# Patient Record
Sex: Male | Born: 1975
Health system: Southern US, Community
[De-identification: ages and names within clinical notes are randomized; demographics above are authoritative.]

## PROBLEM LIST (undated history)

## (undated) ENCOUNTER — Emergency Department (HOSPITAL_COMMUNITY): Disposition: A | Discharge status: Home / Self Care | Payer: Self-pay

## (undated) DIAGNOSIS — J40 Bronchitis, not specified as acute or chronic: Secondary | ICD-10-CM

## (undated) DIAGNOSIS — I10 Essential (primary) hypertension: Secondary | ICD-10-CM

## (undated) DIAGNOSIS — I214 Non-ST elevation (NSTEMI) myocardial infarction: Secondary | ICD-10-CM

## (undated) HISTORY — PX: DENTAL SURGERY: SHX609

---

## 2009-08-31 ENCOUNTER — Emergency Department (HOSPITAL_COMMUNITY): Admission: EM | Admit: 2009-08-31 | Discharge: 2009-08-31 | Payer: Self-pay | Admitting: Family Medicine

## 2010-04-17 ENCOUNTER — Emergency Department (HOSPITAL_COMMUNITY): Admission: EM | Admit: 2010-04-17 | Discharge: 2010-04-17 | Payer: Self-pay | Admitting: Family Medicine

## 2012-09-30 ENCOUNTER — Encounter (HOSPITAL_COMMUNITY): Payer: Self-pay | Admitting: Emergency Medicine

## 2012-09-30 ENCOUNTER — Emergency Department (HOSPITAL_COMMUNITY)
Admission: EM | Admit: 2012-09-30 | Discharge: 2012-10-01 | Disposition: A | Discharge status: Home / Self Care | Payer: Self-pay | Attending: Emergency Medicine | Admitting: Emergency Medicine

## 2012-09-30 DIAGNOSIS — Z79899 Other long term (current) drug therapy: Secondary | ICD-10-CM | POA: Insufficient documentation

## 2012-09-30 DIAGNOSIS — F17200 Nicotine dependence, unspecified, uncomplicated: Secondary | ICD-10-CM | POA: Insufficient documentation

## 2012-09-30 DIAGNOSIS — R0789 Other chest pain: Secondary | ICD-10-CM | POA: Insufficient documentation

## 2012-09-30 DIAGNOSIS — I10 Essential (primary) hypertension: Secondary | ICD-10-CM | POA: Insufficient documentation

## 2012-09-30 DIAGNOSIS — R0781 Pleurodynia: Secondary | ICD-10-CM

## 2012-09-30 HISTORY — DX: Essential (primary) hypertension: I10

## 2012-09-30 NOTE — ED Notes (Signed)
Pt c/o pain to L ribs x 1 month, intermittent, worse with deep breath, movement. Worse with palpation. Pt states he was able to palpate a "knot" this evening in rib area. Pt denies injury.

## 2012-10-01 ENCOUNTER — Emergency Department (HOSPITAL_COMMUNITY): Payer: Self-pay

## 2012-10-01 MED ORDER — NAPROXEN 500 MG PO TABS
500.0000 mg | ORAL_TABLET | Freq: Once | ORAL | Status: AC
  Administered 2012-10-01: 500 mg via ORAL
  Filled 2012-10-01: qty 1

## 2012-10-01 MED ORDER — NAPROXEN 500 MG PO TABS: 500.0000 mg | ORAL_TABLET | Freq: Two times a day (BID) | ORAL | Status: DC

## 2012-10-01 NOTE — ED Provider Notes (Signed)
History     CSN: [id number]  Arrival date & time 09/30/12  2331   First MD Initiated Contact with Patient 09/30/12 2357      Chief Complaint  Patient presents with  . Rib pain     (Consider location/radiation/quality/duration/timing/severity/associated sxs/prior treatment) HPI Comments: 36 year old male with history of hypertension presents with a complaint of sharp left rib pain. Onset was one month ago, has been persistent throughout the month though it is intermittent throughout the day. It is worse with deep breathing, worse with rotation at the hips and flexion at the hips, worse with palpation of the chest wall but not associated with coughing, trauma, fever, swelling of the legs or any other complaints. He denies a history of diabetes, cancer, DVT, HIV, steroid use, trauma.  The history is provided by the patient and a relative.    Past Medical History  Diagnosis Date  . Hypertension     History reviewed. No pertinent past surgical history.  No family history on file.  History  Substance Use Topics  . Smoking status: Current Every Day Smoker  . Smokeless tobacco: Not on file  . Alcohol Use: No      Review of Systems  All other systems reviewed and are negative.    Allergies  Review of patient's allergies indicates no known allergies.  Home Medications   Current Outpatient Rx  Name  Route  Sig  Dispense  Refill  . CLONIDINE HCL 0.2 MG PO TABS   Oral   Take 0.2 mg by mouth 2 (two) times daily.         Marland Kitchen DILTIAZEM HCL ER 240 MG PO CP24   Oral   Take 240 mg by mouth daily.         Marland Kitchen HYDROCHLOROTHIAZIDE 25 MG PO TABS   Oral   Take 25 mg by mouth daily.         Marland Kitchen LISINOPRIL 20 MG PO TABS   Oral   Take 20 mg by mouth daily.         Marland Kitchen NAPROXEN 500 MG PO TABS   Oral   Take 1 tablet (500 mg total) by mouth 2 (two) times daily with a meal.   30 tablet   0     BP 161/104  Pulse 91  Temp 98.2 F (36.8 C) (Oral)  Resp 18  SpO2  99%  Physical Exam  Nursing note and vitals reviewed. Constitutional: He appears well-developed and well-nourished. No distress.  HENT:  Head: Normocephalic and atraumatic.  Mouth/Throat: Oropharynx is clear and moist. No oropharyngeal exudate.  Eyes: Conjunctivae normal and EOM are normal. Pupils are equal, round, and reactive to light. Right eye exhibits no discharge. Left eye exhibits no discharge. No scleral icterus.  Neck: Normal range of motion. Neck supple. No JVD present. No thyromegaly present.  Cardiovascular: Normal rate, regular rhythm, normal heart sounds and intact distal pulses.  Exam reveals no gallop and no friction rub.   No murmur heard. Pulmonary/Chest: Effort normal and breath sounds normal. No respiratory distress. He has no wheezes. He has no rales. He exhibits tenderness ( Reproducible chest wall tenderness over the anterior axillary line around the fifth rib, this extends anteriorly, no crepitance, no bruising, no rashes).  Abdominal: Soft. Bowel sounds are normal. He exhibits no distension and no mass. There is no tenderness.  Musculoskeletal: Normal range of motion. He exhibits no edema and no tenderness.  Lymphadenopathy:    He has no cervical adenopathy.  Neurological: He is alert. Coordination normal.  Skin: Skin is warm and dry. No rash noted. No erythema.  Psychiatric: He has a normal mood and affect. His behavior is normal.    ED Course  Procedures (including critical care time)  Labs Reviewed - No data to display Dg Ribs Unilateral W/chest Left  10/01/2012  *RADIOLOGY REPORT*  Clinical Data: Left rib pain.  LEFT RIBS AND CHEST - 3+ VIEW  Comparison: None.  Findings:  Cardiopericardial silhouette within normal limits. Mediastinal contours normal. Trachea midline.  No airspace disease or effusion.  No displaced rib fracture.  Cutaneous marker placed over the left lateral tenth rib.  No abnormality is present in this region.  IMPRESSION: Negative.   Original  Report Authenticated By: Andreas Newport, M.D.      1. Rib pain on left side       MDM  Overall the patient is well-appearing, he has hypertension but no signs of hypoxia or tachycardia. We'll admit the ribs to rule out fracture or cancerous lesions. Naprosyn given, patient declines parenteral medications.  The patient has had negative x-rays, no signs of fracture, no pneumonia, patient appears stable for discharge. He has medications for his hypertension including diltiazem, hydrochlorothiazide, lisinopril and clonidine.  Will add Naprosyn for musculoskeletal pain.   Vida Roller, MD 10/01/12 [phone]

## 2012-10-01 NOTE — ED Notes (Signed)
Patient transported to X-ray 

## 2013-06-23 ENCOUNTER — Encounter (HOSPITAL_COMMUNITY): Payer: Self-pay | Admitting: Emergency Medicine

## 2013-06-23 ENCOUNTER — Emergency Department (INDEPENDENT_AMBULATORY_CARE_PROVIDER_SITE_OTHER): Payer: Self-pay

## 2013-06-23 ENCOUNTER — Emergency Department (INDEPENDENT_AMBULATORY_CARE_PROVIDER_SITE_OTHER)
Admission: EM | Admit: 2013-06-23 | Discharge: 2013-06-23 | Disposition: A | Discharge status: Home / Self Care | Payer: Self-pay | Source: Home / Self Care | Attending: Family Medicine | Admitting: Family Medicine

## 2013-06-23 DIAGNOSIS — S8010XA Contusion of unspecified lower leg, initial encounter: Secondary | ICD-10-CM

## 2013-06-23 DIAGNOSIS — S8011XA Contusion of right lower leg, initial encounter: Secondary | ICD-10-CM

## 2013-06-23 MED ORDER — TRAMADOL HCL 50 MG PO TABS: 50.0000 mg | ORAL_TABLET | Freq: Four times a day (QID) | ORAL | Status: DC | PRN

## 2013-06-23 MED ORDER — CEPHALEXIN 500 MG PO CAPS: 500.0000 mg | ORAL_CAPSULE | Freq: Two times a day (BID) | ORAL | Status: DC

## 2013-06-23 MED ORDER — TRIAMCINOLONE ACETONIDE 0.5 % EX OINT: TOPICAL_OINTMENT | Freq: Two times a day (BID) | CUTANEOUS | Status: DC

## 2013-06-23 NOTE — ED Provider Notes (Signed)
CSN: [id number]     Arrival date & time 06/23/13  0902 History   First MD Initiated Contact with Patient 06/23/13 [phone]     Chief Complaint  Patient presents with  . Leg Swelling   (Consider location/radiation/quality/duration/timing/severity/associated sxs/prior Treatment) HPI Comments: 37 year old male smoker. Here complaining of  in the area of swelling, itchiness and pain in his anterior lower right leg. States symptoms have been present for 2 weeks. Has used hydrocortisone for itchiness. Denies known trauma. No drainage.    Past Medical History  Diagnosis Date  . Hypertension    History reviewed. No pertinent past surgical history. History reviewed. No pertinent family history. History  Substance Use Topics  . Smoking status: Current Every Day Smoker  . Smokeless tobacco: Not on file  . Alcohol Use: No    Review of Systems  Constitutional: Negative for fever, chills and fatigue.  Skin:       As per HPI    Allergies  Review of patient's allergies indicates no known allergies.  Home Medications   Current Outpatient Rx  Name  Route  Sig  Dispense  Refill  . hydrochlorothiazide (HYDRODIURIL) 25 MG tablet   Oral   Take 25 mg by mouth daily.         Marland Kitchen lisinopril (PRINIVIL,ZESTRIL) 20 MG tablet   Oral   Take 20 mg by mouth daily.         . cephALEXin (KEFLEX) 500 MG capsule   Oral   Take 1 capsule (500 mg total) by mouth 2 (two) times daily.   14 capsule   0   . cloNIDine (CATAPRES) 0.2 MG tablet   Oral   Take 0.2 mg by mouth 2 (two) times daily.         Marland Kitchen diltiazem (DILACOR XR) 240 MG 24 hr capsule   Oral   Take 240 mg by mouth daily.         . naproxen (NAPROSYN) 500 MG tablet   Oral   Take 1 tablet (500 mg total) by mouth 2 (two) times daily with a meal.   30 tablet   0   . traMADol (ULTRAM) 50 MG tablet   Oral   Take 1 tablet (50 mg total) by mouth every 6 (six) hours as needed for pain.   15 tablet   0   . triamcinolone ointment  (KENALOG) 0.5 %   Topical   Apply topically 2 (two) times daily.   30 g   0    BP 154/104  Pulse 87  Temp(Src) 98.2 F (36.8 C) (Oral)  Resp 18  SpO2 96% Physical Exam  Nursing note and vitals reviewed. Constitutional: He is oriented to person, place, and time. He appears well-developed and well-nourished. No distress.  HENT:  Head: Normocephalic and atraumatic.  Cardiovascular: Normal heart sounds.   Pulmonary/Chest: Breath sounds normal.  Musculoskeletal: Normal range of motion.  Neurological: He is alert and oriented to person, place, and time.  Skin: He is not diaphoretic.  Right lower leg: there is an area in anterior mid tibia od about 1.5 cm with fluctuation and tenderness no changes in skin color.     ED Course  INCISION AND DRAINAGE Performed by: Sharin Grave Authorized by: Sharin Grave Consent: Verbal consent obtained. Risks and benefits: risks, benefits and alternatives were discussed Consent given by: patient Patient understanding: patient states understanding of the procedure being performed Patient consent: the patient's understanding of the procedure matches consent given Type: hematoma Body  area: lower extremity Location details: right leg Anesthesia: local infiltration Local anesthetic: lidocaine 1% with epinephrine Anesthetic total: 2 ml Scalpel size: 11 Incision type: single straight Complexity: simple Drainage: serosanguinous (dark clotted blood ) Drainage amount: scant Wound treatment: 1 loose 3.0 prolene simple suture place to aproximate borders. Patient tolerance: Patient tolerated the procedure well with no immediate complications. Comments: Antibiotic ointment and dry dressing applied on top.   (including critical care time) Labs Review Labs Reviewed - No data to display Imaging Review Dg Tibia/fibula Right  06/23/2013   *RADIOLOGY REPORT*  Clinical Data: Swelling mid to lower right leg.  Pain only if pressure applied.  No  known injury.  RIGHT TIBIA AND FIBULA - 2 VIEW  Comparison: None.  Findings: No plain film evidence of bony abnormality involving the right tibia or fibula.  Bony overgrowth talar neck/head.  IMPRESSION: No plain film evidence of bony abnormality involving the right tibia or fibula.  Bony overgrowth talar neck/head.   Original Report Authenticated By: Lacy Duverney, M.D.    MDM   1. Hematoma of leg, right, initial encounter    Prescribed tramadol, triamcinolone ointment and cephalexin. Supportive care and red flags that should prompt return to medical attention discussed with patient and provided in writing. Asked to return in 8-10 days for suture removal or earlier if redness swelling ir drainage.   Sharin Grave, MD 06/25/13 [phone]

## 2013-06-23 NOTE — ED Notes (Signed)
C/o right shin swelling for two weeks.   Hydrocortisone cream used for itching.  Denies injury to knee.   Patient states this is the first time this has happened.

## 2013-06-27 ENCOUNTER — Emergency Department (HOSPITAL_COMMUNITY): Payer: Self-pay

## 2013-06-27 ENCOUNTER — Encounter (HOSPITAL_COMMUNITY): Payer: Self-pay

## 2013-06-27 ENCOUNTER — Emergency Department (HOSPITAL_COMMUNITY)
Admission: EM | Admit: 2013-06-27 | Discharge: 2013-06-27 | Disposition: A | Discharge status: Home / Self Care | Payer: Self-pay | Attending: Emergency Medicine | Admitting: Emergency Medicine

## 2013-06-27 DIAGNOSIS — M545 Low back pain, unspecified: Secondary | ICD-10-CM | POA: Insufficient documentation

## 2013-06-27 DIAGNOSIS — F17200 Nicotine dependence, unspecified, uncomplicated: Secondary | ICD-10-CM | POA: Insufficient documentation

## 2013-06-27 DIAGNOSIS — Z79899 Other long term (current) drug therapy: Secondary | ICD-10-CM | POA: Insufficient documentation

## 2013-06-27 DIAGNOSIS — R110 Nausea: Secondary | ICD-10-CM | POA: Insufficient documentation

## 2013-06-27 DIAGNOSIS — I10 Essential (primary) hypertension: Secondary | ICD-10-CM | POA: Insufficient documentation

## 2013-06-27 DIAGNOSIS — R197 Diarrhea, unspecified: Secondary | ICD-10-CM | POA: Insufficient documentation

## 2013-06-27 DIAGNOSIS — N5089 Other specified disorders of the male genital organs: Secondary | ICD-10-CM | POA: Insufficient documentation

## 2013-06-27 DIAGNOSIS — R319 Hematuria, unspecified: Secondary | ICD-10-CM | POA: Insufficient documentation

## 2013-06-27 DIAGNOSIS — N509 Disorder of male genital organs, unspecified: Secondary | ICD-10-CM | POA: Insufficient documentation

## 2013-06-27 DIAGNOSIS — N419 Inflammatory disease of prostate, unspecified: Secondary | ICD-10-CM | POA: Insufficient documentation

## 2013-06-27 DIAGNOSIS — N453 Epididymo-orchitis: Secondary | ICD-10-CM | POA: Insufficient documentation

## 2013-06-27 LAB — CBC
HCT: 51 % (ref 39.0–52.0)
MCV: 88.5 fL (ref 78.0–100.0)
Platelets: 213 10*3/uL (ref 150–400)
RBC: 5.76 MIL/uL (ref 4.22–5.81)
WBC: 8.4 10*3/uL (ref 4.0–10.5)

## 2013-06-27 LAB — BASIC METABOLIC PANEL
CO2: 23 mEq/L (ref 19–32)
Chloride: 102 mEq/L (ref 96–112)
Creatinine, Ser: 1.01 mg/dL (ref 0.50–1.35)

## 2013-06-27 LAB — URINALYSIS, ROUTINE W REFLEX MICROSCOPIC
Glucose, UA: NEGATIVE mg/dL
Hgb urine dipstick: NEGATIVE
Specific Gravity, Urine: 1.028 (ref 1.005–1.030)
Urobilinogen, UA: 1 mg/dL (ref 0.0–1.0)
pH: 5.5 (ref 5.0–8.0)

## 2013-06-27 LAB — OCCULT BLOOD, POC DEVICE: Fecal Occult Bld: POSITIVE — AB

## 2013-06-27 MED ORDER — SODIUM CHLORIDE 0.9 % IV BOLUS (SEPSIS)
1000.0000 mL | Freq: Once | INTRAVENOUS | Status: AC
  Administered 2013-06-27: 1000 mL via INTRAVENOUS

## 2013-06-27 MED ORDER — SULFAMETHOXAZOLE-TRIMETHOPRIM 800-160 MG PO TABS: 1.0000 | ORAL_TABLET | Freq: Two times a day (BID) | ORAL | Status: DC

## 2013-06-27 MED ORDER — OXYCODONE-ACETAMINOPHEN 5-325 MG PO TABS
2.0000 | ORAL_TABLET | Freq: Once | ORAL | Status: AC
  Administered 2013-06-27: 2 via ORAL
  Filled 2013-06-27: qty 2

## 2013-06-27 MED ORDER — DIPHENHYDRAMINE HCL 50 MG/ML IJ SOLN
25.0000 mg | Freq: Once | INTRAMUSCULAR | Status: AC
  Administered 2013-06-27: 25 mg via INTRAVENOUS
  Filled 2013-06-27: qty 1

## 2013-06-27 MED ORDER — DOXYCYCLINE HYCLATE 100 MG IV SOLR
100.0000 mg | Freq: Once | INTRAVENOUS | Status: AC
  Administered 2013-06-27: 100 mg via INTRAVENOUS
  Filled 2013-06-27: qty 100

## 2013-06-27 MED ORDER — LEVOFLOXACIN 750 MG PO TABS: 750.0000 mg | ORAL_TABLET | Freq: Every day | ORAL | Status: DC

## 2013-06-27 MED ORDER — OXYCODONE-ACETAMINOPHEN 5-325 MG PO TABS: 1.0000 | ORAL_TABLET | Freq: Four times a day (QID) | ORAL | Status: DC | PRN

## 2013-06-27 MED ORDER — DEXTROSE 5 % IV SOLN
1.0000 g | Freq: Once | INTRAVENOUS | Status: AC
  Administered 2013-06-27: 1 g via INTRAVENOUS
  Filled 2013-06-27: qty 10

## 2013-06-27 NOTE — ED Notes (Signed)
Pt presents with lower back pain, abdominal pain, and groin pain. Denies injury to back. Denies nausea, vomiting. Pt says he has had diarrhea for 2-3 days. Denies urinary burning, frequency.

## 2013-06-27 NOTE — ED Provider Notes (Signed)
CSN: [id number]     Arrival date & time 06/27/13  1633 History   First MD Initiated Contact with Patient 06/27/13 1637     Chief Complaint  Patient presents with  . Abdominal Pain  . Back Pain  . Groin Pain   (Consider location/radiation/quality/duration/timing/severity/associated sxs/prior Treatment) The history is provided by the patient and medical records. No language interpreter was used.    Stephen West is a 37 y.o. male  with a hx of HTN presents to the Emergency Department complaining of gradual, persistent, progressively worsening left testicle pain beginning 4 days ago with associated swelling in the testicle.  Pt also with urinary hesitancy, constipation.  Pt reports the pain is excruciating and feels like pressure and radiates into the lower abd and low back.  He also endorses change in urine color and foul odor.  Pt is sexually active with the same male partner for the last 4 years.  Nothing makes the pain better and palpation of the testicle or bearing down for a BM makes the pain worse. Pt reports that his BM on Friday was very painful and after that he has has several loose stools per day without frank blood. Pt denies fever, chills, headache, neck pain, chest pain, SOB, vomiting, weakness, dizziness, syncope, dysuria, penile discharge.  Pt reports Hx of STD many years ago for which he was treated, but does not know which one.      Past Medical History  Diagnosis Date  . Hypertension    History reviewed. No pertinent past surgical history. No family history on file. History  Substance Use Topics  . Smoking status: Current Every Day Smoker  . Smokeless tobacco: Not on file  . Alcohol Use: No    Review of Systems  Constitutional: Negative for fever, diaphoresis, appetite change, fatigue and unexpected weight change.  HENT: Negative for mouth sores and neck stiffness.   Eyes: Negative for visual disturbance.  Respiratory: Negative for cough, chest tightness,  shortness of breath and wheezing.   Cardiovascular: Negative for chest pain.  Gastrointestinal: Positive for nausea, abdominal pain and diarrhea. Negative for vomiting and constipation.  Endocrine: Negative for polydipsia, polyphagia and polyuria.  Genitourinary: Positive for hematuria, scrotal swelling, difficulty urinating and testicular pain. Negative for dysuria, urgency and frequency.  Musculoskeletal: Positive for back pain (low back pain).  Skin: Negative for rash.  Allergic/Immunologic: Negative for immunocompromised state.  Neurological: Negative for syncope, light-headedness and headaches.  Hematological: Does not bruise/bleed easily.  Psychiatric/Behavioral: Negative for sleep disturbance. The patient is not nervous/anxious.     Allergies  Review of patient's allergies indicates no known allergies.  Home Medications   Current Outpatient Rx  Name  Route  Sig  Dispense  Refill  . cloNIDine (CATAPRES) 0.2 MG tablet   Oral   Take 0.2 mg by mouth 2 (two) times daily.         Marland Kitchen diltiazem (DILACOR XR) 240 MG 24 hr capsule   Oral   Take 240 mg by mouth daily.         . hydrochlorothiazide (HYDRODIURIL) 25 MG tablet   Oral   Take 25 mg by mouth daily.         Marland Kitchen ibuprofen (ADVIL,MOTRIN) 200 MG tablet   Oral   Take 200 mg by mouth every 6 (six) hours as needed for pain.         Marland Kitchen lisinopril (PRINIVIL,ZESTRIL) 20 MG tablet   Oral   Take 20 mg by mouth daily.         Marland Kitchen  naproxen (NAPROSYN) 500 MG tablet   Oral   Take 1 tablet (500 mg total) by mouth 2 (two) times daily with a meal.   30 tablet   0   . cephALEXin (KEFLEX) 500 MG capsule   Oral   Take 1 capsule (500 mg total) by mouth 2 (two) times daily.   14 capsule   0   . levofloxacin (LEVAQUIN) 750 MG tablet   Oral   Take 1 tablet (750 mg total) by mouth daily. X 10 days   10 tablet   0   . oxyCODONE-acetaminophen (PERCOCET/ROXICET) 5-325 MG per tablet   Oral   Take 1-2 tablets by mouth every 6  (six) hours as needed for pain.   21 tablet   0   . sulfamethoxazole-trimethoprim (SEPTRA DS) 800-160 MG per tablet   Oral   Take 1 tablet by mouth 2 (two) times daily.   28 tablet   0   . traMADol (ULTRAM) 50 MG tablet   Oral   Take 1 tablet (50 mg total) by mouth every 6 (six) hours as needed for pain.   15 tablet   0   . triamcinolone ointment (KENALOG) 0.5 %   Topical   Apply topically 2 (two) times daily.   30 g   0    BP 146/101  Pulse 76  Temp(Src) 98.2 F (36.8 C) (Oral)  Resp 18  Ht 6' (1.829 m)  Wt 220 lb (99.791 kg)  BMI 29.83 kg/m2  SpO2 98% Physical Exam  Nursing note and vitals reviewed. Constitutional: He appears well-developed and well-nourished. No distress.  Awake, alert, nontoxic appearance  HENT:  Head: Normocephalic and atraumatic.  Mouth/Throat: Oropharynx is clear and moist. No oropharyngeal exudate.  Eyes: Conjunctivae are normal. No scleral icterus.  Neck: Normal range of motion. Neck supple.  Cardiovascular: Normal rate, regular rhythm and intact distal pulses.   Pulmonary/Chest: Effort normal and breath sounds normal. No respiratory distress. He has no wheezes.  Abdominal: Soft. Bowel sounds are normal. He exhibits no mass. There is no tenderness. There is no rebound and no guarding. Hernia confirmed negative in the right inguinal area and confirmed negative in the left inguinal area.  Genitourinary: Penis normal. Rectal exam shows tenderness. Rectal exam shows no external hemorrhoid, no internal hemorrhoid, no fissure, no mass and anal tone normal. Prostate is enlarged (mildly) and tender. Right testis shows no mass, no swelling and no tenderness. Right testis is descended. Left testis shows swelling (mild) and tenderness (severe). Left testis shows no mass. Left testis is descended. No phimosis, paraphimosis, hypospadias, penile erythema or penile tenderness. No discharge found.  Mildly enlarged, very tender, boggy prostate.   Left testicle  with exquisite tenderness to palpation limiting exam.    Musculoskeletal: Normal range of motion. He exhibits no edema.  Lymphadenopathy:       Right: No inguinal adenopathy present.  Neurological: He is alert.  Speech is clear and goal oriented Moves extremities without ataxia  Skin: Skin is warm and dry. He is not diaphoretic.  Psychiatric: He has a normal mood and affect.    ED Course  Procedures (including critical care time) Labs Review Labs Reviewed  URINALYSIS, ROUTINE W REFLEX MICROSCOPIC - Abnormal; Notable for the following:    Color, Urine AMBER (*)    Bilirubin Urine SMALL (*)    All other components within normal limits  CBC - Abnormal; Notable for the following:    Hemoglobin 18.4 (*)    MCHC  36.1 (*)    All other components within normal limits  BASIC METABOLIC PANEL - Abnormal; Notable for the following:    Glucose, Bld 100 (*)    All other components within normal limits  OCCULT BLOOD, POC DEVICE - Abnormal; Notable for the following:    Fecal Occult Bld POSITIVE (*)    All other components within normal limits  GC/CHLAMYDIA PROBE AMP  RPR  HIV ANTIBODY (ROUTINE TESTING)   Imaging Review US Scrotum  06/27/2013   *RADIOLOGY REPORT*  Clinical Data:  Groin pain, abdominal pain  SCROTAL ULTRASOUND DOPPLER ULTRASOUND OF THE TESTICLES  Technique: Complete ultrasound examination of the testicles, epididymis, and other scrotal structures was performed.  Color and spectral Doppler ultrasound were also utilized to evaluate blood flow to the testicles.  Comparison:  None  Findings:  Right testis:  Normal in size and echogenicity measuring 4.7 x 2.8 x 3.2 cm.  Normal color Doppler flow and spectral wave forms.  Left testis:  Normal in size at 4.5 x 3.2 x 2.8 cm.  Left testicle is slightly striated compared to the right.  Left testicle also demonstrates increased arterial velocities compared to the right. There are normal spectral arterial and venous wave forms although again  increased compared to the right.  Right epididymis:  Normal in size and appearance.  Left epididymis:  Mildly enlarged and hyperemic.  Hydrocele:  Absent  Varicocele:  Absent  IMPRESSION:  1. Slightly hyperemic and heterogeneous left testicle suggest orchitis. 2. Mildly hyperemic left epididymis suggests left  epididymitis. 3. No evidence of torsion of the left or right testicle.   Original Report Authenticated By: Genevive Bi, M.D.   [place] Art/ven Flow Abd Pelv Doppler  06/27/2013   *RADIOLOGY REPORT*  Clinical Data:  Groin pain, abdominal pain  SCROTAL ULTRASOUND DOPPLER ULTRASOUND OF THE TESTICLES  Technique: Complete ultrasound examination of the testicles, epididymis, and other scrotal structures was performed.  Color and spectral Doppler ultrasound were also utilized to evaluate blood flow to the testicles.  Comparison:  None  Findings:  Right testis:  Normal in size and echogenicity measuring 4.7 x 2.8 x 3.2 cm.  Normal color Doppler flow and spectral wave forms.  Left testis:  Normal in size at 4.5 x 3.2 x 2.8 cm.  Left testicle is slightly striated compared to the right.  Left testicle also demonstrates increased arterial velocities compared to the right. There are normal spectral arterial and venous wave forms although again increased compared to the right.  Right epididymis:  Normal in size and appearance.  Left epididymis:  Mildly enlarged and hyperemic.  Hydrocele:  Absent  Varicocele:  Absent  IMPRESSION:  1. Slightly hyperemic and heterogeneous left testicle suggest orchitis. 2. Mildly hyperemic left epididymis suggests left  epididymitis. 3. No evidence of torsion of the left or right testicle.   Original Report Authenticated By: Genevive Bi, M.D.    MDM   1. Prostatitis   2. Orchitis and epididymitis   3. HTN (hypertension)    TAIM WURM presents with testicular pain and tender, boggy prostate on DRE.  Significant concern for prostatitis/orchitis/epididimytis vs less likely  torsion.  Will obtain labs, UA and [place] of scrotum.  CBC, BMP unremarkable. Urinalysis with small amount of very minimal to no hemoglobin leukocytes or nitrites.  Patient age 19 and with the same partner for 4 years with a history of STD with this partner. Less likely caused by gonorrhea or chlamydia however, cultures were obtained along with RPR  and HIV.  Ultrasound of scrotum without evidence of torsion but suggestion of left orchitis and epididymitis.  Labs and imaging reviewed. Patient will be treated with levofloxacin 500 mg by mouth daily for 10 days. Pt denies a hx of renal issues.  Will give vicodin for pain. Patient has been advised to rest, ice and scrotal support to help he is pain. Recommended purchasing jockstrap.  Pt presentation also concerning for acute, bacterial prostatitis with low back pain, urinary hesitancy and myalgias.  Pt is not febrile here and does not endorse fever at home.  DRE with tender boggy prostate.  Pt will be d/c home with Bactrim for 14 days.  Pt given rocephin and doxycycline IV here in the department along with fluid bolus.    Patient is hemodynamically stable and in no acute distress prior to discharge. Pt noted to have HTN here in the department and the same has been noted in the past.  Recommended f/u with PCP for this.  Pt is without CP, SOB or evidence of EOD.   Patient is agreeable to plan and will followup with urology for re-evaluation this week.  I have also discussed reasons to return immediately to the ER.  Patient expresses understanding and agrees with plan.  BP 146/101  Pulse 76  Temp(Src) 98.2 F (36.8 C) (Oral)  Resp 18  Ht 6' (1.829 m)  Wt 220 lb (99.791 kg)  BMI 29.83 kg/m2  SpO2 98%        Dierdre Forth, PA-C 06/28/13 2130

## 2013-06-28 LAB — HIV ANTIBODY (ROUTINE TESTING W REFLEX): HIV: NONREACTIVE

## 2013-06-28 LAB — GC/CHLAMYDIA PROBE AMP
CT Probe RNA: NEGATIVE
GC Probe RNA: NEGATIVE

## 2013-06-28 NOTE — ED Provider Notes (Signed)
Medical screening examination/treatment/procedure(s) were performed by non-physician practitioner and as supervising physician I was immediately available for consultation/collaboration.    Sharde Gover R Rayshard Schirtzinger, MD 06/28/13 0027 

## 2013-12-06 ENCOUNTER — Emergency Department (HOSPITAL_COMMUNITY): Payer: BC Managed Care – PPO

## 2013-12-06 ENCOUNTER — Emergency Department (HOSPITAL_COMMUNITY)
Admission: EM | Admit: 2013-12-06 | Discharge: 2013-12-06 | Disposition: A | Discharge status: Home / Self Care | Payer: BC Managed Care – PPO | Attending: Emergency Medicine | Admitting: Emergency Medicine

## 2013-12-06 ENCOUNTER — Encounter (HOSPITAL_COMMUNITY): Payer: Self-pay | Admitting: Emergency Medicine

## 2013-12-06 DIAGNOSIS — F17200 Nicotine dependence, unspecified, uncomplicated: Secondary | ICD-10-CM | POA: Insufficient documentation

## 2013-12-06 DIAGNOSIS — R109 Unspecified abdominal pain: Secondary | ICD-10-CM

## 2013-12-06 DIAGNOSIS — R1032 Left lower quadrant pain: Secondary | ICD-10-CM | POA: Insufficient documentation

## 2013-12-06 DIAGNOSIS — Z79899 Other long term (current) drug therapy: Secondary | ICD-10-CM | POA: Insufficient documentation

## 2013-12-06 DIAGNOSIS — I10 Essential (primary) hypertension: Secondary | ICD-10-CM | POA: Insufficient documentation

## 2013-12-06 LAB — CBC WITH DIFFERENTIAL/PLATELET
Basophils Absolute: 0 10*3/uL (ref 0.0–0.1)
Basophils Relative: 0 % (ref 0–1)
Eosinophils Absolute: 0.2 10*3/uL (ref 0.0–0.7)
Eosinophils Relative: 3 % (ref 0–5)
HCT: 53.1 % — ABNORMAL HIGH (ref 39.0–52.0)
Hemoglobin: 18.8 g/dL — ABNORMAL HIGH (ref 13.0–17.0)
Lymphocytes Relative: 33 % (ref 12–46)
Lymphs Abs: 2.1 10*3/uL (ref 0.7–4.0)
MCH: 31.9 pg (ref 26.0–34.0)
MCHC: 35.4 g/dL (ref 30.0–36.0)
MCV: 90.2 fL (ref 78.0–100.0)
Monocytes Absolute: 0.6 10*3/uL (ref 0.1–1.0)
Monocytes Relative: 9 % (ref 3–12)
Neutro Abs: 3.5 10*3/uL (ref 1.7–7.7)
Neutrophils Relative %: 55 % (ref 43–77)
Platelets: 209 10*3/uL (ref 150–400)
RBC: 5.89 MIL/uL — ABNORMAL HIGH (ref 4.22–5.81)
RDW: 13.7 % (ref 11.5–15.5)
WBC: 6.4 10*3/uL (ref 4.0–10.5)

## 2013-12-06 LAB — URINALYSIS, ROUTINE W REFLEX MICROSCOPIC
Bilirubin Urine: NEGATIVE
Glucose, UA: NEGATIVE mg/dL
Hgb urine dipstick: NEGATIVE
Ketones, ur: NEGATIVE mg/dL
Leukocytes, UA: NEGATIVE
Nitrite: NEGATIVE
Protein, ur: NEGATIVE mg/dL
Specific Gravity, Urine: 1.029 (ref 1.005–1.030)
Urobilinogen, UA: 1 mg/dL (ref 0.0–1.0)
pH: 6 (ref 5.0–8.0)

## 2013-12-06 LAB — COMPREHENSIVE METABOLIC PANEL
ALT: 27 U/L (ref 0–53)
AST: 28 U/L (ref 0–37)
Albumin: 4.4 g/dL (ref 3.5–5.2)
Alkaline Phosphatase: 78 U/L (ref 39–117)
BUN: 9 mg/dL (ref 6–23)
CO2: 29 mEq/L (ref 19–32)
Calcium: 9.7 mg/dL (ref 8.4–10.5)
Chloride: 100 mEq/L (ref 96–112)
Creatinine, Ser: 0.89 mg/dL (ref 0.50–1.35)
GFR calc Af Amer: >90 mL/min (ref >90)
GFR calc non Af Amer: >90 mL/min (ref >90)
Glucose, Bld: 107 mg/dL — ABNORMAL HIGH (ref 70–99)
Potassium: 4.1 mEq/L (ref 3.7–5.3)
Sodium: 141 mEq/L (ref 137–147)
Total Bilirubin: 0.4 mg/dL (ref 0.3–1.2)
Total Protein: 7.6 g/dL (ref 6.0–8.3)

## 2013-12-06 MED ORDER — HYDROMORPHONE HCL PF 1 MG/ML IJ SOLN
1.0000 mg | Freq: Once | INTRAMUSCULAR | Status: AC
  Administered 2013-12-06: 1 mg via INTRAVENOUS
  Filled 2013-12-06: qty 1

## 2013-12-06 MED ORDER — LISINOPRIL 20 MG PO TABS: 20.0000 mg | ORAL_TABLET | Freq: Every day | ORAL | Status: DC

## 2013-12-06 MED ORDER — SODIUM CHLORIDE 0.9 % IV BOLUS (SEPSIS)
1000.0000 mL | Freq: Once | INTRAVENOUS | Status: AC
  Administered 2013-12-06: 1000 mL via INTRAVENOUS

## 2013-12-06 MED ORDER — ONDANSETRON HCL 4 MG/2ML IJ SOLN
4.0000 mg | Freq: Once | INTRAMUSCULAR | Status: AC
  Administered 2013-12-06: 4 mg via INTRAVENOUS
  Filled 2013-12-06: qty 2

## 2013-12-06 MED ORDER — IOHEXOL 300 MG/ML  SOLN
50.0000 mL | Freq: Once | INTRAMUSCULAR | Status: AC | PRN
  Administered 2013-12-06: 50 mL via ORAL

## 2013-12-06 MED ORDER — CLONIDINE HCL 0.2 MG PO TABS: 0.2000 mg | ORAL_TABLET | Freq: Two times a day (BID) | ORAL | Status: DC

## 2013-12-06 MED ORDER — DILTIAZEM HCL ER 240 MG PO CP24: 240.0000 mg | ORAL_CAPSULE | Freq: Every day | ORAL | Status: DC

## 2013-12-06 MED ORDER — IOHEXOL 300 MG/ML  SOLN
100.0000 mL | Freq: Once | INTRAMUSCULAR | Status: AC | PRN
  Administered 2013-12-06: 100 mL via INTRAVENOUS

## 2013-12-06 MED ORDER — TRAMADOL HCL 50 MG PO TABS: 50.0000 mg | ORAL_TABLET | Freq: Four times a day (QID) | ORAL | Status: DC | PRN

## 2013-12-06 MED ORDER — HYDROCHLOROTHIAZIDE 25 MG PO TABS: 25.0000 mg | ORAL_TABLET | Freq: Every day | ORAL | Status: DC

## 2013-12-06 NOTE — Discharge Instructions (Signed)
Abdominal Pain, Adult °Many things can cause abdominal pain. Usually, abdominal pain is not caused by a disease and will improve without treatment. It can often be observed and treated at home. Your health care provider will do a physical exam and possibly order blood tests and X-rays to help determine the seriousness of your pain. However, in many cases, more time must pass before a clear cause of the pain can be found. Before that point, your health care provider may not know if you need more testing or further treatment. °HOME CARE INSTRUCTIONS  °Monitor your abdominal pain for any changes. The following actions may help to alleviate any discomfort you are experiencing: °· Only take over-the-counter or prescription medicines as directed by your health care provider. °· Do not take laxatives unless directed to do so by your health care provider. °· Try a clear liquid diet (broth, tea, or water) as directed by your health care provider. Slowly move to a bland diet as tolerated. °SEEK MEDICAL CARE IF: °· You have unexplained abdominal pain. °· You have abdominal pain associated with nausea or diarrhea. °· You have pain when you urinate or have a bowel movement. °· You experience abdominal pain that wakes you in the night. °· You have abdominal pain that is worsened or improved by eating food. °· You have abdominal pain that is worsened with eating fatty foods. °SEEK IMMEDIATE MEDICAL CARE IF:  °· Your pain does not go away within 2 hours. °· You have a fever. °· You keep throwing up (vomiting). °· Your pain is felt only in portions of the abdomen, such as the right side or the left lower portion of the abdomen. °· You pass bloody or black tarry stools. °MAKE SURE YOU: °· Understand these instructions.   °· Will watch your condition.   °· Will get help right away if you are not doing well or get worse.   °Document Released: 07/17/2005 Document Revised: 07/28/2013 Document Reviewed: 06/16/2013 °[employer]® Patient  Information ©2014 [employer]. ° °

## 2013-12-06 NOTE — ED Notes (Signed)
Patient is alert and oriented x3.  He is complaining of mid abdominal pain that started  Last Friday and has gotten worse over the weekend.  He denies any past history of this  Issue.  Currently he rates his pain 10 of 10 constant without nausea.

## 2013-12-06 NOTE — ED Provider Notes (Signed)
CSN: [id number]     Arrival date & time 12/06/13  1936 History   First MD Initiated Contact with Patient 12/06/13 2009     Chief Complaint  Patient presents with  . Abdominal Pain     (Consider location/radiation/quality/duration/timing/severity/associated sxs/prior Treatment) HPI  [age] y male with abdominal pain. Gradual onset Friday and progressively worsening. Pain is in the mid abdomen to the left side. Constant and without any appreciable exacerbating or relieving factors. Mild nausea, no vomiting. No diarrhea. No urinary complaints. No fevers or chills. No past history of similar symptoms. No significant surgical history.  Past Medical History  Diagnosis Date  . Hypertension    History reviewed. No pertinent past surgical history. History reviewed. No pertinent family history. History  Substance Use Topics  . Smoking status: Current Every Day Smoker  . Smokeless tobacco: Not on file  . Alcohol Use: Yes    Review of Systems  All systems reviewed and negative, other than as noted in HPI.   Allergies  Review of patient's allergies indicates no known allergies.  Home Medications   Current Outpatient Rx  Name  Route  Sig  Dispense  Refill  . acetaminophen (TYLENOL) 500 MG tablet   Oral   Take 1,000 mg by mouth once.         . cloNIDine (CATAPRES) 0.2 MG tablet   Oral   Take 0.2 mg by mouth 2 (two) times daily.         Marland Kitchen. diltiazem (DILACOR XR) 240 MG 24 hr capsule   Oral   Take 240 mg by mouth daily.         . hydrochlorothiazide (HYDRODIURIL) 25 MG tablet   Oral   Take 25 mg by mouth daily.         Marland Kitchen. lisinopril (PRINIVIL,ZESTRIL) 20 MG tablet   Oral   Take 20 mg by mouth daily.         . traMADol (ULTRAM) 50 MG tablet   Oral   Take 1 tablet (50 mg total) by mouth every 6 (six) hours as needed.   15 tablet   0    BP 194/134  Pulse 78  Temp(Src) 98 F (36.7 C) (Oral)  Resp 20  Ht 6' (1.829 m)  Wt 215 lb (97.523 kg)  BMI 29.15 kg/m2   SpO2 98% Physical Exam  Nursing note and vitals reviewed. Constitutional: He appears well-developed and well-nourished. No distress.  HENT:  Head: Normocephalic and atraumatic.  Eyes: Conjunctivae are normal. Right eye exhibits no discharge. Left eye exhibits no discharge.  Neck: Neck supple.  Cardiovascular: Normal rate, regular rhythm and normal heart sounds.  Exam reveals no gallop and no friction rub.   No murmur heard. Pulmonary/Chest: Effort normal and breath sounds normal. No respiratory distress.  Abdominal: Soft. He exhibits no distension. There is tenderness. There is no rebound and no guarding.  Suprapubic and LLQ tenderness w/o rebound or guarding  Genitourinary:  No cva tenderness  Musculoskeletal: He exhibits no edema and no tenderness.  Neurological: He is alert.  Skin: Skin is warm and dry.  Psychiatric: He has a normal mood and affect. His behavior is normal. Thought content normal.    All systems reviewed and negative, other than as noted in HPI.   ED Course  Procedures (including critical care time) Labs Review Labs Reviewed  COMPREHENSIVE METABOLIC PANEL - Abnormal; Notable for the following:    Glucose, Bld 107 (*)    All other components within normal  limits  CBC WITH DIFFERENTIAL - Abnormal; Notable for the following:    RBC 5.89 (*)    Hemoglobin 18.8 (*)    HCT 53.1 (*)    All other components within normal limits  URINALYSIS, ROUTINE W REFLEX MICROSCOPIC - Abnormal; Notable for the following:    Color, Urine AMBER (*)    All other components within normal limits   Imaging Review Ct Abdomen Pelvis W Contrast  12/06/2013   CLINICAL DATA:  Mid abdominal pain.  EXAM: CT ABDOMEN AND PELVIS WITH CONTRAST  TECHNIQUE: Multidetector CT imaging of the abdomen and pelvis was performed using the standard protocol following bolus administration of intravenous contrast.  CONTRAST:  50mL OMNIPAQUE IOHEXOL 300 MG/ML SOLN, OMNIPAQUE IOHEXOL 300 MG/ML SOLN   COMPARISON:  None.  FINDINGS: Linear areas of scarring or subsegmental atelectasis in the lung bases. Heart is normal size. No effusions.  Liver, gallbladder, spleen, pancreas, adrenals and kidneys are normal.  Appendix is visualized and is normal. A focus of high density material noted within the appendix could represent a small amount of contrast or a small appendicolith. At any rate, there are no changes of appendicitis. Small bowel is decompressed. Large bowel grossly unremarkable. No free fluid, free air or adenopathy. Aorta and iliac vessels are normal caliber.  No acute bony abnormality.  IMPRESSION: No acute intra-abdominal abnormality.   Electronically Signed   By: Charlett Nose M.D.   On: 12/06/2013 22:18    EKG Interpretation   None       MDM   Final diagnoses:  Abdominal pain   38 year old male with abdominal pain. Now completely resolved. Workup has been fairly unremarkable. Patient with significant hypertension. Has been off his medicines and does not have any refills. Prescriptions for hydrochlorothiazide, lisinopril, diltiazem and clonidine provided.. Pain medication. Discussed the need to followup with PCP or establish care as soon as possible to discuss further management.    Raeford Razor, MD 12/11/13 [phone]

## 2013-12-30 ENCOUNTER — Encounter: Payer: Self-pay | Admitting: Family Medicine

## 2013-12-30 ENCOUNTER — Ambulatory Visit (INDEPENDENT_AMBULATORY_CARE_PROVIDER_SITE_OTHER): Payer: BC Managed Care – PPO | Admitting: Family Medicine

## 2013-12-30 VITALS — BP 178/120 | HR 73 | Temp 98.6°F | Wt 206.0 lb

## 2013-12-30 DIAGNOSIS — R51 Headache: Secondary | ICD-10-CM

## 2013-12-30 DIAGNOSIS — Z716 Tobacco abuse counseling: Secondary | ICD-10-CM

## 2013-12-30 DIAGNOSIS — R1084 Generalized abdominal pain: Secondary | ICD-10-CM

## 2013-12-30 DIAGNOSIS — Z1329 Encounter for screening for other suspected endocrine disorder: Secondary | ICD-10-CM

## 2013-12-30 DIAGNOSIS — R4589 Other symptoms and signs involving emotional state: Secondary | ICD-10-CM | POA: Insufficient documentation

## 2013-12-30 DIAGNOSIS — R350 Frequency of micturition: Secondary | ICD-10-CM

## 2013-12-30 DIAGNOSIS — F17200 Nicotine dependence, unspecified, uncomplicated: Secondary | ICD-10-CM

## 2013-12-30 DIAGNOSIS — R739 Hyperglycemia, unspecified: Secondary | ICD-10-CM

## 2013-12-30 DIAGNOSIS — G8929 Other chronic pain: Secondary | ICD-10-CM

## 2013-12-30 DIAGNOSIS — I10 Essential (primary) hypertension: Secondary | ICD-10-CM

## 2013-12-30 DIAGNOSIS — Z7689 Persons encountering health services in other specified circumstances: Secondary | ICD-10-CM

## 2013-12-30 DIAGNOSIS — R7309 Other abnormal glucose: Secondary | ICD-10-CM

## 2013-12-30 DIAGNOSIS — R519 Headache, unspecified: Secondary | ICD-10-CM

## 2013-12-30 DIAGNOSIS — Z7189 Other specified counseling: Secondary | ICD-10-CM

## 2013-12-30 DIAGNOSIS — Z13220 Encounter for screening for lipoid disorders: Secondary | ICD-10-CM

## 2013-12-30 LAB — COMPREHENSIVE METABOLIC PANEL
ALBUMIN: 4.6 g/dL (ref 3.5–5.2)
ALT: 19 U/L (ref 0–53)
AST: 22 U/L (ref 0–37)
Alkaline Phosphatase: 78 U/L (ref 39–117)
BUN: 7 mg/dL (ref 6–23)
CALCIUM: 9.5 mg/dL (ref 8.4–10.5)
CHLORIDE: 102 meq/L (ref 96–112)
CO2: 31 meq/L (ref 19–32)
Creat: 1.08 mg/dL (ref 0.50–1.35)
GLUCOSE: 92 mg/dL (ref 70–99)
POTASSIUM: 3.9 meq/L (ref 3.5–5.3)
Sodium: 140 mEq/L (ref 135–145)
Total Bilirubin: 0.9 mg/dL (ref 0.2–1.2)
Total Protein: 7.1 g/dL (ref 6.0–8.3)

## 2013-12-30 LAB — LIPID PANEL
CHOL/HDL RATIO: 2 ratio
CHOLESTEROL: 143 mg/dL (ref 0–200)
HDL: 70 mg/dL (ref >39)
LDL Cholesterol: 61 mg/dL (ref 0–99)
Triglycerides: 62 mg/dL (ref <150)
VLDL: 12 mg/dL (ref 0–40)

## 2013-12-30 LAB — TSH: TSH: 0.538 u[IU]/mL (ref 0.350–4.500)

## 2013-12-30 LAB — POCT GLYCOSYLATED HEMOGLOBIN (HGB A1C): HEMOGLOBIN A1C: 5.3

## 2013-12-30 NOTE — Assessment & Plan Note (Signed)
History and symptoms suggest possible IBS, though recent CT in the ED was unremarkable. On chart review, pt has had heme-positive stool, however. This may require further work-up. Main focus of today's visit was to review history and to start improving BP control. At f/u, will plan to discuss abd pain / bowel habits further and consider referral to GI.

## 2013-12-30 NOTE — Assessment & Plan Note (Signed)
Headaches reported not suggestive of classic migraine and no obvious history to suggest medication overuse. Very likely related to poorly-controlled HTN. Plan to improve BP control, then to re-evaluate if headaches persist. Otherwise, advised Tylenol, for now.

## 2013-12-30 NOTE — Assessment & Plan Note (Signed)
Lipid panel impressively unimpressive in a poorly-controlled hypertensive pt who smokes; HDL 70, LDL 61, TG's 62. Monitor as needed.

## 2013-12-30 NOTE — Patient Instructions (Addendum)
Front desk: Please schedule Mr. Cardell for a blood pressure check in 1 week, then follow up with me in 2 weeks. --CMS  Thank you for coming in, today!  It was very nice to meet you. We talked about several things.  For your blood pressure: Keep taking your medications as prescribed by the ED with the following changes: Take TWO lisinopril 20 mg tablets (for a total of 40 mg) per day Take the clonidine THREE times per day (every 8 hours).  I will check several labs today. We'll talk about the results when you come back to see me, next time. If there is something that needs to be addressed immediately, I'll call you.  Come back to clinic in 1 week for a nurse blood pressure check. Come back to see me in 2 weeks after that. We will also refer you to a cardiologist to establish care. They can help us manage your blood pressure and it will be a good idea for them to be familiar with you.  Please feel free to call with any questions or concerns at any time, at [phone]. --Dr. Casper HarrisonStreet

## 2013-12-30 NOTE — Assessment & Plan Note (Signed)
Several psychosocial stressors currently contributing to depressed mood, though pt does not feel frankly "depressed," and declines further investigation / work-up of symptoms and states he generally "feels okay." Denies SI / HI. Will f/u as needed. Would have a low threshold to refer for counseling first rather than start pharmacotherapy, given pt perception of symptoms and definite psychosocial stressors.

## 2013-12-30 NOTE — Progress Notes (Signed)
   Subjective:    Patient ID: Stephen West, male    DOB: 10-08-1976, 38 y.o.   MRN: [id number]  HPI: Pt presents to clinic to establish care. He wishes to discuss several issues.  HTN - Pt was diagnosed with this around age 38-16; last saw a doctor about 2013 - has been on several different medications, including clonidine, HCTZ, lisinopril, and diltiazem (Rx by ED recently, see above) - has headaches occasionally (see below), but denies change in vision, definite chest pain, SOB, LE swelling  Chronic abdominal pain - present for about 3 months, worse for about 1-2 months - diarrhea and constipation alternating remain the same, with pain mild and intermittent - pain not present for about 1 week, but bowel movements still alternate between loose and hard - no definite triggers identified, has tried no specific medications, and has never seen GI  Migraine / headache - present for a couple of years but never formally diagnosed with migraine - no FH of migraine - mostly left sided, behind his left eye shooting back, usually starts around 6/10 up to 10/10 - tends to last a few hours, no aura, and generally resolves with rest and occasionally BP meds from "old prescriptions"  Depressed mood - recently lost job, has resulting financial stressors and some marital stress because of it - denies SI / HI - states he feels "okay" and is "dealing with things," and does not feel frankly depressed or like he needs help with his mood  FM - significant for several family members with HTN and DM SH - current smoker, 1 pack per 1-2 days, has tried quitting on his own; interested in quitting but not currently  In addition to the above documentation, pt's PMH, surgical history, FH, and SH all reviewed and updated where appropriate in the EMR. I have also reviewed and updated the pt's allergies and current medications as appropriate.  Review of Systems: As above. Also endorses occasional headaches, frequent  urination / thirst, cough, trouble swallowing, and tinnitus; several of these symptoms he feels are related to his high blood pressure. Otherwise, full 12-system ROS was reviewed and all negative.      Objective:   Physical Exam BP 178/120  Pulse 73  Temp(Src) 98.6 F (37 C) (Oral)  Wt 206 lb (93.441 kg) Manual recheck by me: 170/120 Gen: well-appearing adult male in NAD HEENT: [place]/AT, sclerae/conjunctivae clear, no lid lag, EOMI, PERRLA   MMM, posterior oropharynx clear, no cervical lymphadenopathy  neck supple with full ROM, no masses appreciated; thyroid not enlarged  Cardio: RRR, no murmur appreciated; distal pulses intact/symmetric Pulm: CTAB, no wheezes, normal WOB  Abd: soft, nondistended, BS+, no HSM; mild diffuse tenderness throughout lower quadrants Ext: warm/well-perfused, no cyanosis/clubbing/edema MSK: strength 5/5 in all four extremities, no frank joint deformity/effusion  normal ROM to all four extremities with no point muscle/bony tenderness in spine Neuro/Psych: alert/oriented, sensation grossly intact; normal gait/balance  mood euthymic with congruent affect     Assessment & Plan:  See problem list notes.  MDM Justification for New Patient Level IV visit ([id number]):  Problem points: 4 (existing problems, worsening x2 -- HTN, ?IBS / chronic abd pain)  Risk: moderate (Rx management)

## 2013-12-30 NOTE — Assessment & Plan Note (Signed)
Mildly elevated glucose on BMP in the ED, recently, and several symptoms concerning for possible glucose intolerance as well as FH of diabetes. A1c today however is only 5.3. Will monitor glucose with BMP's as needed when checking kidney function and otherwise continue to counsel on diet / exercise improvements, etc.

## 2013-12-30 NOTE — Assessment & Plan Note (Signed)
Current smoker, interested in quitting, but "not right now" due to several psychosocial stressors for which "smoking helps." Plan to continue counseling and encouraging to quit, especially given concomitant HTN without obvious complications, yet. Will address at each f/u and suggest 1-800-QUIT-NOW, health coaching, etc.

## 2013-12-30 NOTE — Assessment & Plan Note (Signed)
Precepted with Dr. Armen PickupFunches and referred to cardiology. A: Of major / primary concern for today's visit. Poorly controlled despite clonidine BID, lisinopril, HCTZ, and diltiazem XR. Many symptoms likely related to this, including occasional chest pain, headache, etc. Has not been seen by a doctor for this regularly in several years, and has never seen cardiology. Pt does state he thinks he's been on amlodipine in the past, without much help. Uncertain why his BP seems so hard to control, but has been long-standing. Serum Cr unimpressive and pt has no other obvious complications, as of yet.  P: Continue HCTZ, diltiazem. Increase lisinopril to 40 mg daily and increase clonidine 0.2 mg to TID. Referred to cardiology for further evaluation / potential assistance with management of HTN, and to establish relationship in the anticipation of development of cardiovascular problems in the future. F/u in 1 week for BP check, then in another week after that with me for potential medication adjustment.

## 2013-12-30 NOTE — Assessment & Plan Note (Signed)
Uncertain cause, though potentially related to poorly-controlled HTN; some concern for diabetes given family history and mildly elevated glucose in the ED, but A1c is only 5.3. Recent urine sample in the ED also without proteinuria. Plan to f/u as needed.

## 2013-12-31 ENCOUNTER — Other Ambulatory Visit: Payer: Self-pay | Admitting: Family Medicine

## 2013-12-31 ENCOUNTER — Encounter: Payer: Self-pay | Admitting: Family Medicine

## 2013-12-31 MED ORDER — LISINOPRIL 20 MG PO TABS: 40.0000 mg | ORAL_TABLET | Freq: Every day | ORAL | Status: DC

## 2013-12-31 MED ORDER — CLONIDINE HCL 0.2 MG PO TABS: 0.2000 mg | ORAL_TABLET | Freq: Three times a day (TID) | ORAL | Status: DC

## 2014-01-06 ENCOUNTER — Encounter: Payer: Self-pay | Admitting: Cardiovascular Disease

## 2014-01-06 ENCOUNTER — Ambulatory Visit (INDEPENDENT_AMBULATORY_CARE_PROVIDER_SITE_OTHER): Payer: BC Managed Care – PPO | Admitting: Cardiovascular Disease

## 2014-01-06 ENCOUNTER — Ambulatory Visit (INDEPENDENT_AMBULATORY_CARE_PROVIDER_SITE_OTHER): Payer: BC Managed Care – PPO | Admitting: *Deleted

## 2014-01-06 VITALS — BP 120/104 | HR 69 | Ht 72.0 in | Wt 198.1 lb

## 2014-01-06 VITALS — BP 150/100

## 2014-01-06 DIAGNOSIS — R7309 Other abnormal glucose: Secondary | ICD-10-CM

## 2014-01-06 DIAGNOSIS — J45909 Unspecified asthma, uncomplicated: Secondary | ICD-10-CM

## 2014-01-06 DIAGNOSIS — I10 Essential (primary) hypertension: Secondary | ICD-10-CM

## 2014-01-06 DIAGNOSIS — Z0130 Encounter for examination of blood pressure without abnormal findings: Secondary | ICD-10-CM

## 2014-01-06 DIAGNOSIS — R739 Hyperglycemia, unspecified: Secondary | ICD-10-CM

## 2014-01-06 DIAGNOSIS — Z136 Encounter for screening for cardiovascular disorders: Secondary | ICD-10-CM

## 2014-01-06 MED ORDER — POTASSIUM CHLORIDE CRYS ER 10 MEQ PO TBCR: 10.0000 meq | EXTENDED_RELEASE_TABLET | Freq: Every day | ORAL | Status: DC

## 2014-01-06 MED ORDER — ALBUTEROL SULFATE HFA 108 (90 BASE) MCG/ACT IN AERS: 2.0000 | INHALATION_SPRAY | Freq: Four times a day (QID) | RESPIRATORY_TRACT | Status: DC | PRN

## 2014-01-06 NOTE — Progress Notes (Signed)
     Stephen West Date of Birth  1976-03-24       [facility]    [place] [facility] [address], [address]  [address] [place], [place]  [address]   [place]  [id number] [phone]    [phone]   Fax  [phone]     Fax [phone]  Problem List: 1. Hypertension 2. Migraine headaches 3. Depression  History of Present Illness:  Stephen West is a 38 yo with a long history of hypertension. He recalls having high blood pressure since age 38.  He lost his insurance and cannot afford the medications until about a month ago. He now has been back on his blood pressure medications for about one month.  Many family members have high blood pressure  He still smokes 1 ppd. 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 He does not add salt to his meals.  He tries to eat at home.  He does eat some chips.   Current Outpatient Prescriptions on File Prior to Visit  Medication Sig Dispense Refill  . cloNIDine (CATAPRES) 0.2 MG tablet Take 1 tablet (0.2 mg total) by mouth 3 (three) times daily.  60 tablet  2  . diltiazem (DILACOR XR) 240 MG 24 hr capsule Take 1 capsule (240 mg total) by mouth daily.  30 capsule  2  . hydrochlorothiazide (HYDRODIURIL) 25 MG tablet Take 1 tablet (25 mg total) by mouth daily.  30 tablet  2  . lisinopril (PRINIVIL,ZESTRIL) 20 MG tablet Take 2 tablets (40 mg total) by mouth daily.  30 tablet  2   No current facility-administered medications on file prior to visit.    No Known Allergies  Past Medical History  Diagnosis Date  . Hypertension     No past surgical history on file.  History  Smoking status  . Current Every Day Smoker  Smokeless tobacco  . Not on file    History  Alcohol Use  . Yes    No family history on file.  Reviw of Systems:  Reviewed in the HPI.  All other systems are negative.  Physical Exam: Blood pressure 120/104, pulse 69, height 6' (1.829 m), weight 198 lb 1.9 oz (89.867 kg). Wt  Readings from Last 3 Encounters:  01/06/14 198 lb 1.9 oz (89.867 kg)  12/30/13 206 lb (93.441 kg)  12/06/13 215 lb (97.523 kg)     General: Well developed, well nourished, in no acute distress.  Head: Normocephalic, atraumatic, sclera non-icteric, mucus membranes are moist,   Neck: Supple. Carotids are 2 + without bruits. No JVD   Lungs: bilateral tight wheezing.    Heart: RR, normal S1,S2  Abdomen: Soft, non-tender, non-distended with normal bowel sounds.  Msk:  Strength and tone are normal   Extremities: No clubbing or cyanosis. No edema.  Distal pedal pulses are 2+ and equal    Neuro: CN II - XII intact.  Alert and oriented X 3.   Psych:  Normal   ECG: January 06, 2014: Normal sinus rhythm at 69. He has sinus arrhythmias. His voltage for left ventricular hypertrophy with repolarization abnormalities in the lateral leads.  Assessment / Plan:

## 2014-01-06 NOTE — Progress Notes (Signed)
   Pt in clinic for blood pressure check.  Blood pressure ready today 150/100 manually.  Pt informed to contact office if symptoms of SOB, chest pain, dizziness, n/v, facial changes, slurred speech or numbness/ tingling of the extremities.  Pt stated he smoked two cigarettes and drunk a cup of coffee before office visit; complained of head cold.  Pt also took medication as prescribed.  Will forward note to provider.  Clovis PuMartin, Tamika L, RN

## 2014-01-06 NOTE — Assessment & Plan Note (Signed)
His diastolic blood pressure remains elevated. His systolic blood pressure is fairly well controlled. It is clear that he still eats a bit of extra salt but he does not meet. We will have him work on his diet. I strongly recommended that he stop smoking. We will does help his hypertension but will also help with his bronchospasm. He clearly has tight wheezing today. He's never been diagnosed with asthma.  We will add potassium chloride 10 mg a day to his medical regimen. I'll see him again in 6 weeks for followup visit.

## 2014-01-06 NOTE — Patient Instructions (Addendum)
REDUCE HIGH SODIUM FOODS LIKE CANNED SOUP, GRAVY, SAUCES, READY PREPARED FOODS LIKE FROZEN FOODS; LEAN CUISINE, LASAGNA. BACON, SAUSAGE, LUNCH MEAT, FAST FOODS, HOT DOGS, CHIPS, PIZZA, CHINESE FOOD, SOY SAUCE, STORE BOUGHT FRIED CHICKEN= [place] FRIED CHICKEN/ BOJANGLES. Reduce  KETCHUP, MUSTARD OLIVES, PICKLES,    1. Use the inhaler 2 puffs every 6 hours until your cough and shortness breath resolves.  2. By salt substitute-potassium chloride -  at a store. Use this instead of your regular salt.  3. You need to eliminate the salt out of every aspect to diet. He must limit hotdogs, ham, bacon, sausage.  You should eat frozen vegetables instead of canned  Vegetables  Your physician has recommended you make the following change in your medication:  Start potassium chloride 10 meq daily Start albuterol inhaler  Your physician recommends that you schedule a follow-up appointment in:6 weeks   Your physician recommends that you return for lab work in: bmet 6 weeks

## 2014-01-06 NOTE — Assessment & Plan Note (Signed)
He is very tight wheezing on exam. Also smokes. I strongly recommended that he stop smoking. We'll give him a prescription for albuterol metered-dose inhaler. He should use this 2 puffs every 6 hours for the next 5 days or so. He can then use it as needed.

## 2014-01-12 ENCOUNTER — Ambulatory Visit (INDEPENDENT_AMBULATORY_CARE_PROVIDER_SITE_OTHER): Payer: BC Managed Care – PPO | Admitting: Family Medicine

## 2014-01-12 ENCOUNTER — Encounter: Payer: Self-pay | Admitting: Family Medicine

## 2014-01-12 VITALS — BP 160/107 | HR 81 | Ht 72.0 in | Wt 203.0 lb

## 2014-01-12 DIAGNOSIS — J3489 Other specified disorders of nose and nasal sinuses: Secondary | ICD-10-CM

## 2014-01-12 DIAGNOSIS — I10 Essential (primary) hypertension: Secondary | ICD-10-CM

## 2014-01-12 DIAGNOSIS — R0981 Nasal congestion: Secondary | ICD-10-CM | POA: Insufficient documentation

## 2014-01-12 DIAGNOSIS — F17200 Nicotine dependence, unspecified, uncomplicated: Secondary | ICD-10-CM

## 2014-01-12 DIAGNOSIS — M25559 Pain in unspecified hip: Secondary | ICD-10-CM | POA: Insufficient documentation

## 2014-01-12 DIAGNOSIS — Z7189 Other specified counseling: Secondary | ICD-10-CM

## 2014-01-12 DIAGNOSIS — Z716 Tobacco abuse counseling: Secondary | ICD-10-CM

## 2014-01-12 NOTE — Progress Notes (Signed)
   Subjective:    Patient ID: Stephen West, male    DOB: 1975/11/28, 38 y.o.   MRN: [id number]  HPI: Pt presents to clinic for f/u on recent labs and to further discuss HTN. Pt also wishes to discuss chronic hip pain and acute sinus issues.  Hip pain: described as a sharp pain and an aching that comes from holding any given position for a period of time - left-sided in nature, surrounds the hip, worse in the side and back - denies radiation down into leg though does occasionally have some shock / electricity sensation in low-back but not in the top of the thigh - states he has fallen due to the pain before, but it has been a long time - when it occurs, sitting and resting helps - has never taken medicine for this or been seen by a doctor about it  Congestion / sinus trouble: present for a few days - generally describes congestion, sinus pressure, headache - denies severe cough, SOB, productive cough or nasal drainage - denies fever / chills, N/V  HTN: still elevated, reports compliance with medications - Dr. Melburn PopperNasher added potassium and plans to f/u in about 6 weeks - denies chest pain, SOB, headaches, change in vision, or swelling in LE - was given albuterol  Tobacco use - starting to cut back - currently using about 1/2 or a few more per day - has not tried 1-800-QUIT-NOW, would prefer to try this rather than have health coaching, for now.  Pt is a current smoker, as above. In addition to the above documentation, pt's PMH, surgical history, FH, and SH all reviewed and updated where appropriate in the EMR. I have also reviewed and updated the pt's allergies and current medications as appropriate.  Review of Systems: As above. No other new complaints. Otherwise, full 12-system ROS was reviewed and all negative.     Objective:   Physical Exam BP 160/107  Pulse 81  Ht 6' (1.829 m)  Wt 203 lb (92.08 kg)  BMI 27.53 kg/m2 Gen: well-appearing adult male in NAD HEENT: /AT,  sclerae/conjunctivae clear, no lid lag, EOMI, PERRLA   MMM, posterior oropharynx clear, no cervical lymphadenopathy  neck supple with full ROM, no masses appreciated; thyroid not enlarged  Cardio: RRR, no murmur appreciated; distal pulses intact/symmetric Pulm: CTAB, no wheezes, normal WOB  Abd: soft, nondistended, BS+, no HSM Ext: warm/well-perfused, no cyanosis/clubbing/edema MSK: normal appearance to all four extremities, no frank joint deformity/effusion  Left hip with normal ROM, some increased pain more with internal than external rotation  No frank bony or definite / discrete area of soft tissue tenderness around left hip anteriorly / laterally  Sitting straight leg lift test negative for radiculopathy on the left, but does cause some muscle tightness / pain at hip  Posterior pelvis bony prominences with some tenderness on the left ?around SI joint Neuro/Psych: alert/oriented, sensation grossly intact; normal gait/balance  mood euthymic with congruent affect  Strength 5/5 in all four extremities; left hip pain does not seem to affect strength  Normal distal sensation to bilateral LE     Assessment & Plan:

## 2014-01-12 NOTE — Assessment & Plan Note (Signed)
Likely viral URI. Recommended supportive care with nasal sprays / saline, decongestant such as Mucinex, etc. Recommended avoiding NSAIDs and alpha agents such as Sudafed, given marked hypertension. F/u as needed.

## 2014-01-12 NOTE — Assessment & Plan Note (Signed)
A: Diastolic BP remains significantly elevated, and systolic BP elevated today to 161'W160's, though possibly with component of hip pain. Pt reports still smoking but cutting back and interested in stopping completely; see separate problem list note. Generally feels well, with some viral URI-type symptoms acutely.  P: Continue current medications without change; clonidine TID, lisinopril 20, HCTZ 25, diltiazem 240 mg XR, and K-Dur 10 mEq / day (added by Dr. Melburn PopperNasher). Instructed pt to f/u with Dr. Melburn PopperNasher in a few weeks as instructed; will defer any med changes at this time, though expect lisinopril may need to be increased (recent BMP with Cr slightly elevated but still within normal range) and may also consider addition of hydralazine or beta-blocker. Will monitor routinely at f/u.

## 2014-01-12 NOTE — Patient Instructions (Signed)
Thank you for coming in, today!  For your hip pain: I want you to get some xrays of your hip. [facility]. I will give you some exercises to try. You can take Tylenol for pain. Stay away from Motrin, Advil, Aleve, etc, for now (because of your blood pressure).  For your blood pressure: Keep taking your medicines as prescribed. I don't want to make any changes, just yet. Follow up with Dr. Melburn PopperNasher in a few weeks. He or I after that may want to change or add a medication.  Otherwise, your blood work looked good last time. You don't need any medication for cholesterol or blood sugar. Depending on what your xray shows, I might end up referring you to the sports medicine clinic. Their number is [phone].  Plan to come back to see me in about 2 months. Please feel free to call with any questions or concerns at any time, at [phone]. --Dr. Casper HarrisonStreet

## 2014-01-12 NOTE — Assessment & Plan Note (Signed)
Current smoker, cutting back on his own, but declines formal assistance with quitting (offered health coaching, etc). Will continue counseling at regular f/u and praised pt for cutting back on his own. Provided pt with 1-800-QUIT-NOW information and will reiterate possibility of utilizing their services in the future. Otherwise appreciate counseling from Dr. Melburn PopperNasher as well, and will f/u as needed.

## 2014-01-12 NOTE — Assessment & Plan Note (Signed)
A: Uncertain exactly what mechanism explains hip pain, though possibly hip flexor strain, piriformis pain, or potentially frank arthritis / joint pathology. Generally with unremarkable exam, otherwise, though, with normal ROM and no frank deformity or other signs / symptoms to suggest joint infection or fracture.  P: Ordered for complete left hip xray and will f/u results as appropriately. Otherwise suggested supportive care, Tylenol for pain, etc, and provided pt with piriformis syndrome exercises. Also provided pt with sports medicine clinic number, in case supportive care does not help in the next few weeks or if he would prefer being seen there. Otherwise, will f/u as needed based on xray results.

## 2014-01-27 ENCOUNTER — Ambulatory Visit (HOSPITAL_COMMUNITY)
Admission: RE | Admit: 2014-01-27 | Discharge: 2014-01-27 | Disposition: A | Discharge status: Home / Self Care | Payer: BC Managed Care – PPO | Source: Ambulatory Visit | Attending: Family Medicine | Admitting: Family Medicine

## 2014-01-27 DIAGNOSIS — M25559 Pain in unspecified hip: Secondary | ICD-10-CM | POA: Insufficient documentation

## 2014-01-28 ENCOUNTER — Encounter: Payer: Self-pay | Admitting: Family Medicine

## 2014-02-01 ENCOUNTER — Ambulatory Visit: Payer: BC Managed Care – PPO | Admitting: Cardiology

## 2014-02-16 ENCOUNTER — Ambulatory Visit: Payer: BC Managed Care – PPO | Admitting: Cardiovascular Disease

## 2014-03-04 ENCOUNTER — Encounter: Payer: Self-pay | Admitting: Cardiovascular Disease

## 2014-12-07 ENCOUNTER — Ambulatory Visit: Payer: Self-pay | Admitting: Family Medicine

## 2014-12-12 ENCOUNTER — Encounter: Payer: Self-pay | Admitting: Family Medicine

## 2014-12-12 ENCOUNTER — Ambulatory Visit (INDEPENDENT_AMBULATORY_CARE_PROVIDER_SITE_OTHER): Payer: 59 | Admitting: Family Medicine

## 2014-12-12 VITALS — BP 203/135 | HR 89 | Temp 98.4°F | Ht 72.0 in | Wt 217.0 lb

## 2014-12-12 DIAGNOSIS — I10 Essential (primary) hypertension: Secondary | ICD-10-CM

## 2014-12-12 DIAGNOSIS — G44039 Episodic paroxysmal hemicrania, not intractable: Secondary | ICD-10-CM

## 2014-12-12 DIAGNOSIS — Z716 Tobacco abuse counseling: Secondary | ICD-10-CM

## 2014-12-12 MED ORDER — CLONIDINE HCL 0.1 MG PO TABS: 0.2000 mg | ORAL_TABLET | Freq: Three times a day (TID) | ORAL | Status: DC

## 2014-12-12 MED ORDER — DILTIAZEM HCL ER 240 MG PO CP24: 240.0000 mg | ORAL_CAPSULE | Freq: Every day | ORAL | Status: DC

## 2014-12-12 MED ORDER — CLONIDINE HCL 0.1 MG PO TABS: 0.1000 mg | ORAL_TABLET | Freq: Three times a day (TID) | ORAL | Status: DC

## 2014-12-12 NOTE — Assessment & Plan Note (Signed)
Overall symptoms suggest migraine and / or headache caused or worsened by uncontrolled HTN. No definite prior hx of migraines and does not appear to be medication overuse. Plan now to improve BP control, then re-evaluate. Continue OTC medications, for now. F/u PRN.

## 2014-12-12 NOTE — Assessment & Plan Note (Addendum)
A: Very elevated today, especially diastolic, similar to past readings. Not on any medication for quite some time and not seen in clinic for almost 1 year due to being out of insurance. Now insured and wanting to restart medications. No signs / symptoms to suggest TIA or stroke or MI. Some headache and chronic congestion / rhinorrhea which may be due to or worsened by HTN.  P: Restart clonidine (down to 0.1 mg TID, previously on 0.2 mg TID) and diltiazem XR 240 mg daily. Recheck BMP for Cr prior to restarting lisinopril (previously on 40 mg daily) and HCTZ (previously on 25 mg daily); if Cr elevated, may need to consider different medications. Future lab order put in (pt in clinic after lab had closed); will likely restart lisinopril and HCTZ if Cr is stable, and plan to f/u in 2-3 weeks for recheck BP and repeat BMP. May also need to f/u again with Dr. Melburn PopperNasher once medication regimen is re-established. Counseled extensively on red flags that would suggest TIA / stroke or MI, and when to call the clinic or present to the ED. See also headache and tobacco cessation problem list notes.

## 2014-12-12 NOTE — Patient Instructions (Signed)
Front Desk: Please schedule Mr. Legate a lab appointment in 1-3 days, then a follow-up appointment with me in 2-3 weeks. Thanks! --Dr. Casper HarrisonStreet  Thank you for coming in, today! Your blood pressure is still very high. I want to restart two of your medicines - diltiazem and clonidine. I want to check your blood work before we restart the other two - lisinopril and HCTZ.  Try your best to stop smoking! Even cutting back some is helpful. Try calling 1-800-QUIT-NOW -- this is completely free and can help you come up with ways to help you stop smoking.  Make a lab appointment in the next few days to recheck your kidney function. I'll call or send you a letter with those results, and we'll probably restart some other blood pressure medications after that. Come back to see me in about 2-3 weeks.  Please feel free to call with any questions or concerns at any time, at [phone]. --Dr. Casper HarrisonStreet

## 2014-12-12 NOTE — Assessment & Plan Note (Signed)
Current smoker, declining formal cessation assistance, currently. Reiterated counseling to stop or at least cut back, citing increased risk of vascular disease / MI / etc with smoking. Reprovided pt with 1-800-QUIT-NOW number. Continue counseling at regular f/u visits, as well.

## 2014-12-12 NOTE — Progress Notes (Signed)
   Subjective:    Patient ID: Stephen West, male    DOB: [birth year], 39 y.o.   MRN: [id number]  HPI: Pt presents to clinic for f/u of HTN. He has not been on any medications for "some months" due to having lost his insurance. He now does have new insurance. - does not check BP at home or at his pharmacy - denies chest pain, SOB, cough, LE swelling, change in vision - denies paresthesias, numbness / weakness anywhere - reports chronic / intermittent runny nose and nasal congestion, which he thinks has been better in the past with better blood pressure control - denies fever / chills, productive cough, or other frank coryza-type symptoms - states he occasionally has headaches, described as "real hard," mostly on the left side, mostly throbbing similar to a toothache - reports he takes acetaminophen, Motrin, ASA, etc, for headaches; headaches usually resolves with these and sleep - denies scotomata or other aura but does report phonophobia and occasional photophobia - does report a previous history of headaches but no formal diagnosis of migraines  Of note, pt has not been seen in about 1 year and has not been on medications in several months, as above, due to loss of insurance. Pt is a current every-day smoker. He declined formal cessation counseling last year and has not ever called the 1-800-QUIT-NOW number.  Review of Systems: As above.     Objective:   Physical Exam BP 203/135 mmHg  Pulse 89  Temp(Src) 98.4 F (36.9 C) (Oral)  Ht 6' (1.829 m)  Wt 217 lb (98.431 kg)  BMI 29.42 kg/m2 Manual recheck BP: 178/130 Gen: well-appearing adult male in NAD HEENT: [place]/AT, EOMI, PERRLA, TM's clear bilaterally  Nasal mucosae mildly edematous, posterior oropharynx mildly red but no frank tonsillar swelling / exudates Cardio: RRR, no murmur appreciated Pulm: CTAB, no wheezes, normal WOB Ext: warm, well-perfused, no LE edema Neuro: alert, oriented, no gross focal deficit, grossly normal  sensation  Strength 5/5 throughout all extremities  Gait / station normal, speech normal     Assessment & Plan:  See problem list note.  Bobbye Mortonhristopher M Kyanna Mahrt, MD PGY-3, [facility] 12/12/2014, 5:14 PM

## 2014-12-14 ENCOUNTER — Other Ambulatory Visit: Payer: 59

## 2014-12-14 DIAGNOSIS — I10 Essential (primary) hypertension: Secondary | ICD-10-CM

## 2014-12-14 LAB — BASIC METABOLIC PANEL
BUN: 7 mg/dL (ref 6–23)
CALCIUM: 9 mg/dL (ref 8.4–10.5)
CHLORIDE: 103 meq/L (ref 96–112)
CO2: 25 meq/L (ref 19–32)
Creat: 1.16 mg/dL (ref 0.50–1.35)
Glucose, Bld: 88 mg/dL (ref 70–99)
POTASSIUM: 4.1 meq/L (ref 3.5–5.3)
SODIUM: 138 meq/L (ref 135–145)

## 2014-12-14 NOTE — Progress Notes (Signed)
[facility] phlebotomist drew:  BMP 

## 2014-12-15 ENCOUNTER — Telehealth: Payer: Self-pay | Admitting: Family Medicine

## 2014-12-15 MED ORDER — LISINOPRIL-HYDROCHLOROTHIAZIDE 20-12.5 MG PO TABS: 1.0000 | ORAL_TABLET | Freq: Every day | ORAL | Status: DC

## 2014-12-15 NOTE — Telephone Encounter (Signed)
Called pt to discuss HTN and recent labs. Overall kidney function remains about the same and other electrolytes look fine. Pt reports fewer headaches since restarting clonidine 0.1 mg TID and diltiazem XR 240 mg daily. Will restart lisinopril-HCTZ at lower doses (20-12.5, down from 40-25), then follow up in about 2 weeks to recheck BP and BMP, then uptitrate as needed -- previously was on clonidine 0.2 TID and higher lisinopril-HCTZ as above. Pt voiced understanding. --CMS

## 2014-12-29 ENCOUNTER — Ambulatory Visit: Payer: 59 | Admitting: Family Medicine

## 2015-01-03 ENCOUNTER — Ambulatory Visit: Payer: 59 | Admitting: Family Medicine

## 2015-01-08 ENCOUNTER — Other Ambulatory Visit: Payer: Self-pay | Admitting: Family Medicine

## 2015-01-09 ENCOUNTER — Other Ambulatory Visit: Payer: Self-pay | Admitting: *Deleted

## 2015-01-09 MED ORDER — DILTIAZEM HCL ER 240 MG PO CP24: 240.0000 mg | ORAL_CAPSULE | Freq: Every day | ORAL | Status: DC

## 2015-05-26 ENCOUNTER — Emergency Department (HOSPITAL_COMMUNITY)
Admission: EM | Admit: 2015-05-26 | Discharge: 2015-05-26 | Disposition: A | Discharge status: Home / Self Care | Payer: No Typology Code available for payment source | Attending: Emergency Medicine | Admitting: Emergency Medicine

## 2015-05-26 ENCOUNTER — Encounter (HOSPITAL_COMMUNITY): Payer: Self-pay | Admitting: Emergency Medicine

## 2015-05-26 DIAGNOSIS — S0990XA Unspecified injury of head, initial encounter: Secondary | ICD-10-CM | POA: Insufficient documentation

## 2015-05-26 DIAGNOSIS — Y998 Other external cause status: Secondary | ICD-10-CM | POA: Insufficient documentation

## 2015-05-26 DIAGNOSIS — I10 Essential (primary) hypertension: Secondary | ICD-10-CM | POA: Diagnosis not present

## 2015-05-26 DIAGNOSIS — Y92410 Unspecified street and highway as the place of occurrence of the external cause: Secondary | ICD-10-CM | POA: Diagnosis not present

## 2015-05-26 DIAGNOSIS — S161XXA Strain of muscle, fascia and tendon at neck level, initial encounter: Secondary | ICD-10-CM | POA: Diagnosis not present

## 2015-05-26 DIAGNOSIS — Y9389 Activity, other specified: Secondary | ICD-10-CM | POA: Insufficient documentation

## 2015-05-26 DIAGNOSIS — Z79899 Other long term (current) drug therapy: Secondary | ICD-10-CM | POA: Insufficient documentation

## 2015-05-26 DIAGNOSIS — S199XXA Unspecified injury of neck, initial encounter: Secondary | ICD-10-CM | POA: Diagnosis present

## 2015-05-26 DIAGNOSIS — Z720 Tobacco use: Secondary | ICD-10-CM | POA: Insufficient documentation

## 2015-05-26 MED ORDER — IBUPROFEN 800 MG PO TABS: 800.0000 mg | ORAL_TABLET | Freq: Three times a day (TID) | ORAL | Status: DC
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
## 2015-05-26 NOTE — Discharge Instructions (Signed)
Cervical Sprain °A cervical sprain is an injury in the neck in which the strong, fibrous tissues (ligaments) that connect your neck bones stretch or tear. Cervical sprains can range from mild to severe. Severe cervical sprains can cause the neck vertebrae to be unstable. This can lead to damage of the spinal cord and can result in serious nervous system problems. The amount of time it takes for a cervical sprain to get better depends on the cause and extent of the injury. Most cervical sprains heal in 1 to 3 weeks. °CAUSES  °Severe cervical sprains may be caused by:  °· Contact sport injuries (such as from football, rugby, wrestling, hockey, auto racing, gymnastics, diving, martial arts, or boxing).   °· Motor vehicle collisions.   °· Whiplash injuries. This is an injury from a sudden forward and backward whipping movement of the head and neck.  °· Falls.   °Mild cervical sprains may be caused by:  °· Being in an awkward position, such as while cradling a telephone between your ear and shoulder.   °· Sitting in a chair that does not offer proper support.   °· Working at a poorly designed computer station.   °· Looking up or down for long periods of time.   °SYMPTOMS  °· Pain, soreness, stiffness, or a burning sensation in the front, back, or sides of the neck. This discomfort may develop immediately after the injury or slowly, 24 hours or more after the injury.   °· Pain or tenderness directly in the middle of the back of the neck.   °· Shoulder or upper back pain.   °· Limited ability to move the neck.   °· Headache.   °· Dizziness.   °· Weakness, numbness, or tingling in the hands or arms.   °· Muscle spasms.   °· Difficulty swallowing or chewing.   °· Tenderness and swelling of the neck.   °DIAGNOSIS  °Most of the time your health care provider can diagnose a cervical sprain by taking your history and doing a physical exam. Your health care provider will ask about previous neck injuries and any known neck  problems, such as arthritis in the neck. X-rays may be taken to find out if there are any other problems, such as with the bones of the neck. Other tests, such as a CT scan or MRI, may also be needed.  °TREATMENT  °Treatment depends on the severity of the cervical sprain. Mild sprains can be treated with rest, keeping the neck in place (immobilization), and pain medicines. Severe cervical sprains are immediately immobilized. Further treatment is done to help with pain, muscle spasms, and other symptoms and may include: °· Medicines, such as pain relievers, numbing medicines, or muscle relaxants.   °· Physical therapy. This may involve stretching exercises, strengthening exercises, and posture training. Exercises and improved posture can help stabilize the neck, strengthen muscles, and help stop symptoms from returning.   °HOME CARE INSTRUCTIONS  °· Put ice on the injured area.   °¨ Put ice in a plastic bag.   °¨ Place a towel between your skin and the bag.   °¨ Leave the ice on for 15-20 minutes, 3-4 times a day.   °· If your injury was severe, you may have been given a cervical collar to wear. A cervical collar is a two-piece collar designed to keep your neck from moving while it heals. °¨ Do not remove the collar unless instructed by your health care provider. °¨ If you have long hair, keep it outside of the collar. °¨ Ask your health care provider before making any adjustments to your collar. Minor   adjustments may be required over time to improve comfort and reduce pressure on your chin or on the back of your head. °¨ If you are allowed to remove the collar for cleaning or bathing, follow your health care provider's instructions on how to do so safely. °¨ Keep your collar clean by wiping it with mild soap and water and drying it completely. If the collar you have been given includes removable pads, remove them every 1-2 days and hand wash them with soap and water. Allow them to air dry. They should be completely  dry before you wear them in the collar. °¨ If you are allowed to remove the collar for cleaning and bathing, wash and dry the skin of your neck. Check your skin for irritation or sores. If you see any, tell your health care provider. °¨ Do not drive while wearing the collar.   °· Only take over-the-counter or prescription medicines for pain, discomfort, or fever as directed by your health care provider.   °· Keep all follow-up appointments as directed by your health care provider.   °· Keep all physical therapy appointments as directed by your health care provider.   °· Make any needed adjustments to your workstation to promote good posture.   °· Avoid positions and activities that make your symptoms worse.   °· Warm up and stretch before being active to help prevent problems.   °SEEK MEDICAL CARE IF:  °· Your pain is not controlled with medicine.   °· You are unable to decrease your pain medicine over time as planned.   °· Your activity level is not improving as expected.   °SEEK IMMEDIATE MEDICAL CARE IF:  °· You develop any bleeding. °· You develop stomach upset. °· You have signs of an allergic reaction to your medicine.   °· Your symptoms get worse.   °· You develop new, unexplained symptoms.   °· You have numbness, tingling, weakness, or paralysis in any part of your body.   °MAKE SURE YOU:  °· Understand these instructions. °· Will watch your condition. °· Will get help right away if you are not doing well or get worse. °Document Released: 08/04/2007 Document Revised: 10/12/2013 Document Reviewed: 04/14/2013 °[employer]® Patient Information ©2015 [employer]. This information is not intended to replace advice given to you by your health care provider. Make sure you discuss any questions you have with your health care provider. ° °Motor Vehicle Collision °It is common to have multiple bruises and sore muscles after a motor vehicle collision (MVC). These tend to feel worse for the first 24 hours. You may have  the most stiffness and soreness over the first several hours. You may also feel worse when you wake up the first morning after your collision. After this point, you will usually begin to improve with each day. The speed of improvement often depends on the severity of the collision, the number of injuries, and the location and nature of these injuries. °HOME CARE INSTRUCTIONS °· Put ice on the injured area. °¨ Put ice in a plastic bag. °¨ Place a towel between your skin and the bag. °¨ Leave the ice on for 15-20 minutes, 3-4 times a day, or as directed by your health care provider. °· Drink enough fluids to keep your urine clear or pale yellow. Do not drink alcohol. °· Take a warm shower or bath once or twice a day. This will increase blood flow to sore muscles. °· You may return to activities as directed by your caregiver. Be careful when lifting, as this may aggravate neck or back   pain. °· Only take over-the-counter or prescription medicines for pain, discomfort, or fever as directed by your caregiver. Do not use aspirin. This may increase bruising and bleeding. °SEEK IMMEDIATE MEDICAL CARE IF: °· You have numbness, tingling, or weakness in the arms or legs. °· You develop severe headaches not relieved with medicine. °· You have severe neck pain, especially tenderness in the middle of the back of your neck. °· You have changes in bowel or bladder control. °· There is increasing pain in any area of the body. °· You have shortness of breath, light-headedness, dizziness, or fainting. °· You have chest pain. °· You feel sick to your stomach (nauseous), throw up (vomit), or sweat. °· You have increasing abdominal discomfort. °· There is blood in your urine, stool, or vomit. °· You have pain in your shoulder (shoulder strap areas). °· You feel your symptoms are getting worse. °MAKE SURE YOU: °· Understand these instructions. °· Will watch your condition. °· Will get help right away if you are not doing well or get  worse. °Document Released: 10/07/2005 Document Revised: 02/21/2014 Document Reviewed: 03/06/2011 °Exit[employer]® Patient Information ©2015 [employer]. This information is not intended to replace advice given to you by your health care provider. Make sure you discuss any questions you have with your health care provider. ° °

## 2015-05-26 NOTE — ED Notes (Signed)
Pt ambulatory with steady gait to void in BR.  
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
## 2015-10-08 ENCOUNTER — Emergency Department (HOSPITAL_COMMUNITY)
Admission: EM | Admit: 2015-10-08 | Discharge: 2015-10-08 | Disposition: A | Discharge status: Home / Self Care | Payer: 59 | Attending: Emergency Medicine | Admitting: Emergency Medicine

## 2015-10-08 ENCOUNTER — Encounter (HOSPITAL_COMMUNITY): Payer: Self-pay | Admitting: Nurse Practitioner

## 2015-10-08 ENCOUNTER — Emergency Department (HOSPITAL_COMMUNITY): Payer: 59

## 2015-10-08 DIAGNOSIS — B360 Pityriasis versicolor: Secondary | ICD-10-CM

## 2015-10-08 DIAGNOSIS — H748X3 Other specified disorders of middle ear and mastoid, bilateral: Secondary | ICD-10-CM | POA: Insufficient documentation

## 2015-10-08 DIAGNOSIS — Z791 Long term (current) use of non-steroidal anti-inflammatories (NSAID): Secondary | ICD-10-CM | POA: Insufficient documentation

## 2015-10-08 DIAGNOSIS — Z79899 Other long term (current) drug therapy: Secondary | ICD-10-CM | POA: Insufficient documentation

## 2015-10-08 DIAGNOSIS — I10 Essential (primary) hypertension: Secondary | ICD-10-CM | POA: Insufficient documentation

## 2015-10-08 DIAGNOSIS — F17210 Nicotine dependence, cigarettes, uncomplicated: Secondary | ICD-10-CM | POA: Insufficient documentation

## 2015-10-08 DIAGNOSIS — J069 Acute upper respiratory infection, unspecified: Secondary | ICD-10-CM

## 2015-10-08 MED ORDER — CLONIDINE HCL 0.1 MG PO TABS: 0.1000 mg | ORAL_TABLET | Freq: Three times a day (TID) | ORAL | Status: DC

## 2015-10-08 MED ORDER — AZITHROMYCIN 250 MG PO TABS
250.0000 mg | ORAL_TABLET | Freq: Every day | ORAL | Status: DC
Start: 2015-10-08 — End: 2015-12-04

## 2015-10-08 MED ORDER — CLONIDINE HCL 0.1 MG PO TABS
0.1000 mg | ORAL_TABLET | Freq: Once | ORAL | Status: AC
  Administered 2015-10-08: 0.1 mg via ORAL
  Filled 2015-10-08: qty 1

## 2015-10-08 MED ORDER — SELENIUM SULFIDE 2.25 % EX SHAM: 10.0000 mL | MEDICATED_SHAMPOO | Freq: Every day | CUTANEOUS | Status: DC

## 2015-10-08 MED ORDER — FLUCONAZOLE 150 MG PO TABS
300.0000 mg | ORAL_TABLET | Freq: Once | ORAL | Status: AC
  Administered 2015-10-08: 300 mg via ORAL
  Filled 2015-10-08: qty 2

## 2015-10-08 MED ORDER — FLUCONAZOLE 150 MG PO TABS: 300.0000 mg | ORAL_TABLET | Freq: Every day | ORAL | Status: DC

## 2015-10-08 MED ORDER — LISINOPRIL-HYDROCHLOROTHIAZIDE 20-12.5 MG PO TABS: 1.0000 | ORAL_TABLET | Freq: Every day | ORAL | Status: DC

## 2015-10-08 MED ORDER — DILTIAZEM HCL ER 240 MG PO CP24: 240.0000 mg | ORAL_CAPSULE | Freq: Every day | ORAL | Status: DC

## 2015-10-08 NOTE — ED Notes (Signed)
md at bedside

## 2015-10-08 NOTE — ED Notes (Signed)
Pt alert and oriented x4. Respirations even and unlabored, bilateral symmetrical rise and fall of chest. Skin warm and dry. In no acute distress. Denies needs.   

## 2015-10-08 NOTE — ED Provider Notes (Signed)
CSN: [id number]     Arrival date & time 10/08/15  1639 History   First MD Initiated Contact with Patient 10/08/15 1646     Chief Complaint  Patient presents with  . Hypertension  . Nasal Congestion     (Consider location/radiation/quality/duration/timing/severity/associated sxs/prior Treatment) HPI Comments: Patient is also noted to be hypertensive here today. He is supposed to be taking clonidine, diltiazem and lisinopril HCTZ but has not been on any of these medications between 3 and 6 months. He states he ran out of the prescriptions and he just did not follow back up with his doctor to get them refilled. He denies any visual changes, unilateral weakness or numbness, severe headaches, chest pain or shortness of breath. Also he noted today he took some over-the-counter cold medicine and is unclear if it's safer people with hypertension  Patient is a 39 y.o. male presenting with URI. The history is provided by the patient.  URI Presenting symptoms: congestion, cough, facial pain and rhinorrhea   Presenting symptoms: no fever and no sore throat   Severity:  Moderate Onset quality:  Gradual Duration: 1.5. Timing:  Constant Progression:  Unchanged Chronicity:  New Relieved by:  Nothing Exacerbated by: night and lying down. Ineffective treatments:  OTC medications Associated symptoms: headaches and sinus pain   Associated symptoms: no neck pain, no swollen glands and no wheezing   Risk factors: sick contacts   Risk factors: no chronic respiratory disease and no diabetes mellitus   Risk factors comment:  Hx of HTN   Past Medical History  Diagnosis Date  . Hypertension    History reviewed. No pertinent past surgical history. History reviewed. No pertinent family history. Social History  Substance Use Topics  . Smoking status: Current Every Day Smoker -- 0.50 packs/day    Types: Cigarettes  . Smokeless tobacco: None  . Alcohol Use: Yes     Comment: seldom    Review of  Systems  Constitutional: Negative for fever.  HENT: Positive for congestion and rhinorrhea. Negative for sore throat.   Respiratory: Positive for cough. Negative for wheezing.   Musculoskeletal: Negative for neck pain.  Skin: Positive for rash.       Patient states he's had a rash for years and is gradually worsening with some itching  Neurological: Positive for headaches.      Allergies  Review of patient's allergies indicates no known allergies.  Home Medications   Prior to Admission medications   Medication Sig Start Date End Date Taking? Authorizing Provider  cloNIDine (CATAPRES) 0.1 MG tablet Take 1 tablet (0.1 mg total) by mouth 3 (three) times daily. 01/09/15   Stephanie Coup Street, MD  diltiazem (DILACOR XR) 240 MG 24 hr capsule Take 1 capsule (240 mg total) by mouth daily. 01/09/15   Stephanie Coup Street, MD  ibuprofen (ADVIL,MOTRIN) 800 MG tablet Take 1 tablet (800 mg total) by mouth 3 (three) times daily. 05/26/15   Elson Areas, PA-C  lisinopril-hydrochlorothiazide (ZESTORETIC) 20-12.5 MG per tablet Take 1 tablet by mouth daily. 12/15/14   Stephanie Coup Street, MD  potassium chloride (K-DUR,KLOR-CON) 10 MEQ tablet Take 1 tablet (10 mEq total) by mouth daily. Patient not taking: Reported on 12/12/2014 01/06/14   Deloris Ping Nahser, MD   BP 182/136 mmHg  Pulse 82  Temp(Src) 98.5 F (36.9 C) (Oral)  Resp 14  SpO2 100% Physical Exam  Constitutional: He is oriented to person, place, and time. He appears well-developed and well-nourished. No distress.  HENT:  Head:  Normocephalic and atraumatic.  Right Ear: A middle ear effusion is present.  Left Ear: A middle ear effusion is present.  Nose: Mucosal edema present.  Mouth/Throat: Oropharynx is clear and moist.  Eyes: Conjunctivae and EOM are normal. Pupils are equal, round, and reactive to light.  Neck: Normal range of motion. Neck supple.  Cardiovascular: Normal rate, regular rhythm and intact distal pulses.   No murmur  heard. Pulmonary/Chest: Effort normal and breath sounds normal. No respiratory distress. He has no wheezes. He has no rales.  Abdominal: Soft. He exhibits no distension. There is no tenderness. There is no rebound and no guarding.  Musculoskeletal: Normal range of motion. He exhibits no edema or tenderness.  Neurological: He is alert and oriented to person, place, and time.  Skin: Skin is warm and dry. Rash noted. No erythema.  Diffuse rash most pronounced over the back and upper arms that is slightly raised, hypopigmented and in circular patchy formation. No erythema  Psychiatric: He has a normal mood and affect. His behavior is normal.  Nursing note and vitals reviewed.   ED Course  Procedures (including critical care time) Labs Review Labs Reviewed - No data to display  Imaging Review Dg Chest 2 View  10/08/2015  CLINICAL DATA:  Chest and nasal congestion, cough. History of pneumonia. Hypertension. EXAM: CHEST  2 VIEW COMPARISON:  Chest x-ray dated 10/01/2012. FINDINGS: The heart size and mediastinal contours are within normal limits. Both lungs are clear. The visualized skeletal structures are unremarkable. IMPRESSION: Normal chest x-ray.  No evidence of pneumonia. Electronically Signed   By: Bary RichardStan  Maynard M.D.   On: 10/08/2015 17:30   I have personally reviewed and evaluated these images and lab results as part of my medical decision-making.   EKG Interpretation None      MDM   Final diagnoses:  URI (upper respiratory infection)  Tinea versicolor  Essential hypertension    Pt with symptoms consistent with URI for the last 1.5 weeks. He denies any shortness of breath or wheezing. He has no history of lung disease and does smoke cigarettes.  Well appearing here.  No signs of breathing difficulty  No signs of pharyngitis, otitis or abnormal abdominal findings.   CXR wnl and pt to return with any further problems.  Pt treated with z-pack due to length of sx and smoking hx to  treat for atypicals.  Secondly patient is hypertensive here without other complaints. He has been taking over-the-counter cough and cold preparations which is probably not helping his blood pressure but also he was on 3 separate blood pressure medications and ran out between 3 and 6 months ago and has just never followed up with his doctor nice chest pain, shortness of breath, headaches or visual changes. No unilateral weakness or numbness. Patient given a dose of his clonidine here and prescription given for all 3 of his medications and encouraged him to follow-up with his doctor.  Patient is also noted to have tinea versicolor covering some large portion of his body. He was given Diflucan and an antifungal wash.     Gwyneth SproutWhitney Saif Peter, MD 10/08/15 1745

## 2015-10-08 NOTE — ED Notes (Signed)
Pt escorted to discharge window. Pt verbalized understanding discharge instructions. In no acute distress.  

## 2015-10-08 NOTE — ED Notes (Signed)
Pt presents with complaints of nasal to chest congestion, headache that he acknowledges might be because his "BP has been running high." Endorses PMH of hypertension but endorses not taking his antihypertensives for a "long time." Denies chest pain, palpitations or shortness of breath.

## 2015-12-04 ENCOUNTER — Emergency Department (HOSPITAL_COMMUNITY)
Admission: EM | Admit: 2015-12-04 | Discharge: 2015-12-04 | Disposition: A | Discharge status: Home / Self Care | Payer: Self-pay | Attending: Emergency Medicine | Admitting: Emergency Medicine

## 2015-12-04 ENCOUNTER — Encounter (HOSPITAL_COMMUNITY): Payer: Self-pay

## 2015-12-04 ENCOUNTER — Emergency Department (HOSPITAL_COMMUNITY): Payer: Self-pay

## 2015-12-04 DIAGNOSIS — F17210 Nicotine dependence, cigarettes, uncomplicated: Secondary | ICD-10-CM | POA: Insufficient documentation

## 2015-12-04 DIAGNOSIS — J069 Acute upper respiratory infection, unspecified: Secondary | ICD-10-CM

## 2015-12-04 DIAGNOSIS — I10 Essential (primary) hypertension: Secondary | ICD-10-CM | POA: Insufficient documentation

## 2015-12-04 DIAGNOSIS — Z791 Long term (current) use of non-steroidal anti-inflammatories (NSAID): Secondary | ICD-10-CM | POA: Insufficient documentation

## 2015-12-04 DIAGNOSIS — I159 Secondary hypertension, unspecified: Secondary | ICD-10-CM

## 2015-12-04 DIAGNOSIS — J449 Chronic obstructive pulmonary disease, unspecified: Secondary | ICD-10-CM | POA: Insufficient documentation

## 2015-12-04 DIAGNOSIS — Z79899 Other long term (current) drug therapy: Secondary | ICD-10-CM | POA: Insufficient documentation

## 2015-12-04 MED ORDER — CLONIDINE HCL 0.1 MG PO TABS
0.1000 mg | ORAL_TABLET | Freq: Once | ORAL | Status: AC
  Administered 2015-12-04: 0.1 mg via ORAL
  Filled 2015-12-04: qty 1

## 2015-12-04 MED ORDER — AZITHROMYCIN 250 MG PO TABS: 250.0000 mg | ORAL_TABLET | Freq: Every day | ORAL | Status: DC

## 2015-12-04 NOTE — ED Provider Notes (Signed)
CSN: [id number]     Arrival date & time 12/04/15  1249 History  By signing my name below, I, Placido Sou, attest that this documentation has been prepared under the direction and in the presence of [employer], PA-C. Electronically Signed: Placido Sou, ED Scribe. 12/04/2015. 3:25 PM.     Chief Complaint  Patient presents with  . Nasal Congestion  . Cough   The history is provided by the patient. No language interpreter was used.   HPI Comments: RYDER MAN is a 40 y.o. male with a pmhx of HTN, COPD, tobacco use who presents to the Emergency Department complaining of constant, moderate, productive cough onset 4 days ago. He reports associated blood speckled green/yellow sputum produced when coughing, nasal and chest congestion, eye crusting when waking, 3x n/v earlier today but denies currently feeling nauseated, subjective fevers (97.7 F in triage) and intermittent diarrhea the past few days. Pt has a PMHx of HTN (151/107 mmHg in triage) noting that he missed his daily Clonidine which he typically takes in the morning. He endorses a hx of smoking. He denies abd pain, dysuria, ear pain, HA or any other associated symptoms at this time.    Past Medical History  Diagnosis Date  . Hypertension    History reviewed. No pertinent past surgical history. No family history on file. Social History  Substance Use Topics  . Smoking status: Current Every Day Smoker -- 0.50 packs/day    Types: Cigarettes  . Smokeless tobacco: None  . Alcohol Use: Yes     Comment: seldom    Review of Systems A complete 10 system review of systems was obtained and all systems are negative except as noted in the HPI and PMH.   Allergies  Review of patient's allergies indicates no known allergies.  Home Medications   Prior to Admission medications   Medication Sig Start Date End Date Taking? Authorizing Provider  acetaminophen (TYLENOL) 500 MG tablet Take 1,000 mg by mouth every 6 (six)  hours as needed for headache.    Historical Provider, MD  azithromycin (ZITHROMAX) 250 MG tablet Take 1 tablet (250 mg total) by mouth daily. Take first 2 tablets together, then 1 every day until finished. 10/08/15   Gwyneth Sprout, MD  calcium carbonate (TUMS - DOSED IN MG ELEMENTAL CALCIUM) 500 MG chewable tablet Chew 2 tablets by mouth daily as needed for indigestion or heartburn.    Historical Provider, MD  cloNIDine (CATAPRES) 0.1 MG tablet Take 1 tablet (0.1 mg total) by mouth 3 (three) times daily. 10/08/15   Gwyneth Sprout, MD  diltiazem (DILACOR XR) 240 MG 24 hr capsule Take 1 capsule (240 mg total) by mouth daily. 10/08/15   Gwyneth Sprout, MD  fluconazole (DIFLUCAN) 150 MG tablet Take 2 tablets (300 mg total) by mouth daily. Take 1 time on 10/15/15 10/08/15   Gwyneth Sprout, MD  ibuprofen (ADVIL,MOTRIN) 800 MG tablet Take 1 tablet (800 mg total) by mouth 3 (three) times daily. 05/26/15   Elson Areas, PA-C  lisinopril-hydrochlorothiazide (ZESTORETIC) 20-12.5 MG tablet Take 1 tablet by mouth daily. 10/08/15   Gwyneth Sprout, MD  Phenylephrine-APAP-Guaifenesin (EQ SINUS CONGESTION & PAIN) 5-325-200 MG TABS Take 2 tablets by mouth daily as needed (cold symptoms).    Historical Provider, MD  Selenium Sulfide 2.25 % SHAM Apply 10 mLs topically daily. Apply shampoo to the affected area daily for 1 week. Rinse shampoo off after 10 minutes 10/08/15   Gwyneth Sprout, MD   BP 151/107 mmHg  Pulse 85  Temp(Src) 97.7 F (36.5 C) (Oral)  Resp 18  SpO2 97%    Physical Exam  Constitutional: He is oriented to person, place, and time. He appears well-developed and well-nourished. No distress.  HENT:  Head: Normocephalic and atraumatic.  Mouth/Throat: No oropharyngeal exudate.  Eyes: Conjunctivae and EOM are normal. Pupils are equal, round, and reactive to light. Right eye exhibits no discharge. Left eye exhibits no discharge. No scleral icterus.  Neck: Normal range of motion. Neck  supple.  Cardiovascular: Normal rate, regular rhythm, normal heart sounds and intact distal pulses.  Exam reveals no gallop and no friction rub.   No murmur heard. Pulmonary/Chest: Effort normal and breath sounds normal. No respiratory distress. He has no wheezes. He has no rales. He exhibits no tenderness.  Abdominal: Soft. He exhibits no distension. There is no tenderness. There is no guarding.  Musculoskeletal: Normal range of motion. He exhibits no edema.  Lymphadenopathy:    He has no cervical adenopathy.  Neurological: He is alert and oriented to person, place, and time.  Skin: Skin is warm and dry. No rash noted. He is not diaphoretic. No erythema. No pallor.  Psychiatric: He has a normal mood and affect. His behavior is normal.  Nursing note and vitals reviewed.   ED Course  Procedures  DIAGNOSTIC STUDIES: Oxygen Saturation is 97% on RA, normal by my interpretation.    COORDINATION OF CARE: 3:22 PM Discussed next steps with pt including Clonidine and a CXR. He verbalized understanding and is agreeable with the plan.   Labs Review Labs Reviewed - No data to display  Imaging Review Dg Chest 2 View  12/04/2015  CLINICAL DATA:  Productive cough and sinus pressure for 4 days. Initial encounter. EXAM: CHEST  2 VIEW COMPARISON:  PA and lateral chest 10/08/2015. FINDINGS: The lungs are clear. Heart size is normal. There is no pneumothorax or pleural effusion. No bony abnormality. IMPRESSION: Negative chest. Electronically Signed   By: Drusilla Kanner M.D.   On: 12/04/2015 15:39   I have personally reviewed and evaluated these images as part of my medical decision-making.   EKG Interpretation None      MDM   Final diagnoses:  URI (upper respiratory infection)  Secondary hypertension, unspecified    Pt CXR negative for acute infiltrate. Patients symptoms are consistent with URI. Given duration of symptoms, hx of COPD and tobacco use will give azithromycin to cover atypicals.  Pt also hypertensive in ED, hx of same. Pt did not take his home BP meds today. Home dose of clonidine given ED with moderate reduction of BP. No sign of hypertensive urgency. Pt denies HA, visual disturbance. No neurological deficit. Discussed importance of compliance with BP medication. Encouraged to avoid sudafed for decongestant use. Encouraged smoking cessation. Pt Verbalizes understanding and is agreeable with plan. Pt is hemodynamically stable & in NAD prior to dc.   I personally performed the services described in this documentation, which was scribed in my presence. The recorded information has been reviewed and is accurate.     Lester Kinsman [place], PA-C 12/06/15 1610  Derwood Kaplan, MD 12/06/15 1622

## 2015-12-04 NOTE — Discharge Instructions (Signed)
Hypertension Hypertension, commonly called high blood pressure, is when the force of blood pumping through your arteries is too strong. Your arteries are the blood vessels that carry blood from your heart throughout your body. A blood pressure reading consists of a higher number over a lower number, such as 110/72. The higher number (systolic) is the pressure inside your arteries when your heart pumps. The lower number (diastolic) is the pressure inside your arteries when your heart relaxes. Ideally you want your blood pressure below 120/80. Hypertension forces your heart to work harder to pump blood. Your arteries may become narrow or stiff. Having untreated or uncontrolled hypertension can cause heart attack, stroke, kidney disease, and other problems. RISK FACTORS Some risk factors for high blood pressure are controllable. Others are not.  Risk factors you cannot control include:   Race. You may be at higher risk if you are African American.  Age. Risk increases with age.  Gender. Men are at higher risk than women before age 45 years. After age 65, women are at higher risk than men. Risk factors you can control include:  Not getting enough exercise or physical activity.  Being overweight.  Getting too much fat, sugar, calories, or salt in your diet.  Drinking too much alcohol. SIGNS AND SYMPTOMS Hypertension does not usually cause signs or symptoms. Extremely high blood pressure (hypertensive crisis) may cause headache, anxiety, shortness of breath, and nosebleed. DIAGNOSIS To check if you have hypertension, your health care provider will measure your blood pressure while you are seated, with your arm held at the level of your heart. It should be measured at least twice using the same arm. Certain conditions can cause a difference in blood pressure between your right and left arms. A blood pressure reading that is higher than normal on one occasion does not mean that you need treatment. If  it is not clear whether you have high blood pressure, you may be asked to return on a different day to have your blood pressure checked again. Or, you may be asked to monitor your blood pressure at home for 1 or more weeks. TREATMENT Treating high blood pressure includes making lifestyle changes and possibly taking medicine. Living a healthy lifestyle can help lower high blood pressure. You may need to change some of your habits. Lifestyle changes may include:  Following the DASH diet. This diet is high in fruits, vegetables, and whole grains. It is low in salt, red meat, and added sugars.  Keep your sodium intake below 2,300 mg per day.  Getting at least 30-45 minutes of aerobic exercise at least 4 times per week.  Losing weight if necessary.  Not smoking.  Limiting alcoholic beverages.  Learning ways to reduce stress. Your health care provider may prescribe medicine if lifestyle changes are not enough to get your blood pressure under control, and if one of the following is true:  You are 18-59 years of age and your systolic blood pressure is above 140.  You are 60 years of age or older, and your systolic blood pressure is above 150.  Your diastolic blood pressure is above 90.  You have diabetes, and your systolic blood pressure is over 140 or your diastolic blood pressure is over 90.  You have kidney disease and your blood pressure is above 140/90.  You have heart disease and your blood pressure is above 140/90. Your personal target blood pressure may vary depending on your medical conditions, your age, and other factors. HOME CARE INSTRUCTIONS    Have your blood pressure rechecked as directed by your health care provider.   °· Take medicines only as directed by your health care provider. Follow the directions carefully. Blood pressure medicines must be taken as prescribed. The medicine does not work as well when you skip doses. Skipping doses also puts you at risk for  problems. °· Do not smoke.   °· Monitor your blood pressure at home as directed by your health care provider.  °SEEK MEDICAL CARE IF:  °· You think you are having a reaction to medicines taken. °· You have recurrent headaches or feel dizzy. °· You have swelling in your ankles. °· You have trouble with your vision. °SEEK IMMEDIATE MEDICAL CARE IF: °· You develop a severe headache or confusion. °· You have unusual weakness, numbness, or feel faint. °· You have severe chest or abdominal pain. °· You vomit repeatedly. °· You have trouble breathing. °MAKE SURE YOU:  °· Understand these instructions. °· Will watch your condition. °· Will get help right away if you are not doing well or get worse. °  °This information is not intended to replace advice given to you by your health care provider. Make sure you discuss any questions you have with your health care provider. °  °Document Released: 10/07/2005 Document Revised: 02/21/2015 Document Reviewed: 07/30/2013 °Elsevier Interactive Patient Education ©2016 [employer] ° °Upper Respiratory Infection, Adult °Most upper respiratory infections (URIs) are a viral infection of the air passages leading to the lungs. A URI affects the nose, throat, and upper air passages. The most common type of URI is nasopharyngitis and is typically referred to as "the common cold." °URIs run their course and usually go away on their own. Most of the time, a URI does not require medical attention, but sometimes a bacterial infection in the upper airways can follow a viral infection. This is called a secondary infection. Sinus and middle ear infections are common types of secondary upper respiratory infections. °Bacterial pneumonia can also complicate a URI. A URI can worsen asthma and chronic obstructive pulmonary disease (COPD). Sometimes, these complications can require emergency medical care and may be life threatening.  °CAUSES °Almost all URIs are caused by viruses. A virus is a type of  germ and can spread from one person to another.  °RISKS FACTORS °You may be at risk for a URI if:  °· You smoke.   °· You have chronic heart or lung disease. °· You have a weakened defense (immune) system.   °· You are very young or very old.   °· You have nasal allergies or asthma. °· You work in crowded or poorly ventilated areas. °· You work in health care facilities or schools. °SIGNS AND SYMPTOMS  °Symptoms typically develop 2-3 days after you come in contact with a cold virus. Most viral URIs last 7-10 days. However, viral URIs from the influenza virus (flu virus) can last 14-18 days and are typically more severe. Symptoms may include:  °· Runny or stuffy (congested) nose.   °· Sneezing.   °· Cough.   °· Sore throat.   °· Headache.   °· Fatigue.   °· Fever.   °· Loss of appetite.   °· Pain in your forehead, behind your eyes, and over your cheekbones (sinus pain). °· Muscle aches.   °DIAGNOSIS  °Your health care provider may diagnose a URI by: °· Physical exam. °· Tests to check that your symptoms are not due to another condition such as: °¨ Strep throat. °¨ Sinusitis. °¨ Pneumonia. °¨ Asthma. °TREATMENT  °A URI goes away on its own   with time. It cannot be cured with medicines, but medicines may be prescribed or recommended to relieve symptoms. Medicines may help:  Reduce your fever.  Reduce your cough.  Relieve nasal congestion. HOME CARE INSTRUCTIONS   Take medicines only as directed by your health care provider.   Gargle warm saltwater or take cough drops to comfort your throat as directed by your health care provider.  Use a warm mist humidifier or inhale steam from a shower to increase air moisture. This may make it easier to breathe.  Drink enough fluid to keep your urine clear or pale yellow.   Eat soups and other clear broths and maintain good nutrition.   Rest as needed.   Return to work when your temperature has returned to normal or as your health care provider advises. You  may need to stay home longer to avoid infecting others. You can also use a face mask and careful hand washing to prevent spread of the virus.  Increase the usage of your inhaler if you have asthma.   Do not use any tobacco products, including cigarettes, chewing tobacco, or electronic cigarettes. If you need help quitting, ask your health care provider. PREVENTION  The best way to protect yourself from getting a cold is to practice good hygiene.   Avoid oral or hand contact with people with cold symptoms.   Wash your hands often if contact occurs.  There is no clear evidence that vitamin C, vitamin E, echinacea, or exercise reduces the chance of developing a cold. However, it is always recommended to get plenty of rest, exercise, and practice good nutrition.  SEEK MEDICAL CARE IF:   You are getting worse rather than better.   Your symptoms are not controlled by medicine.   You have chills.  You have worsening shortness of breath.  You have brown or red mucus.  You have yellow or brown nasal discharge.  You have pain in your face, especially when you bend forward.  You have a fever.  You have swollen neck glands.  You have pain while swallowing.  You have white areas in the back of your throat. SEEK IMMEDIATE MEDICAL CARE IF:   You have severe or persistent:  Headache.  Ear pain.  Sinus pain.  Chest pain.  You have chronic lung disease and any of the following:  Wheezing.  Prolonged cough.  Coughing up blood.  A change in your usual mucus.  You have a stiff neck.  You have changes in your:  Vision.  Hearing.  Thinking.  Mood. MAKE SURE YOU:   Understand these instructions.  Will watch your condition.  Will get help right away if you are not doing well or get worse.   This information is not intended to replace advice given to you by your health care provider. Make sure you discuss any questions you have with your health care provider.    Follow-up with your primary care provider for symptoms don't improve. Take the remainder of your blood pressure medications today. Take antibiotics as prescribed. Encourage smoking cessation. Return to the emergency department if you experience severe worsening of your symptoms, fever, chills, chest pain, shortness of breath, severe headache, blurry vision.

## 2015-12-04 NOTE — ED Notes (Signed)
Pt presents with c/o nasal congestion and cough that started approx 4 days ago. Pt reports he is coughing up greenish/yellow sputum with specks of blood, reports today the color is more yellow in color.

## 2017-04-06 ENCOUNTER — Encounter (HOSPITAL_COMMUNITY): Payer: Self-pay | Admitting: Emergency Medicine

## 2017-04-06 ENCOUNTER — Emergency Department (HOSPITAL_COMMUNITY)

## 2017-04-06 ENCOUNTER — Observation Stay (HOSPITAL_COMMUNITY): Admission: EM | Admit: 2017-04-06 | Discharge: 2017-04-07 | Attending: Internal Medicine | Admitting: Internal Medicine

## 2017-04-06 DIAGNOSIS — R079 Chest pain, unspecified: Secondary | ICD-10-CM | POA: Insufficient documentation

## 2017-04-06 DIAGNOSIS — E876 Hypokalemia: Secondary | ICD-10-CM | POA: Diagnosis present

## 2017-04-06 DIAGNOSIS — I10 Essential (primary) hypertension: Secondary | ICD-10-CM | POA: Diagnosis not present

## 2017-04-06 DIAGNOSIS — J45909 Unspecified asthma, uncomplicated: Secondary | ICD-10-CM | POA: Insufficient documentation

## 2017-04-06 DIAGNOSIS — I214 Non-ST elevation (NSTEMI) myocardial infarction: Secondary | ICD-10-CM

## 2017-04-06 DIAGNOSIS — R9431 Abnormal electrocardiogram [ECG] [EKG]: Secondary | ICD-10-CM | POA: Diagnosis not present

## 2017-04-06 DIAGNOSIS — F1910 Other psychoactive substance abuse, uncomplicated: Secondary | ICD-10-CM | POA: Diagnosis present

## 2017-04-06 DIAGNOSIS — I161 Hypertensive emergency: Principal | ICD-10-CM | POA: Diagnosis present

## 2017-04-06 DIAGNOSIS — Z720 Tobacco use: Secondary | ICD-10-CM | POA: Diagnosis present

## 2017-04-06 DIAGNOSIS — Z791 Long term (current) use of non-steroidal anti-inflammatories (NSAID): Secondary | ICD-10-CM | POA: Diagnosis not present

## 2017-04-06 DIAGNOSIS — Z79899 Other long term (current) drug therapy: Secondary | ICD-10-CM | POA: Insufficient documentation

## 2017-04-06 DIAGNOSIS — F17210 Nicotine dependence, cigarettes, uncomplicated: Secondary | ICD-10-CM | POA: Diagnosis not present

## 2017-04-06 DIAGNOSIS — R51 Headache: Secondary | ICD-10-CM | POA: Insufficient documentation

## 2017-04-06 LAB — BASIC METABOLIC PANEL
ANION GAP: 8 (ref 5–15)
BUN: 8 mg/dL (ref 6–20)
CHLORIDE: 101 mmol/L (ref 101–111)
CO2: 27 mmol/L (ref 22–32)
Calcium: 9.8 mg/dL (ref 8.9–10.3)
Creatinine, Ser: 0.95 mg/dL (ref 0.61–1.24)
Glucose, Bld: 100 mg/dL — ABNORMAL HIGH (ref 65–99)
POTASSIUM: 3 mmol/L — AB (ref 3.5–5.1)
SODIUM: 136 mmol/L (ref 135–145)

## 2017-04-06 LAB — CBC
HEMATOCRIT: 46.2 % (ref 39.0–52.0)
Hemoglobin: 16.1 g/dL (ref 13.0–17.0)
MCH: 30.6 pg (ref 26.0–34.0)
MCHC: 34.8 g/dL (ref 30.0–36.0)
MCV: 87.8 fL (ref 78.0–100.0)
PLATELETS: 245 10*3/uL (ref 150–400)
RBC: 5.26 MIL/uL (ref 4.22–5.81)
RDW: 12.9 % (ref 11.5–15.5)
WBC: 5.2 10*3/uL (ref 4.0–10.5)

## 2017-04-06 LAB — I-STAT TROPONIN, ED: Troponin i, poc: 0.02 ng/mL (ref 0.00–0.08)

## 2017-04-06 NOTE — ED Triage Notes (Signed)
Per EMS, pt from jail. Pt c/o cp around 6pm tonight. Pt had some relief with sublingual nitro, then pt c/o a headache. Pt reports his cp started back up about am hour ago, had another nitro with not as much relief and his headache increased. EMS gave 324 ASA. EMS reports manual BP was 220/120 at first and later it was 160/94 automatically. EMS states pt was sinus with frequent PVCs. Pt states his headache is an 8/10 and cp is 4/10 currently. Pt also reports cocaine and marijuana use everyday with a decrease in appetite.

## 2017-04-07 ENCOUNTER — Observation Stay (HOSPITAL_BASED_OUTPATIENT_CLINIC_OR_DEPARTMENT_OTHER)

## 2017-04-07 ENCOUNTER — Encounter (HOSPITAL_COMMUNITY): Payer: Self-pay | Admitting: Internal Medicine

## 2017-04-07 DIAGNOSIS — I160 Hypertensive urgency: Secondary | ICD-10-CM

## 2017-04-07 DIAGNOSIS — I161 Hypertensive emergency: Secondary | ICD-10-CM | POA: Diagnosis not present

## 2017-04-07 DIAGNOSIS — E876 Hypokalemia: Secondary | ICD-10-CM | POA: Diagnosis not present

## 2017-04-07 DIAGNOSIS — Z720 Tobacco use: Secondary | ICD-10-CM | POA: Diagnosis present

## 2017-04-07 DIAGNOSIS — R079 Chest pain, unspecified: Secondary | ICD-10-CM

## 2017-04-07 DIAGNOSIS — I214 Non-ST elevation (NSTEMI) myocardial infarction: Secondary | ICD-10-CM | POA: Diagnosis not present

## 2017-04-07 DIAGNOSIS — F1910 Other psychoactive substance abuse, uncomplicated: Secondary | ICD-10-CM | POA: Diagnosis present

## 2017-04-07 LAB — NM MYOCAR MULTI W/SPECT W/WALL MOTION / EF
Estimated workload: 1 METS
MPHR: 180 {beats}/min
Peak HR: 86 {beats}/min
Percent HR: 47 %
Rest HR: 65 {beats}/min

## 2017-04-07 LAB — CBC
HEMATOCRIT: 48.8 % (ref 39.0–52.0)
Hemoglobin: 16.7 g/dL (ref 13.0–17.0)
MCH: 30.8 pg (ref 26.0–34.0)
MCHC: 34.2 g/dL (ref 30.0–36.0)
MCV: 89.9 fL (ref 78.0–100.0)
Platelets: 254 10*3/uL (ref 150–400)
RBC: 5.43 MIL/uL (ref 4.22–5.81)
RDW: 13.1 % (ref 11.5–15.5)
WBC: 5.6 10*3/uL (ref 4.0–10.5)

## 2017-04-07 LAB — MRSA PCR SCREENING: MRSA by PCR: POSITIVE — AB

## 2017-04-07 LAB — RAPID URINE DRUG SCREEN, HOSP PERFORMED
AMPHETAMINES: NOT DETECTED
Barbiturates: NOT DETECTED
Benzodiazepines: NOT DETECTED
Cocaine: NOT DETECTED
OPIATES: NOT DETECTED
Tetrahydrocannabinol: POSITIVE — AB

## 2017-04-07 LAB — TROPONIN I
Troponin I: <0.03 ng/mL (ref <0.03)
Troponin I: <0.03 ng/mL (ref <0.03)
Troponin I: <0.03 ng/mL (ref <0.03)

## 2017-04-07 LAB — LIPID PANEL
CHOLESTEROL: 138 mg/dL (ref 0–200)
HDL: 58 mg/dL (ref >40)
LDL Cholesterol: 59 mg/dL (ref 0–99)
Total CHOL/HDL Ratio: 2.4 RATIO
Triglycerides: 104 mg/dL (ref <150)
VLDL: 21 mg/dL (ref 0–40)

## 2017-04-07 LAB — BASIC METABOLIC PANEL
Anion gap: 10 (ref 5–15)
BUN: 9 mg/dL (ref 6–20)
CHLORIDE: 102 mmol/L (ref 101–111)
CO2: 26 mmol/L (ref 22–32)
Calcium: 9.5 mg/dL (ref 8.9–10.3)
Creatinine, Ser: 1.04 mg/dL (ref 0.61–1.24)
GFR calc Af Amer: >60 mL/min (ref >60)
GFR calc non Af Amer: >60 mL/min (ref >60)
GLUCOSE: 90 mg/dL (ref 65–99)
POTASSIUM: 3.7 mmol/L (ref 3.5–5.1)
SODIUM: 138 mmol/L (ref 135–145)

## 2017-04-07 LAB — I-STAT TROPONIN, ED: TROPONIN I, POC: 0.13 ng/mL — AB (ref 0.00–0.08)

## 2017-04-07 LAB — MAGNESIUM: Magnesium: 2.1 mg/dL (ref 1.7–2.4)

## 2017-04-07 LAB — HIV ANTIBODY (ROUTINE TESTING W REFLEX): HIV SCREEN 4TH GENERATION: NONREACTIVE

## 2017-04-07 MED ORDER — ALBUTEROL SULFATE (2.5 MG/3ML) 0.083% IN NEBU: 2.5000 mg | INHALATION_SOLUTION | RESPIRATORY_TRACT | Status: DC | PRN

## 2017-04-07 MED ORDER — TECHNETIUM TC 99M TETROFOSMIN IV KIT
10.0000 | PACK | Freq: Once | INTRAVENOUS | Status: AC | PRN
  Administered 2017-04-07: 10 via INTRAVENOUS

## 2017-04-07 MED ORDER — SODIUM CHLORIDE 0.9 % IV SOLN: INTRAVENOUS | Status: DC

## 2017-04-07 MED ORDER — POTASSIUM CHLORIDE 20 MEQ/15ML (10%) PO SOLN
40.0000 meq | Freq: Once | ORAL | Status: AC
  Administered 2017-04-07: 40 meq via ORAL
  Filled 2017-04-07: qty 30

## 2017-04-07 MED ORDER — LISINOPRIL 20 MG PO TABS: 20.0000 mg | ORAL_TABLET | Freq: Once | ORAL | Status: DC

## 2017-04-07 MED ORDER — OXYCODONE-ACETAMINOPHEN 5-325 MG PO TABS: 2.0000 | ORAL_TABLET | ORAL | Status: DC | PRN

## 2017-04-07 MED ORDER — LISINOPRIL-HYDROCHLOROTHIAZIDE 20-12.5 MG PO TABS: 1.0000 | ORAL_TABLET | Freq: Every day | ORAL | Status: DC

## 2017-04-07 MED ORDER — ONDANSETRON HCL 4 MG/2ML IJ SOLN: 4.0000 mg | Freq: Four times a day (QID) | INTRAMUSCULAR | Status: DC | PRN

## 2017-04-07 MED ORDER — LISINOPRIL 10 MG PO TABS
20.0000 mg | ORAL_TABLET | Freq: Once | ORAL | Status: AC
  Administered 2017-04-07: 20 mg via ORAL
  Filled 2017-04-07: qty 2

## 2017-04-07 MED ORDER — CLONIDINE HCL 0.1 MG PO TABS: 0.1000 mg | ORAL_TABLET | Freq: Three times a day (TID) | ORAL | Status: DC

## 2017-04-07 MED ORDER — SELENIUM SULFIDE 2.25 % EX SHAM: 10.0000 mL | MEDICATED_SHAMPOO | Freq: Every day | CUTANEOUS | Status: DC

## 2017-04-07 MED ORDER — ATORVASTATIN CALCIUM 40 MG PO TABS
40.0000 mg | ORAL_TABLET | Freq: Every day | ORAL | Status: DC
  Administered 2017-04-07: 40 mg via ORAL
  Filled 2017-04-07: qty 1

## 2017-04-07 MED ORDER — LISINOPRIL 40 MG PO TABS: 40.0000 mg | ORAL_TABLET | Freq: Every day | ORAL | 0 refills | Status: DC

## 2017-04-07 MED ORDER — CHLORHEXIDINE GLUCONATE CLOTH 2 % EX PADS: 6.0000 | MEDICATED_PAD | Freq: Every day | CUTANEOUS | Status: DC

## 2017-04-07 MED ORDER — MUPIROCIN 2 % EX OINT
1.0000 "application " | TOPICAL_OINTMENT | Freq: Two times a day (BID) | CUTANEOUS | Status: DC
  Administered 2017-04-07: 1 via NASAL
  Filled 2017-04-07: qty 22

## 2017-04-07 MED ORDER — DILTIAZEM HCL ER COATED BEADS 240 MG PO CP24
240.0000 mg | ORAL_CAPSULE | Freq: Every day | ORAL | Status: DC
  Administered 2017-04-07: 240 mg via ORAL
  Filled 2017-04-07 (×2): qty 1

## 2017-04-07 MED ORDER — ASPIRIN 325 MG PO TABS
325.0000 mg | ORAL_TABLET | Freq: Every day | ORAL | Status: DC
  Administered 2017-04-07: 325 mg via ORAL
  Filled 2017-04-07: qty 1

## 2017-04-07 MED ORDER — LISINOPRIL 40 MG PO TABS: 40.0000 mg | ORAL_TABLET | Freq: Every day | ORAL | Status: DC

## 2017-04-07 MED ORDER — ZOLPIDEM TARTRATE 5 MG PO TABS: 5.0000 mg | ORAL_TABLET | Freq: Every evening | ORAL | Status: DC | PRN

## 2017-04-07 MED ORDER — REGADENOSON 0.4 MG/5ML IV SOLN
INTRAVENOUS | Status: AC
  Administered 2017-04-07: 0.4 mg via INTRAVENOUS
  Filled 2017-04-07: qty 5

## 2017-04-07 MED ORDER — CLONIDINE HCL 0.1 MG PO TABS
0.1000 mg | ORAL_TABLET | Freq: Three times a day (TID) | ORAL | Status: DC
  Administered 2017-04-07 (×2): 0.1 mg via ORAL
  Filled 2017-04-07 (×2): qty 1

## 2017-04-07 MED ORDER — HYDROCHLOROTHIAZIDE 25 MG PO TABS: 25.0000 mg | ORAL_TABLET | Freq: Every day | ORAL | Status: DC

## 2017-04-07 MED ORDER — TECHNETIUM TC 99M TETROFOSMIN IV KIT
30.0000 | PACK | Freq: Once | INTRAVENOUS | Status: AC | PRN
  Administered 2017-04-07: 30 via INTRAVENOUS

## 2017-04-07 MED ORDER — HYDROCHLOROTHIAZIDE 12.5 MG PO CAPS: 12.5000 mg | ORAL_CAPSULE | Freq: Every day | ORAL | Status: DC

## 2017-04-07 MED ORDER — SELENIUM SULFIDE 1 % EX LOTN
TOPICAL_LOTION | Freq: Every day | CUTANEOUS | Status: DC
  Filled 2017-04-07: qty 207

## 2017-04-07 MED ORDER — LISINOPRIL 10 MG PO TABS
20.0000 mg | ORAL_TABLET | Freq: Every day | ORAL | Status: DC
  Administered 2017-04-07: 20 mg via ORAL
  Filled 2017-04-07: qty 2

## 2017-04-07 MED ORDER — REGADENOSON 0.4 MG/5ML IV SOLN
0.4000 mg | Freq: Once | INTRAVENOUS | Status: AC
  Administered 2017-04-07: 0.4 mg via INTRAVENOUS
  Filled 2017-04-07: qty 5

## 2017-04-07 MED ORDER — NITROGLYCERIN 0.4 MG SL SUBL: 0.4000 mg | SUBLINGUAL_TABLET | SUBLINGUAL | Status: DC | PRN

## 2017-04-07 MED ORDER — ENOXAPARIN SODIUM 40 MG/0.4ML ~~LOC~~ SOLN
40.0000 mg | SUBCUTANEOUS | Status: DC
  Administered 2017-04-07: 40 mg via SUBCUTANEOUS
  Filled 2017-04-07: qty 0.4

## 2017-04-07 MED ORDER — CLONIDINE HCL 0.1 MG PO TABS: 0.1000 mg | ORAL_TABLET | Freq: Once | ORAL | Status: DC

## 2017-04-07 MED ORDER — HYDROCHLOROTHIAZIDE 25 MG PO TABS: 25.0000 mg | ORAL_TABLET | Freq: Every day | ORAL | 0 refills | Status: DC

## 2017-04-07 MED ORDER — ACETAMINOPHEN 325 MG PO TABS: 650.0000 mg | ORAL_TABLET | ORAL | Status: DC | PRN

## 2017-04-07 MED ORDER — HYDRALAZINE HCL 20 MG/ML IJ SOLN: 5.0000 mg | INTRAMUSCULAR | Status: DC | PRN

## 2017-04-07 MED ORDER — HYDROCHLOROTHIAZIDE 12.5 MG PO CAPS
12.5000 mg | ORAL_CAPSULE | Freq: Every day | ORAL | Status: DC
  Administered 2017-04-07: 12.5 mg via ORAL
  Filled 2017-04-07: qty 1

## 2017-04-07 MED ORDER — ATORVASTATIN CALCIUM 40 MG PO TABS: 40.0000 mg | ORAL_TABLET | Freq: Every day | ORAL | 0 refills | Status: DC

## 2017-04-07 MED ORDER — NICOTINE 21 MG/24HR TD PT24
21.0000 mg | MEDICATED_PATCH | Freq: Every day | TRANSDERMAL | Status: DC
  Administered 2017-04-07: 21 mg via TRANSDERMAL
  Filled 2017-04-07: qty 1

## 2017-04-07 MED ORDER — CALCIUM CARBONATE ANTACID 500 MG PO CHEW
2.0000 | CHEWABLE_TABLET | Freq: Every day | ORAL | Status: DC | PRN
  Filled 2017-04-07: qty 2

## 2017-04-07 MED ORDER — PHENYLEPHRINE-APAP-GUAIFENESIN 5-325-200 MG PO TABS: 2.0000 | ORAL_TABLET | Freq: Every day | ORAL | Status: DC | PRN

## 2017-04-07 NOTE — Progress Notes (Signed)
*  PRELIMINARY RESULTS* Echocardiogram 2D Echocardiogram has been performed.  Knoxx Boeding T Alizia Greif 04/07/2017, 11:58 AM

## 2017-04-07 NOTE — Discharge Summary (Addendum)
Physician Discharge Summary  Stephen West:[id number] DOB: [birth year] DOA: 04/06/2017  PCP: Patient, No Pcp Per  Admit date: 04/06/2017 Discharge date: 04/07/2017   Recommendations for Outpatient Follow-Up:   1. Establish with PCP for BP management 2. Needs titration of BP medications   Discharge Diagnosis:   Principal Problem:   Hypertensive emergency Active Problems:   HTN (hypertension)   Polysubstance abuse   Tobacco abuse   NSTEMI (non-ST elevated myocardial infarction) (HCC)   Hypokalemia   Discharge disposition:  Jail  Discharge Condition: Improved.  Diet recommendation: Low sodium, heart healthy  Wound care: None.   History of Present Illness:   Stephen West is a 40 y.o. male with medical history significant of hypertension, asthma, polysubstance abuse including tobacco, cocaine and marijuana, who presents with chest pain.  Patient states that his chest pain started at about 6 PM. It is located in the substernal area, constant, 6 out of 10 in severity, nonradiating. It is not aggravated or alleviated by any factors. He does not have tenderness in the calf areas. He also has headache. No cough, SOB, fever or chills. Patient denies nausea, vomiting, diarrhea, abdominal pain, symptoms of UTI or unilateral weakness. Per report, patient blood pressure was 220/120, which improved to 159/118 after treated with nitroglycerin.    Hospital Course by Problem:   Chest pain -read as intermediate but discussed with Dr. Delton See (cards) and actually low risk stress test -echo: Normal LV size with LV hypertrophy pattern consistent with apical   hypertrophic cardiomyopathy. Moderate diastolic dysfunction. EF   55-60%. Normal RV size with mildly decreased systolic function.   No significant valvular abnormalities. -added statin -encouraged illegal drug cessation  Hypokalemia -repleted  HTN -modified medications to below regiment -continue outpatient  titration  Medical Consultants:   cards  Discharge Exam:   Vitals:   04/07/17 1347 04/07/17 1446  BP: 102/69 113/64  Pulse:  71  Resp:  20  Temp:  97.6 F (36.4 C)   Vitals:   04/07/17 1344 04/07/17 1345 04/07/17 1347 04/07/17 1446  BP: 95/67 (!) 82/64 102/69 113/64  Pulse:    71  Resp:    20  Temp:    97.6 F (36.4 C)  TempSrc:    Oral  SpO2:    100%  Weight:      Height:        Gen:  NAD    The results of significant diagnostics from this hospitalization (including imaging, microbiology, ancillary and laboratory) are listed below for reference.     Procedures and Diagnostic Studies:   Dg Chest 2 View  Result Date: 04/06/2017 CLINICAL DATA:  Chest pain tonight. EXAM: CHEST  2 VIEW COMPARISON:  Radiographs 12/04/2015 FINDINGS: Normal heart size and mediastinal contours. Borderline hyperinflation. Possible trace right pleural effusion. No pulmonary edema, focal consolidation or pneumothorax. No acute osseous abnormalities. IMPRESSION: Possible small right pleural effusion.  Lungs otherwise clear. Electronically Signed   By: Rubye Oaks M.D.   On: 04/06/2017 23:56   Nm Myocar Multi W/spect W/wall Motion / Ef  Result Date: 04/07/2017 CLINICAL DATA:  41 year old male with chest pain and prior mark current infarction. EXAM: MYOCARDIAL IMAGING WITH SPECT (REST AND PHARMACOLOGIC-STRESS) GATED LEFT VENTRICULAR WALL MOTION STUDY LEFT VENTRICULAR EJECTION FRACTION TECHNIQUE: Standard myocardial SPECT imaging was performed after resting intravenous injection of 10 mCi Tc-32m tetrofosmin. Subsequently, intravenous infusion of Lexiscan was performed under the supervision of the Cardiology staff. At peak effect of the drug, 30 mCi  Tc-83m tetrofosmin was injected intravenously and standard myocardial SPECT imaging was performed. Quantitative gated imaging was also performed to evaluate left ventricular wall motion, and estimate left ventricular ejection fraction. COMPARISON:  None.  FINDINGS: Perfusion: Decrease counts in the inferior septal wall fixed on stress and rest. Favored diaphragmatic attenuation. No evidence reversible ischemia or infarction. Wall Motion: Global hypokinesia.  No left ventricular dilation. Left Ventricular Ejection Fraction: 45 % End diastolic volume 140 ml End systolic volume 76 ml IMPRESSION: 1. No reversible ischemia or infarction. 2. Global hypokinesia. 3. Left ventricular ejection fraction 45% 4. Non invasive risk stratification*: Intermediate (based on low ejection fraction) *2012 Appropriate Use Criteria for Coronary Revascularization Focused Update: J Am Coll Cardiol. 2012;59(9):857-881. http://content.dementiazones.com.aspx?articleid=[email or web address] Electronically Signed   By: Genevive Bi M.D.   On: 04/07/2017 16:44     Labs:   Basic Metabolic Panel:  Recent Labs Lab 04/06/17 2306 04/07/17 0458 04/07/17 1025  NA 136  --  138  K 3.0*  --  3.7  CL 101  --  102  CO2 27  --  26  GLUCOSE 100*  --  90  BUN 8  --  9  CREATININE 0.95  --  1.04  CALCIUM 9.8  --  9.5  MG  --  2.1  --    GFR Estimated Creatinine Clearance: 113 mL/min (by C-G formula based on SCr of 1.04 mg/dL). Liver Function Tests: No results for input(s): AST, ALT, ALKPHOS, BILITOT, PROT, ALBUMIN in the last 168 hours. No results for input(s): LIPASE, AMYLASE in the last 168 hours. No results for input(s): AMMONIA in the last 168 hours. Coagulation profile No results for input(s): INR, PROTIME in the last 168 hours.  CBC:  Recent Labs Lab 04/06/17 2306 04/07/17 1025  WBC 5.2 5.6  HGB 16.1 16.7  HCT 46.2 48.8  MCV 87.8 89.9  PLT 245 254   Cardiac Enzymes:  Recent Labs Lab 04/07/17 0458 04/07/17 1025  TROPONINI <0.03 <0.03   BNP: Invalid input(s): POCBNP CBG: No results for input(s): GLUCAP in the last 168 hours. D-Dimer No results for input(s): DDIMER in the last 72 hours. Hgb A1c No results for input(s): HGBA1C in the last 72 hours. Lipid  Profile  Recent Labs  04/07/17 0458  CHOL 138  HDL 58  LDLCALC 59  TRIG 104  CHOLHDL 2.4   Thyroid function studies No results for input(s): TSH, T4TOTAL, T3FREE, THYROIDAB in the last 72 hours.  Invalid input(s): FREET3 Anemia work up No results for input(s): VITAMINB12, FOLATE, FERRITIN, TIBC, IRON, RETICCTPCT in the last 72 hours. Microbiology Recent Results (from the past 240 hour(s))  MRSA PCR Screening     Status: Abnormal   Collection Time: 04/07/17  6:36 AM  Result Value Ref Range Status   MRSA by PCR POSITIVE (A) NEGATIVE Final    Comment:        The GeneXpert MRSA Assay (FDA approved for NASAL specimens only), is one component of a comprehensive MRSA colonization surveillance program. It is not intended to diagnose MRSA infection nor to guide or monitor treatment for MRSA infections. RESULT CALLED TO, READ BACK BY AND VERIFIED WITH: A. JACOBS [id number] 06.18.2018 N.MORRIS       Discharge Instructions:   Discharge Instructions    Diet - low sodium heart healthy    Complete by:  As directed    Discharge instructions    Complete by:  As directed    Monitor blood pressure closely- titrate mediations as needed  Increase activity slowly    Complete by:  As directed      Allergies as of 04/07/2017      Reactions   Banana Other (See Comments)   Unknown   Carrot [daucus Carota] Other (See Comments)   Unknown      Medication List    STOP taking these medications   amLODipine 10 MG tablet Commonly known as:  NORVASC   azithromycin 250 MG tablet Commonly known as:  ZITHROMAX   fluconazole 150 MG tablet Commonly known as:  DIFLUCAN   ibuprofen 800 MG tablet Commonly known as:  ADVIL,MOTRIN   lisinopril-hydrochlorothiazide 20-12.5 MG tablet Commonly known as:  ZESTORETIC   Selenium Sulfide 2.25 % Sham     TAKE these medications   atorvastatin 40 MG tablet Commonly known as:  LIPITOR Take 1 tablet (40 mg total) by mouth daily at 6 PM.     cloNIDine 0.1 MG tablet Commonly known as:  CATAPRES Take 1 tablet (0.1 mg total) by mouth 3 (three) times daily.   diltiazem 240 MG 24 hr capsule Commonly known as:  DILACOR XR Take 1 capsule (240 mg total) by mouth daily.   hydrochlorothiazide 25 MG tablet Commonly known as:  HYDRODIURIL Take 1 tablet (25 mg total) by mouth daily. Start taking on:  04/08/2017   lisinopril 40 MG tablet Commonly known as:  PRINIVIL,ZESTRIL Take 1 tablet (40 mg total) by mouth daily. Start taking on:  04/08/2017      Follow-up Information    to establish with PCP when out of jail Follow up.            Time coordinating discharge: 25 min  Signed:  Matthias Bogus U Tae Vonada   [facility] 04/07/2017, 5:04 PM

## 2017-04-07 NOTE — ED Notes (Signed)
ED Provider at bedside. 

## 2017-04-07 NOTE — Progress Notes (Signed)
  2D Echocardiogram has been performed.  Stephen West T Stephen West 04/07/2017, 11:58 AM

## 2017-04-07 NOTE — H&P (Signed)
History and Physical    Stephen Salinesaron D Cessna WUJ:[id number] DOB: Jul 30, 1976 DOA: 04/06/2017  Referring MD/NP/PA:   PCP: Patient, No Pcp Per   Patient coming from:  The patient is coming from jail.  At baseline, pt is independent for most of ADL.   Chief Complaint: chest pain  HPI: Stephen West is a 41 y.o. male with medical history significant of hypertension, asthma, polysubstance abuse including tobacco, cocaine and marijuana, who presents with chest pain.  Patient states that his chest pain started at about 6 PM. It is located in the substernal area, constant, 6 out of 10 in severity, nonradiating. It is not aggravated or alleviated by any factors. He does not have tenderness in the calf areas. He also has headache. No cough, SOB, fever or chills. Patient denies nausea, vomiting, diarrhea, abdominal pain, symptoms of UTI or unilateral weakness. Per report, patient blood pressure was 220/120, which improved to 159/118 after treated with nitroglycerin.   ED Course: pt was found to have troponin 0.013, positive UDS for THC, WBC 5.2, potassium 3.0, creatinine normal, temperature normal, bradycardia, oxygen saturation 99% on room air, chest x-ray has no infiltration. Pt is placed on telemetry bed for observation. Cardiology, Dr. Daphine DeutscherMartin was consulted by EDP.  Review of Systems:   General: no fevers, chills, no changes in body weight, has fatigue HEENT: no blurry vision, hearing changes or sore throat Respiratory: no dyspnea, coughing, wheezing CV: has chest pain, no palpitations GI: no nausea, vomiting, abdominal pain, diarrhea, constipation GU: no dysuria, burning on urination, increased urinary frequency, hematuria  Ext: no leg edema Neuro: no unilateral weakness, numbness, or tingling, no vision change or hearing loss Skin: no rash, no skin tear. MSK: No muscle spasm, no deformity, no limitation of range of movement in spin Heme: No easy bruising.  Travel history: No recent long distant  travel.  Allergy:  Pt is allergic to carrots.  Past Medical History:  Diagnosis Date  . Hypertension     Past Surgical History:  Procedure Laterality Date  . DENTAL SURGERY      Social History:  reports that he has been smoking Cigarettes.  He has been smoking about 0.50 packs per day. He has never used smokeless tobacco. He reports that he drinks alcohol. He reports that he uses drugs, including Marijuana and Cocaine.  Family History:  Family History  Problem Relation Age of Onset  . Hypertension Mother   . Diabetes Mellitus II Mother      Prior to Admission medications   Medication Sig Start Date End Date Taking? Authorizing Provider  acetaminophen (TYLENOL) 500 MG tablet Take 1,000 mg by mouth every 6 (six) hours as needed for headache.    [provider]  azithromycin (ZITHROMAX) 250 MG tablet Take 1 tablet (250 mg total) by mouth daily. Take first 2 tablets together, then 1 every day until finished. 12/04/15   Dowless, Lelon MastSamantha Tripp, PA-C  calcium carbonate (TUMS - DOSED IN MG ELEMENTAL CALCIUM) 500 MG chewable tablet Chew 2 tablets by mouth daily as needed for indigestion or heartburn.    [provider]  cloNIDine (CATAPRES) 0.1 MG tablet Take 1 tablet (0.1 mg total) by mouth 3 (three) times daily. 10/08/15   Gwyneth SproutPlunkett, [place], MD  diltiazem (DILACOR XR) 240 MG 24 hr capsule Take 1 capsule (240 mg total) by mouth daily. 10/08/15   Gwyneth SproutPlunkett, [place], MD  fluconazole (DIFLUCAN) 150 MG tablet Take 2 tablets (300 mg total) by mouth daily. Take 1 time on  10/15/15 10/08/15   Gwyneth Sprout, MD  ibuprofen (ADVIL,MOTRIN) 800 MG tablet Take 1 tablet (800 mg total) by mouth 3 (three) times daily. 05/26/15   Elson Areas, PA-C  lisinopril-hydrochlorothiazide (ZESTORETIC) 20-12.5 MG tablet Take 1 tablet by mouth daily. 10/08/15   Gwyneth Sprout, MD  Phenylephrine-APAP-Guaifenesin (EQ SINUS CONGESTION & PAIN) 5-325-200 MG TABS Take 2 tablets by mouth daily as  needed (cold symptoms).    [provider]  Selenium Sulfide 2.25 % SHAM Apply 10 mLs topically daily. Apply shampoo to the affected area daily for 1 week. Rinse shampoo off after 10 minutes 10/08/15   Gwyneth Sprout, MD    Physical Exam: Vitals:   04/07/17 0330 04/07/17 0400 04/07/17 0430 04/07/17 0512  BP: (!) 159/118 (!) 155/107 (!) 136/99 (!) 157/105  Pulse: 66 65 72   Resp: 15 15 15    Temp:      TempSrc:      SpO2: 99% 100% 100%   Weight:      Height:       General: Not in acute distress HEENT:       Eyes: PERRL, EOMI, no scleral icterus.       ENT: No discharge from the ears and nose, no pharynx injection, no tonsillar enlargement.        Neck: No JVD, no bruit, no mass felt. Heme: No neck lymph node enlargement. Cardiac: S1/S2, RRR, No murmurs, No gallops or rubs. Respiratory: No rales, wheezing, rhonchi or rubs. GI: Soft, nondistended, nontender, no rebound pain, no organomegaly, BS present. GU: No hematuria Ext: No pitting leg edema bilaterally. 2+DP/PT pulse bilaterally. Musculoskeletal: No joint deformities, No joint redness or warmth, no limitation of ROM in spin. Skin: No rashes.  Neuro: Alert, oriented X3, cranial nerves II-XII grossly intact, moves all extremities normally.  Psych: Patient is not psychotic, no suicidal or hemocidal ideation.  Labs on Admission: I have personally reviewed following labs and imaging studies  CBC:  Recent Labs Lab 04/06/17 2306  WBC 5.2  HGB 16.1  HCT 46.2  MCV 87.8  PLT 245   Basic Metabolic Panel:  Recent Labs Lab 04/06/17 2306  NA 136  K 3.0*  CL 101  CO2 27  GLUCOSE 100*  BUN 8  CREATININE 0.95  CALCIUM 9.8   GFR: Estimated Creatinine Clearance: 123.8 mL/min (by C-G formula based on SCr of 0.95 mg/dL). Liver Function Tests: No results for input(s): AST, ALT, ALKPHOS, BILITOT, PROT, ALBUMIN in the last 168 hours. No results for input(s): LIPASE, AMYLASE in the last 168 hours. No results for  input(s): AMMONIA in the last 168 hours. Coagulation Profile: No results for input(s): INR, PROTIME in the last 168 hours. Cardiac Enzymes: No results for input(s): CKTOTAL, CKMB, CKMBINDEX, TROPONINI in the last 168 hours. BNP (last 3 results) No results for input(s): PROBNP in the last 8760 hours. HbA1C: No results for input(s): HGBA1C in the last 72 hours. CBG: No results for input(s): GLUCAP in the last 168 hours. Lipid Profile: No results for input(s): CHOL, HDL, LDLCALC, TRIG, CHOLHDL, LDLDIRECT in the last 72 hours. Thyroid Function Tests: No results for input(s): TSH, T4TOTAL, FREET4, T3FREE, THYROIDAB in the last 72 hours. Anemia Panel: No results for input(s): VITAMINB12, FOLATE, FERRITIN, TIBC, IRON, RETICCTPCT in the last 72 hours. Urine analysis:    Component Value Date/Time   COLORURINE AMBER (A) 12/06/2013 2053   APPEARANCEUR CLEAR 12/06/2013 2053   LABSPEC 1.029 12/06/2013 2053   PHURINE 6.0 12/06/2013 2053   GLUCOSEU  NEGATIVE 12/06/2013 2053   HGBUR NEGATIVE 12/06/2013 2053   BILIRUBINUR NEGATIVE 12/06/2013 2053   KETONESUR NEGATIVE 12/06/2013 2053   PROTEINUR NEGATIVE 12/06/2013 2053   UROBILINOGEN 1.0 12/06/2013 2053   NITRITE NEGATIVE 12/06/2013 2053   LEUKOCYTESUR NEGATIVE 12/06/2013 2053   Sepsis Labs: @LABRCNTIP (procalcitonin:4,lacticidven:4) )No results found for this or any previous visit (from the past 240 hour(s)).   Radiological Exams on Admission: Dg Chest 2 View  Result Date: 04/06/2017 CLINICAL DATA:  Chest pain tonight. EXAM: CHEST  2 VIEW COMPARISON:  Radiographs 12/04/2015 FINDINGS: Normal heart size and mediastinal contours. Borderline hyperinflation. Possible trace right pleural effusion. No pulmonary edema, focal consolidation or pneumothorax. No acute osseous abnormalities. IMPRESSION: Possible small right pleural effusion.  Lungs otherwise clear. Electronically Signed   By: Rubye Oaks M.D.   On: 04/06/2017 23:56     EKG:  Independently reviewed.  Sinus rhythm, QTC 412, T-wave inversion in inferior leads and V4-V6, anteroseptal infarction pattern, PVC, intermittent bigeminy.   Assessment/Plan Principal Problem:   Hypertensive emergency Active Problems:   HTN (hypertension)   Polysubstance abuse   Tobacco abuse   NSTEMI (non-ST elevated myocardial infarction) (HCC)   Hypokalemia   Hypertensive emergency: Bp 220/120-->159/118. Has chest pain and positive trop 0.13. -Will admit to tele -continue home meds: Clonidine, Cardizem, Prinzide - prn hydralazine IV -targeting to lower bp by 25-30% tonight  Chest pain and NSTEMI : trop 0.13. Patient chest pain improved after blood pressure is better controlled. Most likely due to demand ischemia. Cardiology, Dr. Daphine Deutscher was consulted. Recommended to cycle enzymes and TTE.  - Appreciate Dr. Ermalene Searing consultation - cycle CE q6 x3 and repeat EKG in the am  - prn Nitroglycerin, percoct, and aspirin - start lipitor  40 mg daily - Risk factor stratification: will check FLP and A1C  - 2d echo  Polysubstance abuse and Tobacco abuse: -Did counseling about importance of quitting substance -Nicotine patch  Hypokalemia: K=3.0 on admission. - Repleted - Check Mg level  Asthma: stable -prn albuterol nebs  DVT ppx: SQ Lovenox Code Status: Full code Family Communication: None at bed side.    Disposition Plan:  Anticipate discharge back to previous jail Consults called:  Card, Dr. Daphine Deutscher Admission status: Obs / tele    Date of Service 04/07/2017    Lorretta Harp [facility] Pager [phone]  If 7PM-7AM, please contact night-coverage [email or web address] Password [email or web address] 04/07/2017, 5:20 AM

## 2017-04-07 NOTE — ED Notes (Signed)
Admitting MD at bedside.

## 2017-04-07 NOTE — ED Provider Notes (Signed)
Patient seen/examined in the Emergency Department in conjunction with Midlevel Provider Upstill Patient reports CP earlier while at jail.  None at this time.  He feels improved.  He did have improvement with NTG.  No h/o CAD Exam : awake/alert, no distress, watching TV, no murmurs noted Plan: HEART score 4.  His EKG is abnormal, but compared to prior from 01/06/14 it is essentially unchanged He did reports CP was worse with breathing, which would go against ACS.  He appears PERC negative No hypoxia After discussion, he does not want to be admitted despite his risk factors He will stay in the ED to have repeat troponin performed     Zadie RhineWickline, Aaliyah Gavel, MD 04/07/17 0031

## 2017-04-07 NOTE — ED Notes (Signed)
Dr Bebe ShaggyWickline given a copy of troponin results .13

## 2017-04-07 NOTE — ED Provider Notes (Signed)
[facility]-EMERGENCY DEPT Provider Note   CSN: [id number] Arrival date & time: 04/06/17  2247     History   Chief Complaint Chief Complaint  Patient presents with  . Chest Pain    HPI Stephen West is a 41 y.o. male.  [facility] with [facility] of HTN [facility] with complaint of chest pain that started around 6:00 pm 04/06/17. He described left sternal discomfort that is some worse with breathing. No clear SOB, nausea. No cough or fever. He was given a NTG at 6:30 with some relief of the pain but it returned later. No history of CAD, no family history. He currently complain of headache only.   The history is provided by the patient. No language interpreter was used.  Chest Pain   Associated symptoms include diaphoresis. Pertinent negatives include no abdominal pain, no fever, no nausea and no weakness.    Past Medical History:  Diagnosis Date  . Hypertension     Patient Active Problem List   Diagnosis Date Noted  . Hip pain 01/12/2014  . Sinus congestion 01/12/2014  . Asthma 01/06/2014  . HTN (hypertension) 12/30/2013  . Borderline hyperglycemia 12/30/2013  . Chronic generalized abdominal pain 12/30/2013  . Headache 12/30/2013  . Urinary frequency 12/30/2013  . Non-suicidal depressed mood 12/30/2013  . Tobacco abuse counseling 12/30/2013  . Screening for hyperlipidemia 12/30/2013    History reviewed. No pertinent surgical history.     Home Medications    Prior to Admission medications   Medication Sig Start Date End Date Taking? Authorizing Provider  acetaminophen (TYLENOL) 500 MG tablet Take 1,000 mg by mouth every 6 (six) hours as needed for headache.    [provider]  azithromycin (ZITHROMAX) 250 MG tablet Take 1 tablet (250 mg total) by mouth daily. Take first 2 tablets together, then 1 every day until finished. 12/04/15   Dowless, Lelon MastSamantha Tripp, PA-C  calcium carbonate (TUMS - DOSED IN MG ELEMENTAL CALCIUM) 500 MG chewable tablet  Chew 2 tablets by mouth daily as needed for indigestion or heartburn.    [provider]  cloNIDine (CATAPRES) 0.1 MG tablet Take 1 tablet (0.1 mg total) by mouth 3 (three) times daily. 10/08/15   Gwyneth SproutPlunkett, Behler, MD  diltiazem (DILACOR XR) 240 MG 24 hr capsule Take 1 capsule (240 mg total) by mouth daily. 10/08/15   Gwyneth SproutPlunkett, [place], MD  fluconazole (DIFLUCAN) 150 MG tablet Take 2 tablets (300 mg total) by mouth daily. Take 1 time on 10/15/15 10/08/15   Gwyneth SproutPlunkett, Artley, MD  ibuprofen (ADVIL,MOTRIN) 800 MG tablet Take 1 tablet (800 mg total) by mouth 3 (three) times daily. 05/26/15   Elson AreasSofia, Leslie K, PA-C  lisinopril-hydrochlorothiazide (ZESTORETIC) 20-12.5 MG tablet Take 1 tablet by mouth daily. 10/08/15   Gwyneth SproutPlunkett, Mannis, MD  Phenylephrine-APAP-Guaifenesin (EQ SINUS CONGESTION & PAIN) 5-325-200 MG TABS Take 2 tablets by mouth daily as needed (cold symptoms).    [provider]  Selenium Sulfide 2.25 % SHAM Apply 10 mLs topically daily. Apply shampoo to the affected area daily for 1 week. Rinse shampoo off after 10 minutes 10/08/15   Gwyneth SproutPlunkett, Fader, MD    Family History No family history on file.  Social History Social History  Substance Use Topics  . Smoking status: Current Every Day Smoker    Packs/day: 0.50    Types: Cigarettes  . Smokeless tobacco: Never Used  . Alcohol use Yes     Comment: seldom     Allergies   Patient has no  known allergies.   Review of Systems Review of Systems  Constitutional: Positive for diaphoresis. Negative for chills and fever.  HENT: Negative.   Respiratory:       See HPI.  Cardiovascular: Positive for chest pain. Negative for leg swelling.  Gastrointestinal: Negative.  Negative for abdominal pain and nausea.  Musculoskeletal: Negative.   Neurological: Negative.  Negative for weakness and light-headedness.     Physical Exam Updated Vital Signs BP (!) 156/112   Pulse 68   Temp 97.9 F (36.6 C) (Oral)   Resp 16    Ht 6' (1.829 m)   Wt 95.3 kg (210 lb)   SpO2 99%   BMI 28.48 kg/m   Physical Exam  Constitutional: He is oriented to person, place, and time. He appears well-developed and well-nourished.  HENT:  Head: Normocephalic.  Neck: Normal range of motion. Neck supple.  Cardiovascular: Normal rate and regular rhythm.   No murmur heard. Pulmonary/Chest: Effort normal. He has wheezes. He has rales. He exhibits tenderness.  Abdominal: Soft. Bowel sounds are normal. There is no tenderness. There is no rebound and no guarding.  Musculoskeletal: Normal range of motion. He exhibits no edema.  Neurological: He is alert and oriented to person, place, and time.  Skin: Skin is warm and dry. No rash noted.  Psychiatric: He has a normal mood and affect.  Nursing note and vitals reviewed.    ED Treatments / Results  Labs (all labs ordered are listed, but only abnormal results are displayed) Labs Reviewed  BASIC METABOLIC PANEL - Abnormal; Notable for the following:       Result Value   Potassium 3.0 (*)    Glucose, Bld 100 (*)    All other components within normal limits  CBC  I-STAT TROPOININ, ED    EKG  EKG Interpretation  Date/Time:  Sunday April 06 2017 22:53:54 EDT Ventricular Rate:  69 PR Interval:    QRS Duration: 108 QT Interval:  384 QTC Calculation: 412 R Axis:   87 Text Interpretation:  Unknown rhythm, irregular rate LVH with secondary repolarization abnormality Anterior Q waves, possibly due to LVH Minimal ST elevation, lateral leads Confirmed by ZAMMIT  MD, JOSEPH [phone]) on 04/06/2017 11:00:23 PM       Radiology Dg Chest 2 View  Result Date: 04/06/2017 CLINICAL DATA:  Chest pain tonight. EXAM: CHEST  2 VIEW COMPARISON:  Radiographs 12/04/2015 FINDINGS: Normal heart size and mediastinal contours. Borderline hyperinflation. Possible trace right pleural effusion. No pulmonary edema, focal consolidation or pneumothorax. No acute osseous abnormalities. IMPRESSION: Possible  small right pleural effusion.  Lungs otherwise clear. Electronically Signed   By: Rubye Oaks M.D.   On: 04/06/2017 23:56    Procedures Procedures (including critical care time)  Medications Ordered in ED Medications - No data to display   Initial Impression / Assessment and Plan / ED Course  I have reviewed the triage vital signs and the nursing notes.  Pertinent labs & imaging results that were available during my care of the patient were reviewed by me and considered in my medical decision making (see chart for details).     Patient here with CP that started around 6:00 yesterday. He reports left sternal pain that was improved with NTG. ASA given. No history CAD.   The patient has reproducible chest pain. EKG does not show ischemic changes. Troponin x 1 negative. He does have a heart score of 4 given risk factors.  Given concerning symptoms, will obtain a delta troponin.  He has been seen and examined by Dr. Bebe Shaggy. Discussed risk factors and concerning symptoms with recommendation for admission. He reports he does not want to be admitted. He is willing to wait for a second troponin level.   Delta troponin elevated at 0.13 indicating NSTEMI. Patient agrees to be admitted.   Final Clinical Impressions(s) / ED Diagnoses   Final diagnoses:  None   1. NSTEMI  New Prescriptions New Prescriptions   No medications on file     Danne Harbor 04/17/17 0217    Zadie Rhine, MD 04/17/17 [phone]

## 2017-04-07 NOTE — Consult Note (Signed)
History and Physical   Patient ID: Stephen Salinesaron D Gilbo, MRN: [id number], DOB: 02-Mar-1976   Date of Encounter: 04/07/2017, 3:43 AM  Primary Care Provider: Patient, No Pcp Per Cardiologist: Nahser Electrophysiologist:  NA  Chief Complaint:  Abnormal trop 0.13  History of Present Illness: Stephen West is a 41 y.o. male w/ h/o HTN who [facility] with c/o CP. On arrival, his BP was elevated, as high as 157/117. He has no prior h/o CAD but has seen cardiology for management of his HTN. ED calls us for admission due to the troponin abnormality. Pt states that he wanted to come in due to CP; he says he has felt bad all day today, with HA, and has been laying around in bed. Later this evening he also started noticing a sharp CP in the center of his chest. The pain was like a "throbbing" pain and was worse with deep breathing. Pain lasted several hours. Again, onset at rest. No associated diaphoresis, SOB, palpitations or any other significant associated sx. Pt went to the nurse at the jail and was told his BP was high. He was given NTG SL which did ease off the pain for a time, but it came back with recurrent HA about an hour later and EMS was called to bring him to the ED. Trop was abnormal at 0.13 as above with BP as aforementioned. BP was still in 150s/100s at the time of my exam.  Past Medical History:  Diagnosis Date  . Hypertension     History reviewed. No pertinent surgical history.   Prior to Admission medications   Medication Sig Start Date End Date Taking? Authorizing Provider  acetaminophen (TYLENOL) 500 MG tablet Take 1,000 mg by mouth every 6 (six) hours as needed for headache.    [provider]  azithromycin (ZITHROMAX) 250 MG tablet Take 1 tablet (250 mg total) by mouth daily. Take first 2 tablets together, then 1 every day until finished. 12/04/15   Dowless, Lelon MastSamantha Tripp, PA-C  calcium carbonate (TUMS - DOSED IN MG ELEMENTAL CALCIUM) 500 MG chewable tablet  Chew 2 tablets by mouth daily as needed for indigestion or heartburn.    [provider]  cloNIDine (CATAPRES) 0.1 MG tablet Take 1 tablet (0.1 mg total) by mouth 3 (three) times daily. 10/08/15   Gwyneth SproutPlunkett, Pooler, MD  diltiazem (DILACOR XR) 240 MG 24 hr capsule Take 1 capsule (240 mg total) by mouth daily. 10/08/15   Gwyneth SproutPlunkett, Alviar, MD  fluconazole (DIFLUCAN) 150 MG tablet Take 2 tablets (300 mg total) by mouth daily. Take 1 time on 10/15/15 10/08/15   Gwyneth SproutPlunkett, Shingledecker, MD  ibuprofen (ADVIL,MOTRIN) 800 MG tablet Take 1 tablet (800 mg total) by mouth 3 (three) times daily. 05/26/15   Elson AreasSofia, Leslie K, PA-C  lisinopril-hydrochlorothiazide (ZESTORETIC) 20-12.5 MG tablet Take 1 tablet by mouth daily. 10/08/15   Gwyneth SproutPlunkett, Zachery, MD  Phenylephrine-APAP-Guaifenesin (EQ SINUS CONGESTION & PAIN) 5-325-200 MG TABS Take 2 tablets by mouth daily as needed (cold symptoms).    [provider]  Selenium Sulfide 2.25 % SHAM Apply 10 mLs topically daily. Apply shampoo to the affected area daily for 1 week. Rinse shampoo off after 10 minutes 10/08/15   Gwyneth SproutPlunkett, Topper, MD     Allergies: No Known Allergies  Social History:  The patient  reports that he has been smoking Cigarettes.  He has been smoking about 0.50 packs per day. He has never used smokeless tobacco. He reports that he drinks alcohol. He reports  that he uses drugs, including Marijuana and Cocaine.   Family History:  The patient's family history is not on file.   ROS:  Please see the history of present illness.     All other systems reviewed and negative.   Vital Signs: Blood pressure (!) 156/115, pulse 73, temperature 97.9 F (36.6 C), temperature source Oral, resp. rate 13, height 6' (1.829 m), weight 95.3 kg (210 lb), SpO2 100 %.  PHYSICAL EXAM: General:  Well nourished, well developed, in no acute distress HEENT: normal Lymph: no adenopathy Neck: no JVD Endocrine:  No thryomegaly Vascular: No carotid bruits; DP  pulses 2+ bilaterally  Cardiac:  normal S1, S2; RRR; no murmur  Lungs:  clear to auscultation bilaterally, no wheezing, rhonchi or rales  Abd: soft, nontender, no hepatomegaly  Ext: no edema Musculoskeletal:  No deformities, BUE and BLE strength normal and equal Skin: warm and dry  Neuro:  CNs 2-12 intact, no focal abnormalities noted Psych:  Normal affect   EKG:  NSR with PACs; LVH with associated ST changes  Labs:   Lab Results  Component Value Date   WBC 5.2 04/06/2017   HGB 16.1 04/06/2017   HCT 46.2 04/06/2017   MCV 87.8 04/06/2017   PLT 245 04/06/2017    Recent Labs Lab 04/06/17 2306  NA 136  K 3.0*  CL 101  CO2 27  BUN 8  CREATININE 0.95  CALCIUM 9.8  GLUCOSE 100*   No results for input(s): CKTOTAL, CKMB, TROPONINI in the last 72 hours. Troponin (Point of Care Test)  Recent Labs  04/07/17 0231  TROPIPOC 0.13*    Lab Results  Component Value Date   CHOL 143 12/30/2013   HDL 70 12/30/2013   LDLCALC 61 12/30/2013   TRIG 62 12/30/2013   No results found for: DDIMER  Radiology/Studies:  Dg Chest 2 View  Result Date: 04/06/2017 CLINICAL DATA:  Chest pain tonight. EXAM: CHEST  2 VIEW COMPARISON:  Radiographs 12/04/2015 FINDINGS: Normal heart size and mediastinal contours. Borderline hyperinflation. Possible trace right pleural effusion. No pulmonary edema, focal consolidation or pneumothorax. No acute osseous abnormalities. IMPRESSION: Possible small right pleural effusion.  Lungs otherwise clear. Electronically Signed   By: Rubye Oaks M.D.   On: 04/06/2017 23:56      ASSESSMENT AND PLAN:   1. HTN urgency with (minimally) elevated troponin: pt's CP is atypical and his BP is very high. His current sx may be from uncontrolled HTN with demand ischemia. Needs better BP control; BP still in the 150s/100s when I went to the ED to see the patient so it seems little has been done down there to get the BP under control. Would cont to cycle enzymes. No acute  EKG changes. Would get TTE. If trop cont to rise consider LHC, but I think this may be type II NSTEMI due to demand.  2. Hypokalemia: elevated BP w/ hypokalemia may need further w/u for secondary cause of HTN (?poss hyperaldosteronism?). Will defer to medicine for that  Thank you for the opportunity to participate in the care of this patient. Will follow. Please call w/ questions.   Precious Reel, MD , [facility] 04/07/2017 3:43 AM

## 2017-04-07 NOTE — Progress Notes (Signed)
Stephen LindenAaron D XXXWhitney to be D[facility]C'd back [facility] per MD order. Discussed with the patient and all questions fully answered.    VVS, Skin clean, dry and intact without evidence of skin break down, no evidence of skin tears noted.  IV catheter discontinued intact. Site without signs and symptoms of complications. Dressing and pressure applied.  An After Visit Summary was printed and given to the patient.  Patient escorted via WC.  [name], [name]  04/07/2017 6:54 PM

## 2017-04-08 LAB — HEMOGLOBIN A1C
Hgb A1c MFr Bld: 5.3 % (ref 4.8–5.6)
Mean Plasma Glucose: 105 mg/dL

## 2017-04-26 ENCOUNTER — Encounter (HOSPITAL_COMMUNITY): Payer: Self-pay | Admitting: Emergency Medicine

## 2017-04-26 ENCOUNTER — Emergency Department (HOSPITAL_COMMUNITY)
Admission: EM | Admit: 2017-04-26 | Discharge: 2017-04-26 | Discharge status: Against Medical Advice | Payer: 59 | Attending: Emergency Medicine | Admitting: Emergency Medicine

## 2017-04-26 DIAGNOSIS — Z8614 Personal history of Methicillin resistant Staphylococcus aureus infection: Secondary | ICD-10-CM | POA: Insufficient documentation

## 2017-04-26 DIAGNOSIS — J45909 Unspecified asthma, uncomplicated: Secondary | ICD-10-CM | POA: Insufficient documentation

## 2017-04-26 DIAGNOSIS — Z79899 Other long term (current) drug therapy: Secondary | ICD-10-CM | POA: Insufficient documentation

## 2017-04-26 DIAGNOSIS — I160 Hypertensive urgency: Secondary | ICD-10-CM | POA: Insufficient documentation

## 2017-04-26 DIAGNOSIS — F1410 Cocaine abuse, uncomplicated: Secondary | ICD-10-CM | POA: Insufficient documentation

## 2017-04-26 DIAGNOSIS — J340 Abscess, furuncle and carbuncle of nose: Secondary | ICD-10-CM

## 2017-04-26 DIAGNOSIS — F17210 Nicotine dependence, cigarettes, uncomplicated: Secondary | ICD-10-CM | POA: Insufficient documentation

## 2017-04-26 HISTORY — DX: Non-ST elevation (NSTEMI) myocardial infarction: I21.4

## 2017-04-26 MED ORDER — LISINOPRIL 20 MG PO TABS
40.0000 mg | ORAL_TABLET | Freq: Every day | ORAL | Status: DC
  Administered 2017-04-26: 40 mg via ORAL
  Filled 2017-04-26: qty 2

## 2017-04-26 MED ORDER — CLONIDINE HCL 0.1 MG PO TABS
0.1000 mg | ORAL_TABLET | Freq: Three times a day (TID) | ORAL | Status: DC
  Administered 2017-04-26: 0.1 mg via ORAL
  Filled 2017-04-26: qty 1

## 2017-04-26 MED ORDER — DOXYCYCLINE HYCLATE 100 MG PO CAPS: 100.0000 mg | ORAL_CAPSULE | Freq: Two times a day (BID) | ORAL | 0 refills | Status: DC

## 2017-04-26 MED ORDER — DILTIAZEM HCL ER COATED BEADS 240 MG PO CP24
240.0000 mg | ORAL_CAPSULE | Freq: Every day | ORAL | Status: DC
  Administered 2017-04-26: 240 mg via ORAL
  Filled 2017-04-26: qty 1

## 2017-04-26 MED ORDER — HYDROCHLOROTHIAZIDE 25 MG PO TABS
25.0000 mg | ORAL_TABLET | Freq: Every day | ORAL | Status: DC
  Administered 2017-04-26: 25 mg via ORAL
  Filled 2017-04-26: qty 1

## 2017-04-26 NOTE — Discharge Instructions (Signed)
Take the antibiotics and blood pressure medications as prescribed. Stop using cocaine. You are leaving against medical advice because you declined blood work to assess for damage to your heart. Return to the ED if you wish to be reevaluated.

## 2017-04-26 NOTE — ED Notes (Signed)
Patient declined bloodwork, Dr Manus Gunningancour notified.  Patient discharged AMA.

## 2017-04-26 NOTE — ED Notes (Signed)
Pt is refusing lab draw, states that the doctor already fixed what he came in for. RN notified

## 2017-04-26 NOTE — ED Triage Notes (Signed)
Pt reports being admitted a few weeks ago, when he went upstairs they swabbed him for MRSA, ever since his nose has been sore. Pt made acuity 3 d/t vital signs.

## 2017-04-26 NOTE — ED Provider Notes (Signed)
[facility]-EMERGENCY DEPT Provider Note   CSN: [id number] Arrival date & time: 04/26/17  0025   By signing my name below, I, Stephen West, attest that this documentation has been prepared under the direction and in the presence of Stephen West, Stephen SeniorStephen, MD. Electronically signed, Stephen West, ED Scribe. 04/26/17. 1:16 AM.   History   Chief Complaint Chief Complaint  Patient presents with  . Sore Nose   The history is provided by the patient and medical records. No language interpreter was used.    Stephen West is a 41 y.o. male with h/o substance abuse BIB EMS to the Emergency Department concerning a gradually worsening bump in the R side of the nose since 3 days ago. Associated nosebleeds, facial swelling. Pt states he has drained the bump with tweezers, though this has exacerbated his constant 9/10 pain. Pt diagnosed with MRSA to the currently affected area "about two weeks ago" (04/06/2017) when [facility] Pt admitted at that time for NSTEMI MI. H/o HTN noted; pt states he is taking prescribed medications to control this at home as advised, though he did not take his medications within the past 24 hours. Pt states used cocaine within the past 24 hours; he reports daily use. No h/o DM. No fever, vomiting, chest pain, SOB, difficulty breathing or swallowing, headaches. No other complaints at this time.   Past Medical History:  Diagnosis Date  . Hypertension   . NSTEMI (non-ST elevated myocardial infarction) [facility](HCC)     Patient Active Problem List   Diagnosis Date Noted  . Polysubstance abuse 04/07/2017  . Tobacco abuse 04/07/2017  . NSTEMI (non-ST elevated myocardial infarction) (HCC) 04/07/2017  . Hypertensive emergency 04/07/2017  . Hypokalemia 04/07/2017  . Hip pain 01/12/2014  . Sinus congestion 01/12/2014  . Asthma 01/06/2014  . HTN (hypertension) 12/30/2013  . Borderline hyperglycemia 12/30/2013  . Chronic generalized abdominal pain 12/30/2013  .  Headache 12/30/2013  . Urinary frequency 12/30/2013  . Non-suicidal depressed mood 12/30/2013  . Tobacco abuse counseling 12/30/2013  . Screening for hyperlipidemia 12/30/2013    Past Surgical History:  Procedure Laterality Date  . DENTAL SURGERY         Home Medications    Prior to Admission medications   Medication Sig Start Date End Date Taking? Authorizing Provider  atorvastatin (LIPITOR) 40 MG tablet Take 1 tablet (40 mg total) by mouth daily at 6 PM. 04/07/17   Joseph ArtVann, Jessica U, DO  cloNIDine (CATAPRES) 0.1 MG tablet Take 1 tablet (0.1 mg total) by mouth 3 (three) times daily. Patient not taking: Reported on 04/07/2017 10/08/15   Gwyneth SproutPlunkett, Troiano, MD  diltiazem (DILACOR XR) 240 MG 24 hr capsule Take 1 capsule (240 mg total) by mouth daily. Patient not taking: Reported on 04/07/2017 10/08/15   Gwyneth SproutPlunkett, Bolls, MD  doxycycline (VIBRAMYCIN) 100 MG capsule Take 1 capsule (100 mg total) by mouth 2 (two) times daily. 04/26/17   Manan Olmo, Stephen SeniorStephen, MD  hydrochlorothiazide (HYDRODIURIL) 25 MG tablet Take 1 tablet (25 mg total) by mouth daily. 04/08/17   Joseph ArtVann, Jessica U, DO  lisinopril (PRINIVIL,ZESTRIL) 40 MG tablet Take 1 tablet (40 mg total) by mouth daily. 04/08/17   Joseph ArtVann, Jessica U, DO    Family History Family History  Problem Relation Age of Onset  . Hypertension Mother   . Diabetes Mellitus II Mother     Social History Social History  Substance Use Topics  . Smoking status: Current Every Day Smoker  Packs/day: 0.50    Types: Cigarettes  . Smokeless tobacco: Never Used  . Alcohol use Yes     Comment: seldom     Allergies   Banana and Carrot [daucus carota]   Review of Systems Review of Systems All other systems reviewed and all systems are negative for acute changes except as noted in the HPI and PMH.    Physical Exam Updated Vital Signs BP (!) 190/136 (BP Location: Left Arm)   Pulse (!) 120   Temp 98.3 F (36.8 C) (Oral)   Resp 18   Ht 6' (1.829 m)   Wt  205 lb (93 kg)   SpO2 98%   BMI 27.80 kg/m   Physical Exam  Constitutional: He is oriented to person, place, and time. He appears well-developed and well-nourished. No distress.  HENT:  Head: Normocephalic and atraumatic.  Mouth/Throat: Oropharynx is clear and moist. No oropharyngeal exudate.  Friable septum bilaterally. No active bleeding. Small pustule to R nare on the ala.  Eyes: Conjunctivae and EOM are normal. Pupils are equal, round, and reactive to light.  Neck: Normal range of motion. Neck supple.  No meningismus.  Cardiovascular: Normal rate, regular rhythm, normal heart sounds and intact distal pulses.   No murmur heard. Pulmonary/Chest: Effort normal and breath sounds normal. No respiratory distress.  Abdominal: Soft. There is no tenderness. There is no rebound and no guarding.  Musculoskeletal: Normal range of motion. He exhibits no edema or tenderness.  Neurological: He is alert and oriented to person, place, and time. No cranial nerve deficit. He exhibits normal muscle tone. Coordination normal.   5/5 strength throughout. CN 2-12 intact.Equal grip strength.   Skin: Skin is warm.  Psychiatric: He has a normal mood and affect. His behavior is normal.  Nursing note and vitals reviewed.    ED Treatments / Results  DIAGNOSTIC STUDIES: Oxygen Saturation is 98% on RA, NL by my interpretation.    COORDINATION OF CARE: 1:04 AM-Discussed next steps with pt. Pt verbalized understanding and is agreeable with the plan. Pt prepared for I&D.   Labs (all labs ordered are listed, but only abnormal results are displayed) Labs Reviewed  CBC WITH DIFFERENTIAL/PLATELET  BASIC METABOLIC PANEL  TROPONIN I  RAPID URINE DRUG SCREEN, HOSP PERFORMED    EKG  EKG Interpretation  Date/Time:  Saturday April 26 2017 01:19:37 EDT Ventricular Rate:  99 PR Interval:    QRS Duration: 100 QT Interval:  351 QTC Calculation: 451 R Axis:   83 Text Interpretation:  Sinus rhythm Left  ventricular hypertrophy Probable anterior infarct, age indeterminate Lateral leads are also involved Left ventricular hypertrophy with strain pattern Confirmed by Glynn Octave [phone]) on 04/26/2017 2:01:06 AM       Radiology No results found.  Procedures .Marland KitchenIncision and Drainage Date/Time: 04/26/2017 1:35 AM Performed by: Glynn Octave Authorized by: Glynn Octave   Consent:    Consent obtained:  Verbal   Consent given by:  Patient   Risks discussed:  Bleeding and incomplete drainage Location:    Location:  Head   Head location:  Septum Anesthesia (see MAR for exact dosages):    Anesthesia method:  None Procedure type:    Complexity:  Simple Procedure details:    Needle aspiration: no     Incision types:  Stab incision   Incision depth:  Dermal   Scalpel blade:  11   Drainage:  Purulent   Drainage amount:  Moderate   Wound treatment:  Wound left open  Packing materials:  None Post-procedure details:    Patient tolerance of procedure:  Tolerated well, no immediate complications   (including critical care time)  Medications Ordered in ED Medications  cloNIDine (CATAPRES) tablet 0.1 mg (0.1 mg Oral Given 04/26/17 0119)  diltiazem (CARDIZEM CD) 24 hr capsule 240 mg (240 mg Oral Given 04/26/17 0153)  hydrochlorothiazide (HYDRODIURIL) tablet 25 mg (25 mg Oral Given 04/26/17 0119)  lisinopril (PRINIVIL,ZESTRIL) tablet 40 mg (40 mg Oral Given 04/26/17 0119)     Initial Impression / Assessment and Plan / ED Course  I have reviewed the triage vital signs and the nursing notes.  Pertinent labs & imaging results that were available during my care of the patient were reviewed by me and considered in my medical decision making (see chart for details).     Patient complains of soreness to his nose for the past several days. He believes this is due to the swab that he obtained in the hospital. States he's been taking his blood pressure medications up until today. Admits to  abusing cocaine daily basis. No headache, chest pain or shortness of breath.  Patient does have access to nose which was drained at bedside.  He is hypertensive and tachycardic which is likely secondary to his cocaine use but he is also not had his medications today.  EKG shows LVH with strain pattern and J-point elevation in V3. Discussed with Dr. Tiburcio Pea of cardiology who reviewed EKG and agrees this is likely due to LVH and cocaine use. Not consistent with STEMI.  Attempted to obtain blood work to evaluate for end organ damage given patient's hypertension. He refuses blood work. He states his nose is better and that's what he came in for. Discussed with patient that there is left heart strain and LVH which could be due to hypertensive emergency and cocaine abuse. He may have damage to his heart which could potentially be life-threatening.  Patient continues to refuse blood work. He appears to have capacity to make medical decisions and will leave AGAINST MEDICAL ADVICE.  Patient requesting to leave against medical advice. He is alert and oriented x3 and appears to have decision making capacity. Denies suicidal or homicidal ideation.  Discussed that admission and further testing is recommended and emergent medical conditions have not been ruled out. Discussed that leaving against medical advice may result in clinical deterioration and possible death.  Final Clinical Impressions(s) / ED Diagnoses   Final diagnoses:  Nasal abscess  Cocaine abuse  Hypertensive urgency    New Prescriptions New Prescriptions   DOXYCYCLINE (VIBRAMYCIN) 100 MG CAPSULE    Take 1 capsule (100 mg total) by mouth 2 (two) times daily.  I personally performed the services described in this documentation, which was scribed in my presence. The recorded information has been reviewed and is accurate.    Glynn Octave, MD 04/26/17 [phone]

## 2017-05-08 ENCOUNTER — Ambulatory Visit (INDEPENDENT_AMBULATORY_CARE_PROVIDER_SITE_OTHER): Payer: Self-pay | Admitting: Physician Assistant

## 2017-05-08 ENCOUNTER — Encounter (INDEPENDENT_AMBULATORY_CARE_PROVIDER_SITE_OTHER): Payer: Self-pay | Admitting: Physician Assistant

## 2017-05-08 VITALS — BP 148/98 | HR 86 | Temp 97.7°F | Ht 72.5 in | Wt 214.8 lb

## 2017-05-08 DIAGNOSIS — I214 Non-ST elevation (NSTEMI) myocardial infarction: Secondary | ICD-10-CM

## 2017-05-08 DIAGNOSIS — F1490 Cocaine use, unspecified, uncomplicated: Secondary | ICD-10-CM

## 2017-05-08 DIAGNOSIS — I10 Essential (primary) hypertension: Secondary | ICD-10-CM

## 2017-05-08 MED ORDER — LISINOPRIL 40 MG PO TABS: 40.0000 mg | ORAL_TABLET | Freq: Every day | ORAL | 1 refills | Status: DC

## 2017-05-08 MED ORDER — CLONIDINE HCL 0.1 MG PO TABS: 0.1000 mg | ORAL_TABLET | Freq: Two times a day (BID) | ORAL | 1 refills | Status: DC

## 2017-05-08 MED ORDER — HYDROCHLOROTHIAZIDE 25 MG PO TABS: 25.0000 mg | ORAL_TABLET | Freq: Every day | ORAL | 1 refills | Status: DC

## 2017-05-08 MED ORDER — NITROGLYCERIN 0.4 MG SL SUBL: 0.4000 mg | SUBLINGUAL_TABLET | SUBLINGUAL | 1 refills | Status: DC | PRN

## 2017-05-08 MED ORDER — ATORVASTATIN CALCIUM 40 MG PO TABS: 40.0000 mg | ORAL_TABLET | Freq: Every day | ORAL | 1 refills | Status: DC

## 2017-05-08 MED ORDER — DILTIAZEM HCL ER 240 MG PO CP24: 240.0000 mg | ORAL_CAPSULE | Freq: Every day | ORAL | 1 refills | Status: DC

## 2017-05-08 NOTE — Progress Notes (Signed)
Subjective:  Patient ID: Stephen Salinesaron D Mcculloh, male    DOB: Jul 27, 1976  Age: 41 y.o. MRN: [id number]  CC: hospital f/u  HPI Stephen West is a 41 y.o. male with a PMH of HTN presents on f/u of ED visit on 04/06/17. Pt suffered NSTEMI on 04/06/17, no PCI. Returned to to ED on 04/26/17 for nasal abscess, told ED provider he had been using cocaine the day prior. Was prescribed 4 antihypertensives but he was unable to afford. Says he is feeling well now. Denies CP, palpitations, SOB, HA, diaphoresis, back pain, arm pain, abdominal pain, f/c/n/v, rash, GI/GU sxs.   Outpatient Medications Prior to Visit  Medication Sig Dispense Refill  . atorvastatin (LIPITOR) 40 MG tablet Take 1 tablet (40 mg total) by mouth daily at 6 PM. (Patient not taking: Reported on 05/08/2017) 30 tablet 0  . cloNIDine (CATAPRES) 0.1 MG tablet Take 1 tablet (0.1 mg total) by mouth 3 (three) times daily. (Patient not taking: Reported on 04/07/2017) 90 tablet 0  . diltiazem (DILACOR XR) 240 MG 24 hr capsule Take 1 capsule (240 mg total) by mouth daily. (Patient not taking: Reported on 04/07/2017) 30 capsule 1  . doxycycline (VIBRAMYCIN) 100 MG capsule Take 1 capsule (100 mg total) by mouth 2 (two) times daily. 20 capsule 0  . hydrochlorothiazide (HYDRODIURIL) 25 MG tablet Take 1 tablet (25 mg total) by mouth daily. (Patient not taking: Reported on 05/08/2017) 30 tablet 0  . lisinopril (PRINIVIL,ZESTRIL) 40 MG tablet Take 1 tablet (40 mg total) by mouth daily. (Patient not taking: Reported on 05/08/2017) 30 tablet 0   No facility-administered medications prior to visit.      ROS Review of Systems  Constitutional: Negative for chills, fever and malaise/fatigue.  Eyes: Negative for blurred vision.  Respiratory: Negative for shortness of breath.   Cardiovascular: Negative for chest pain and palpitations.  Gastrointestinal: Negative for abdominal pain and nausea.  Genitourinary: Negative for dysuria and hematuria.  Musculoskeletal:  Negative for joint pain and myalgias.  Skin: Negative for rash.  Neurological: Negative for tingling and headaches.  Psychiatric/Behavioral: Negative for depression. The patient is not nervous/anxious.     Objective:  BP (!) 163/128 (BP Location: Right Arm, Patient Position: Sitting, Cuff Size: Large)   Pulse 86   Temp 97.7 F (36.5 C) (Oral)   Ht 6' 0.5" (1.842 m)   Wt 214 lb 12.8 oz (97.4 kg)   SpO2 99%   BMI 28.73 kg/m   BP/Weight 05/08/2017 04/26/2017 04/07/2017  Systolic BP 163 166 113  Diastolic BP 128 116 64  Wt. (Lbs) 214.8 205 209.44  BMI 28.73 27.8 28.4      Physical Exam  Constitutional: He is oriented to person, place, and time.  Well developed, well nourished, NAD, polite  HENT:  Head: Normocephalic and atraumatic.  Eyes: No scleral icterus.  Neck: Normal range of motion. Neck supple. No thyromegaly present.  Cardiovascular: Normal rate, regular rhythm and normal heart sounds.   Pulmonary/Chest: Effort normal and breath sounds normal.  Abdominal: Soft. Bowel sounds are normal. There is no tenderness.  Musculoskeletal: He exhibits no edema.  Neurological: He is alert and oriented to person, place, and time.  Skin: Skin is warm and dry. No rash noted. No erythema. No pallor.  Psychiatric: He has a normal mood and affect. His behavior is normal. Thought content normal.  Vitals reviewed.    Assessment & Plan:   1. NSTEMI (non-ST elevated myocardial infarction) (HCC) - having a healthy, nutritious, and  balanced diet, including fruits, vegetables, healthy fats, and whole grains -reducing and limiting foods that are high in saturated and trans fats  -exercising regularly, recommended as at least 30 minutes five times a week -managing stress levels -quitting a smoking habit  -remaining at a healthy weight   2. Essential hypertension - Begin atorvastatin (LIPITOR) 40 MG tablet; Take 1 tablet (40 mg total) by mouth daily at 6 PM.  Dispense: 90 tablet; Refill:  1 - Begin cloNIDine (CATAPRES) 0.1 MG tablet; Take 1 tablet (0.1 mg total) by mouth 2 (two) times daily.  Dispense: 180 tablet; Refill: 1 - Begin diltiazem (DILACOR XR) 240 MG 24 hr capsule; Take 1 capsule (240 mg total) by mouth daily.  Dispense: 90 capsule; Refill: 1 - Begin hydrochlorothiazide (HYDRODIURIL) 25 MG tablet; Take 1 tablet (25 mg total) by mouth daily.  Dispense: 90 tablet; Refill: 1 - Begin lisinopril (PRINIVIL,ZESTRIL) 40 MG tablet; Take 1 tablet (40 mg total) by mouth daily.  Dispense: 90 tablet; Refill: 1 - Begin nitroGLYCERIN (NITROSTAT) 0.4 MG SL tablet; Place 1 tablet (0.4 mg total) under the tongue every 5 (five) minutes as needed for chest pain.  Dispense: 45 tablet; Refill: 1  3. Cocaine use - Patient has been counseled on the effects of cocaine on the heart. He is aware and states he will try to abstain from cocaine use.    Meds ordered this encounter  Medications  . atorvastatin (LIPITOR) 40 MG tablet    Sig: Take 1 tablet (40 mg total) by mouth daily at 6 PM.    Dispense:  90 tablet    Refill:  1    Order Specific Question:   Supervising Provider    Answer:   Quentin Angst [id number]  . cloNIDine (CATAPRES) 0.1 MG tablet    Sig: Take 1 tablet (0.1 mg total) by mouth 2 (two) times daily.    Dispense:  180 tablet    Refill:  1    Order Specific Question:   Supervising Provider    Answer:   Quentin Angst [id number]  . diltiazem (DILACOR XR) 240 MG 24 hr capsule    Sig: Take 1 capsule (240 mg total) by mouth daily.    Dispense:  90 capsule    Refill:  1    Order Specific Question:   Supervising Provider    Answer:   Quentin Angst [id number]  . hydrochlorothiazide (HYDRODIURIL) 25 MG tablet    Sig: Take 1 tablet (25 mg total) by mouth daily.    Dispense:  90 tablet    Refill:  1    Order Specific Question:   Supervising Provider    Answer:   Quentin Angst [id number]  . lisinopril (PRINIVIL,ZESTRIL) 40 MG tablet    Sig: Take 1  tablet (40 mg total) by mouth daily.    Dispense:  90 tablet    Refill:  1    Order Specific Question:   Supervising Provider    Answer:   Quentin Angst [id number]  . nitroGLYCERIN (NITROSTAT) 0.4 MG SL tablet    Sig: Place 1 tablet (0.4 mg total) under the tongue every 5 (five) minutes as needed for chest pain.    Dispense:  45 tablet    Refill:  1    Order Specific Question:   Supervising Provider    Answer:   Quentin Angst [id number]    Follow-up: Return in about 2 weeks (around 05/22/2017) for full physical and BP check.Marland Kitchen  Loletta Specteroger David Gomez PA

## 2017-05-08 NOTE — Patient Instructions (Signed)
Community Resources  Advocacy/Legal Legal Aid [place]:  1-866-219-5262  /  336-272-0148  Family Justice Center:  336-641-7233  [facility] 24-hr Crisis line:  336-273-7273  Women's Resource Center, [place]:  336-275-6090  Court Watch (custody):  336-275-2346  [place] [facility]:   336-279-9299    Baby & Breastfeeding Car Seat Inspection @ Various [place] Fire Depts.- call 336-373-2177  [facility]  336-832-6860  [facility] 336-878-6712  WIC: 336-641-3663 ([place]);  336-641-7571 ([place])  La Leche League:  1-877-452-5321   Childcare [place] [facility]: 336-369-5097 ([place]) / 336-887-8224 ([place])  - Child Care Resources/ Referrals/ Scholarships  - Head Start/ Early Head Start (call or apply online)  [place] DHHS: [place] Pre-K :  1-800-859-0829 / 336-274-5437   Employment / Job Search [facility] [place]: 336-275-6090 / [address]  [place] [employer] ([employer]Link): 336-373-5922 ([place]) / 336-882-4141 ([place])  [facility]/ Career Center: 336-275-9801 / 336-282-7307  [facility] Job & Career Center: 336-373-3764  DHHS Work First: 336-641-3447 ([place]) / 336-641-3447 ([place])  [facility] [place]:  336-676-5871   Financial Assistance [facility]:  336-553-2657  [facility]: 336-235-0368  [employer] (furniture):  336-370-4002  [place] [facility]: 336-373-4264  Low Income Energy Assistance  336-641-3000   Food Assistance DHHS- SNAP/ Food Stamps: 336-641-4588  WIC: [place]- 336-641-3663 ;  [place] 336-641-7571  Little Green Book- Free Meals  Little Blue Book- Free Food Pantries  During the summer, text "FOOD" to 877877   General Health / Clinics (Adults) Orange Card (for Adults) through [facility]: (336) 895-4900  [facility]:   336-832-8035  [facility]:   336-832-4444  Health Department:  336-641-3245  [facility]:  336-415-3877 / 336-641-2100  [facility] [place]:   336-373-0678  [facility]:   336-334-4822 x 50251   Housing [place] [facility]:   336-691-9521  [place] Housing Authority:  336-275-8501  Affordable Housing Managemnt:  336-273-0568   Immigrant/ Refugee Center for [place]ians (UNCG):  336-256-1065  [facility]:  336-379-0037  [facility] [facility]:  336-937-4701  Church World Services:  336-617-0381  African Services Coalition:  336-574-2677   LGBTQ YouthSAFE  www.youthsafe[place].org  PFLAG  336-541-6754 / info@[place].org  The Trevor Project:  1-866-488-7386   Mental Health/ Substance Use [facility]  336-387-6161  [facility]:  336-832-9700 or 1-800-711-2635  [facility] of Care:  336-271-5888  [facility]:  336-294-1349  [facility]:  336-542-2884  [facility] (walk-ins)  336-676-6840 / [address]  [place]:  800-449-1287  Alcoholics Anonymous:  336-854-4278  Narcotics Anonymous:  800-365-1036  Quit Smoking Hotline:  800-QUIT-NOW (800-784-8669)   Parenting Children's Home Society:  800-632-1400  [place]: Education Center & Support Groups:  336-832-6682  YWCA: 336-273-3461  [employer]CG: Bringing Out the Best:  336-334-3120               Thriving at Three (Hispanic families): 336-256-1066  Healthy Start ([facility]):  336-387-6161 x2288  Parents as Teachers:  336-691-0024  [place] [facility]- Learning Together (Immigrants): 336-369-5001   Poison Control 800-222-1222  Sports & Recreation [employer] Open Doors Application: ymcanwnc.org/join/open-doors-financial-assistance/  City of [place] Recreation Centers: http://www.[place].gov/index.aspx?page=3615   Special Needs Family Support Network:  336-832-6507  Autism Society of :   336-333-0197 x1402 or x1412 /  800-785-1035  [facility] [place]:  336-334-5773     ARC of [place]:  859-884-46785862361464  Children's Developmental Service Agency (CDSA):  367-557-0951435-543-5896  [facility] (Care Coordination for Children):  (925)[phone]   Transportation Medicaid Transportation: (570)047-1049(385) 883-1618 to apply  [place] [facility] Authority: (864) 448-1999670-072-4051 (reduced-fare bus ID to Medicaid/ Medicare/ Orange Card)  SCAT Paratransit services: Eligible riders only, call [phone] for application   Tutoring/Mentoring [facility] Child [facility] Institute: [phone]  [facility] Sisters: ([phone] 541-364-2302([place])  [phone] ([place])  ACES through child's school: [phone](503)[phone]  [employer] Achievers: contact your local [place] DysY  SHIELD Mentor Program: 313-595-8453309-525-7470     Managing Your Hypertension Hypertension is commonly called high blood pressure. This is when the force of your blood pressing against the walls of your arteries is too strong. Arteries are blood vessels that carry blood from your heart throughout your body. Hypertension forces the heart to work harder to pump blood, and may cause the arteries to become narrow or stiff. Having untreated or uncontrolled hypertension can cause heart attack, stroke, kidney disease, and other problems. What are blood pressure readings? A blood pressure reading consists of a higher number over a lower number. Ideally, your blood pressure should be below 120/80. The first ("top") number is called the systolic pressure. It is a measure of the pressure in your arteries as your heart beats. The second ("bottom") number is called the diastolic pressure. It is a measure of the pressure in your arteries as the heart relaxes. What does my blood pressure reading mean? Blood pressure is classified into four stages. Based on your blood pressure reading, your health care provider may use the following stages to determine what type of treatment you need, if any. Systolic pressure and diastolic pressure are measured in a unit called mm Hg. Normal  Systolic pressure: below  120.  Diastolic pressure: below 80. Elevated  Systolic pressure: 120-129.  Diastolic pressure: below 80. Hypertension stage 1  Systolic pressure: 130-139.  Diastolic pressure: 80-89. Hypertension stage 2  Systolic pressure: 140 or above.  Diastolic pressure: 90 or above. What health risks are associated with hypertension? Managing your hypertension is an important responsibility. Uncontrolled hypertension can lead to:  A heart attack.  A stroke.  A weakened blood vessel (aneurysm).  Heart failure.  Kidney damage.  Eye damage.  Metabolic syndrome.  Memory and concentration problems.  What changes can I make to manage my hypertension? Hypertension can be managed by making lifestyle changes and possibly by taking medicines. Your health care provider will help you make a plan to bring your blood pressure within a normal range. Eating and drinking  Eat a diet that is high in fiber and potassium, and low in salt (sodium), added sugar, and fat. An example eating plan is called the DASH (Dietary Approaches to Stop Hypertension) diet. To eat this way: ? Eat plenty of fresh fruits and vegetables. Try to fill half of your plate at each meal with fruits and vegetables. ? Eat whole grains, such as whole wheat pasta, brown rice, or whole grain bread. Fill about one quarter of your plate with whole grains. ? Eat low-fat diary products. ? Avoid fatty cuts of meat, processed or cured meats, and poultry with skin. Fill about one quarter of your plate with lean proteins such as fish, chicken without skin, beans, eggs, and tofu. ? Avoid premade and processed foods. These tend to be higher in sodium, added sugar, and fat.  Reduce your daily sodium intake. Most people with hypertension should eat less than 1,500 mg of sodium a day.  Limit alcohol intake  to no more than 1 drink a day for nonpregnant women and 2 drinks a day for men. One drink equals 12 oz of beer, 5 oz of wine, or 1 oz of  hard liquor. Lifestyle  Work with your health care provider to maintain a healthy body weight, or to lose weight. Ask what an ideal weight is for you.  Get at least 30 minutes of exercise that causes your heart to beat faster (aerobic exercise) most days of the week. Activities may include walking, swimming, or biking.  Include exercise to strengthen your muscles (resistance exercise), such as weight lifting, as part of your weekly exercise routine. Try to do these types of exercises for 30 minutes at least 3 days a week.  Do not use any products that contain nicotine or tobacco, such as cigarettes and e-cigarettes. If you need help quitting, ask your health care provider.  Control any long-term (chronic) conditions you have, such as high cholesterol or diabetes. Monitoring  Monitor your blood pressure at home as told by your health care provider. Your personal target blood pressure may vary depending on your medical conditions, your age, and other factors.  Have your blood pressure checked regularly, as often as told by your health care provider. Working with your health care provider  Review all the medicines you take with your health care provider because there may be side effects or interactions.  Talk with your health care provider about your diet, exercise habits, and other lifestyle factors that may be contributing to hypertension.  Visit your health care provider regularly. Your health care provider can help you create and adjust your plan for managing hypertension. Will I need medicine to control my blood pressure? Your health care provider may prescribe medicine if lifestyle changes are not enough to get your blood pressure under control, and if:  Your systolic blood pressure is 130 or higher.  Your diastolic blood pressure is 80 or higher.  Take medicines only as told by your health care provider. Follow the directions carefully. Blood pressure medicines must be taken as  prescribed. The medicine does not work as well when you skip doses. Skipping doses also puts you at risk for problems. Contact a health care provider if:  You think you are having a reaction to medicines you have taken.  You have repeated (recurrent) headaches.  You feel dizzy.  You have swelling in your ankles.  You have trouble with your vision. Get help right away if:  You develop a severe headache or confusion.  You have unusual weakness or numbness, or you feel faint.  You have severe pain in your chest or abdomen.  You vomit repeatedly.  You have trouble breathing. Summary  Hypertension is when the force of blood pumping through your arteries is too strong. If this condition is not controlled, it may put you at risk for serious complications.  Your personal target blood pressure may vary depending on your medical conditions, your age, and other factors. For most people, a normal blood pressure is less than 120/80.  Hypertension is managed by lifestyle changes, medicines, or both. Lifestyle changes include weight loss, eating a healthy, low-sodium diet, exercising more, and limiting alcohol. This information is not intended to replace advice given to you by your health care provider. Make sure you discuss any questions you have with your health care provider. Document Released: 07/01/2012 Document Revised: 09/04/2016 Document Reviewed: 09/04/2016 Elsevier Interactive Patient Education  [employer].

## 2017-05-28 ENCOUNTER — Ambulatory Visit (INDEPENDENT_AMBULATORY_CARE_PROVIDER_SITE_OTHER): Payer: Self-pay | Admitting: Physician Assistant

## 2017-10-06 ENCOUNTER — Encounter (HOSPITAL_COMMUNITY): Payer: Self-pay | Admitting: Neurology

## 2017-10-06 ENCOUNTER — Emergency Department (HOSPITAL_COMMUNITY)
Admission: EM | Admit: 2017-10-06 | Discharge: 2017-10-06 | Disposition: A | Discharge status: Home / Self Care | Attending: Emergency Medicine | Admitting: Emergency Medicine

## 2017-10-06 ENCOUNTER — Emergency Department (HOSPITAL_COMMUNITY)

## 2017-10-06 ENCOUNTER — Other Ambulatory Visit: Payer: Self-pay

## 2017-10-06 DIAGNOSIS — R079 Chest pain, unspecified: Secondary | ICD-10-CM

## 2017-10-06 LAB — BASIC METABOLIC PANEL WITH GFR
Anion gap: 8 (ref 5–15)
BUN: 6 mg/dL (ref 6–20)
CO2: 23 mmol/L (ref 22–32)
Calcium: 8.9 mg/dL (ref 8.9–10.3)
Chloride: 107 mmol/L (ref 101–111)
Creatinine, Ser: 1.02 mg/dL (ref 0.61–1.24)
GFR calc Af Amer: >60 mL/min
GFR calc non Af Amer: >60 mL/min
Glucose, Bld: 93 mg/dL (ref 65–99)
Potassium: 3.8 mmol/L (ref 3.5–5.1)
Sodium: 138 mmol/L (ref 135–145)

## 2017-10-06 LAB — CBC
HCT: 42.9 % (ref 39.0–52.0)
HEMOGLOBIN: 14.6 g/dL (ref 13.0–17.0)
MCH: 29.9 pg (ref 26.0–34.0)
MCHC: 34 g/dL (ref 30.0–36.0)
MCV: 87.7 fL (ref 78.0–100.0)
Platelets: 231 10*3/uL (ref 150–400)
RBC: 4.89 MIL/uL (ref 4.22–5.81)
RDW: 13.2 % (ref 11.5–15.5)
WBC: 4.3 10*3/uL (ref 4.0–10.5)

## 2017-10-06 LAB — I-STAT TROPONIN, ED
TROPONIN I, POC: 0 ng/mL (ref 0.00–0.08)
Troponin i, poc: 0.01 ng/mL (ref 0.00–0.08)

## 2017-10-06 MED ORDER — LISINOPRIL-HYDROCHLOROTHIAZIDE 20-12.5 MG PO TABS: 2.0000 | ORAL_TABLET | Freq: Every day | ORAL | 1 refills | Status: DC

## 2017-10-06 MED ORDER — NITROGLYCERIN 0.4 MG SL SUBL
0.4000 mg | SUBLINGUAL_TABLET | SUBLINGUAL | Status: DC | PRN
  Filled 2017-10-06: qty 1

## 2017-10-06 MED ORDER — HYDROCHLOROTHIAZIDE 25 MG PO TABS
25.0000 mg | ORAL_TABLET | Freq: Every day | ORAL | Status: DC
  Administered 2017-10-06: 25 mg via ORAL
  Filled 2017-10-06: qty 1

## 2017-10-06 MED ORDER — LISINOPRIL 20 MG PO TABS
40.0000 mg | ORAL_TABLET | Freq: Once | ORAL | Status: AC
  Administered 2017-10-06: 40 mg via ORAL
  Filled 2017-10-06: qty 2

## 2017-10-06 NOTE — ED Triage Notes (Addendum)
Pt reports last night felt "gas pain" in chest, BP 203/106, had h/a, took BC, and old BP meds (HCTZ). Woke up this morning feeling lightheaded, CP, h/a. Left/middle cp, denies sob, n/v. At current is a x 4, skin warm and dry, 4/10 CP. Reports mild MI in June. Does report same sx, as prior MI.

## 2017-10-06 NOTE — ED Provider Notes (Signed)
[facility] Provider Note   CSN: [id number] Arrival date & time: 10/06/17  [id number]  History   Chief Complaint Chief Complaint  Patient presents with  . Chest Pain   HPI Stephen Salinesaron D Royall is a 41 y.o. male with hypertension and NSTEMI in June 2018 who presented to the ED with left non-radiating midsternal chest pain. He states that the pain woke him up from sleep this AM and he initially thought it was a "gas bubble." Initially it was a 10/10 but with rest it is now down to a 3/10. He did not take anything for the pain. He states that this AM he thought it was very similar to the chest pain he experienced in June with his NSTEMI. He does not have nitro at home and did not take any. Pain does seem to get some relief with movement but is inconsistent. Associated symptoms including dizziness, lightheadedness, and nausea. He did use cocaine 3 days ago.   Last night he states that he had a headache. He checked his blood pressure and his BSP was >200. He has not been on BP medications for approximately 6 months due to financial issues. He did take an old HCTZ and BC powder last night. He continues to have a headache.   He otherwise has been feeling well and denies any fever/chills, vomiting, abdominal pain, dysuria, polyuria, cough, SOB, visual changes, new arthralgias or myalgias.   Past Medical History:  Diagnosis Date  . Hypertension   . NSTEMI (non-ST elevated myocardial infarction) [facility](HCC)    Patient Active Problem List   Diagnosis Date Noted  . Polysubstance abuse (HCC) 04/07/2017  . Tobacco abuse 04/07/2017  . NSTEMI (non-ST elevated myocardial infarction) (HCC) 04/07/2017  . Hypertensive emergency 04/07/2017  . Hypokalemia 04/07/2017  . Hip pain 01/12/2014  . Sinus congestion 01/12/2014  . Asthma 01/06/2014  . HTN (hypertension) 12/30/2013  . Borderline hyperglycemia 12/30/2013  . Chronic generalized abdominal pain 12/30/2013  . Headache  12/30/2013  . Urinary frequency 12/30/2013  . Non-suicidal depressed mood 12/30/2013  . Tobacco abuse counseling 12/30/2013  . Screening for hyperlipidemia 12/30/2013   Past Surgical History:  Procedure Laterality Date  . DENTAL SURGERY      Home Medications    Prior to Admission medications   Medication Sig Start Date End Date Taking? Authorizing Provider  Aspirin-Salicylamide-Caffeine (BC HEADACHE PO) Take 1 packet by mouth as needed (headache).   Yes [provider]  hydrochlorothiazide (HYDRODIURIL) 25 MG tablet Take 1 tablet (25 mg total) by mouth daily. 05/08/17  Yes Loletta SpecterGomez, Roger David, PA-C  OVER THE COUNTER MEDICATION Take 1 tablet by mouth as needed (Headache). Headache PM   Yes [provider]  atorvastatin (LIPITOR) 40 MG tablet Take 1 tablet (40 mg total) by mouth daily at 6 PM. Patient not taking: Reported on 10/06/2017 05/08/17   Loletta SpecterGomez, Roger David, PA-C  cloNIDine (CATAPRES) 0.1 MG tablet Take 1 tablet (0.1 mg total) by mouth 2 (two) times daily. Patient not taking: Reported on 10/06/2017 05/08/17   Loletta SpecterGomez, Roger David, PA-C  diltiazem (DILACOR XR) 240 MG 24 hr capsule Take 1 capsule (240 mg total) by mouth daily. Patient not taking: Reported on 10/06/2017 05/08/17   Loletta SpecterGomez, Roger David, PA-C  lisinopril (PRINIVIL,ZESTRIL) 40 MG tablet Take 1 tablet (40 mg total) by mouth daily. Patient not taking: Reported on 10/06/2017 05/08/17   Loletta SpecterGomez, Roger David, PA-C  lisinopril-hydrochlorothiazide (PRINZIDE,ZESTORETIC) 20-12.5 MG tablet Take 2 tablets by mouth daily. 10/06/17  Levora Dredge, MD  nitroGLYCERIN (NITROSTAT) 0.4 MG SL tablet Place 1 tablet (0.4 mg total) under the tongue every 5 (five) minutes as needed for chest pain. Patient not taking: Reported on 10/06/2017 05/08/17   Loletta Specter, PA-C   Family History Family History  Problem Relation Age of Onset  . Hypertension Mother   . Diabetes Mellitus II Mother    Social History Social History    Tobacco Use  . Smoking status: Current Every Day Smoker    Packs/day: 0.50    Types: Cigarettes  . Smokeless tobacco: Never Used  Substance Use Topics  . Alcohol use: Yes    Comment: seldom  . Drug use: Yes    Types: Marijuana, Cocaine    Comment: everyday   Allergies   Carrot [daucus carota] and Banana  Review of Systems All systems reviewed and are negative for acute change except as noted in the HPI.  Physical Exam Updated Vital Signs BP (!) 158/91   Pulse (!) 59   Temp 97.6 F (36.4 C) (Oral)   Resp 15   Ht 6' (1.829 m)   Wt 95.3 kg (210 lb)   SpO2 97%   BMI 28.48 kg/m   General: Well nourished male in no acute distress HENT: Normocephalic, atraumatic, moist mucus membranes  Pulm: Good air movement with no wheezing or crackles  CV: RRR, no murmurs, no rubs Abdomen: Active bowel sounds, soft, non-distended, no tenderness to palpation  Extremities: No LE edema, pulses palpable in all extremities Skin: Warm and dry  Neuro: Alert and oriented x 3  EKG: Sinus rhythm with normal axis and intervals. Scooping of the ST segment in the inferior leads and T wave inversion in V5 and V6. All findings are stable compared to EKG on 04/26/2017  ED Treatments / Results  Labs (all labs ordered are listed, but only abnormal results are displayed) Labs Reviewed  BASIC METABOLIC PANEL  CBC  I-STAT TROPONIN, ED  I-STAT TROPONIN, ED   EKG  EKG Interpretation  Date/Time:  Monday October 06 2017 08:35:16 EST Ventricular Rate:  68 PR Interval:    QRS Duration: 97 QT Interval:  398 QTC Calculation: 424 R Axis:   98 Text Interpretation:  Sinus rhythm Left ventricular hypertrophy Anterior infarct, old Abnormal T, probable ischemia, lateral leads Borderline ST elevation, lateral leads Baseline wander in lead(s) V5 Non-specific ST-t changes in lead II, V4,5,6 but no apparent new ST Elevations Confirmed by Shaune Pollack [phone]) on 10/06/2017 8:42:50 AM Also confirmed by  Shaune Pollack [phone]), editor Madalyn Rob [phone])  on 10/06/2017 11:26:00 AM      Radiology Dg Chest 2 View  Result Date: 10/06/2017 CLINICAL DATA:  41 year old male with chest pain for 1 day. EXAM: CHEST  2 VIEW COMPARISON:  04/06/2017 and prior chest radiographs FINDINGS: The cardiomediastinal silhouette is unremarkable. There is no evidence of focal airspace disease, pulmonary edema, suspicious pulmonary nodule/mass, pleural effusion, or pneumothorax. No acute bony abnormalities are identified. IMPRESSION: No active cardiopulmonary disease. Electronically Signed   By: Harmon Pier M.D.   On: 10/06/2017 09:11   Procedures Procedures (including critical care time)  Medications Ordered in ED Medications  nitroGLYCERIN (NITROSTAT) SL tablet 0.4 mg (0.4 mg Sublingual Not Given 10/06/17 1212)  hydrochlorothiazide (HYDRODIURIL) tablet 25 mg (25 mg Oral Given 10/06/17 1210)  lisinopril (PRINIVIL,ZESTRIL) tablet 40 mg (40 mg Oral Given 10/06/17 1210)   Initial Impression / Assessment and Plan / ED Course  I have reviewed the triage vital signs  and the nursing notes.  Pertinent labs & imaging results that were available during my care of the patient were reviewed by me and considered in my medical decision making (see chart for details).    41 y.o male with a history of hypertension, NSTEMI, tobacco abuse, and cocaine use who presented to the ED with acute left non-radiating mid-sternal chest pain. Associated symptoms include dizziness, lightheadedness, and nausea. EKG unchanged compare to prior and troponin x 2 negative with the most recent troponin checked >6 hours after the painful episode.   Hypertensive urgency. No signs of end-organ damage. Supposed to be on Lisinopril, HCTZ, and diltiazem. Restarted Lisinopril and HCTZ. Combination pill of Lisinopril/HCTZ would cost $6.50 with Goodrx. He was discharged with a prescription.   The patient's chest pain appears noncardiac in origin  and his pain has completely resolved. His clinical information is reassuring and at this point I think it is less likely he is experiencing ACS, myocarditis, pericarditis, PE, aortic dissection, pneumonia. Plan was discussed with the patient. He gets insurance in 10/2017 and will select a PCP at that point. All questions and conditions addressed. Standard return precautions given. He was discharged in stable condition.   Final Clinical Impressions(s) / ED Diagnoses   Final diagnoses:  Chest pain, unspecified type   ED Discharge Orders        Ordered    lisinopril-hydrochlorothiazide (PRINZIDE,ZESTORETIC) 20-12.5 MG tablet  Daily     10/06/17 1411       Levora DredgeHelberg, Valisha Heslin, MD 10/06/17 1411    Gwyneth SproutPlunkett, Burby, MD 10/06/17 2231

## 2017-10-06 NOTE — Discharge Instructions (Signed)
Thank you for allowing us to provide your care. Please obtain your BP medicine and follow up with a primary care doctor as soon as possible.

## 2017-11-30 ENCOUNTER — Encounter (HOSPITAL_COMMUNITY): Payer: Self-pay | Admitting: *Deleted

## 2017-11-30 ENCOUNTER — Emergency Department (HOSPITAL_COMMUNITY)
Admission: EM | Admit: 2017-11-30 | Discharge: 2017-11-30 | Disposition: A | Discharge status: Home / Self Care | Attending: Emergency Medicine | Admitting: Emergency Medicine

## 2017-11-30 ENCOUNTER — Other Ambulatory Visit: Payer: Self-pay

## 2017-11-30 DIAGNOSIS — F17210 Nicotine dependence, cigarettes, uncomplicated: Secondary | ICD-10-CM | POA: Insufficient documentation

## 2017-11-30 DIAGNOSIS — I252 Old myocardial infarction: Secondary | ICD-10-CM | POA: Insufficient documentation

## 2017-11-30 DIAGNOSIS — K047 Periapical abscess without sinus: Secondary | ICD-10-CM | POA: Insufficient documentation

## 2017-11-30 DIAGNOSIS — I10 Essential (primary) hypertension: Secondary | ICD-10-CM | POA: Insufficient documentation

## 2017-11-30 DIAGNOSIS — R590 Localized enlarged lymph nodes: Secondary | ICD-10-CM | POA: Insufficient documentation

## 2017-11-30 MED ORDER — PENICILLIN V POTASSIUM 500 MG PO TABS: 500.0000 mg | ORAL_TABLET | Freq: Four times a day (QID) | ORAL | 0 refills | Status: DC

## 2017-11-30 MED ORDER — NAPROXEN 500 MG PO TABS: 500.0000 mg | ORAL_TABLET | Freq: Two times a day (BID) | ORAL | 0 refills | Status: DC

## 2017-11-30 MED ORDER — OXYCODONE-ACETAMINOPHEN 5-325 MG PO TABS
1.0000 | ORAL_TABLET | Freq: Once | ORAL | Status: AC
  Administered 2017-11-30: 1 via ORAL
  Filled 2017-11-30: qty 1

## 2017-11-30 MED ORDER — PENICILLIN V POTASSIUM 500 MG PO TABS
500.0000 mg | ORAL_TABLET | Freq: Once | ORAL | Status: AC
  Administered 2017-11-30: 500 mg via ORAL
  Filled 2017-11-30: qty 1

## 2017-11-30 NOTE — Discharge Instructions (Signed)
Take the prescribed medication as directed. Follow-up with dentist.  Dr. Lucky CowboyKnox is on call or you may find your own clinic. Return to the ED for new or worsening symptoms.

## 2017-11-30 NOTE — ED Notes (Signed)
Pt does have hx of HTN, PA aware. Pt takes medications in the am. He has been taking them as directed.

## 2017-11-30 NOTE — ED Triage Notes (Signed)
Pt stated "started having face pain around lunch yesterday.  I feel like a lump in my right lower jaw."  Pt has several right upper teeth decayed below the gum line.

## 2017-11-30 NOTE — ED Provider Notes (Signed)
[facility]-EMERGENCY DEPT Provider Note   CSN: [id number] Arrival date & time: 11/30/17  0430     History   Chief Complaint Chief Complaint  Patient presents with  . Facial Pain    HPI Stephen West is a 42 y.o. male.  The history is provided by the patient and medical records.   42 year old male with history of hypertension and NSTEMI, presenting to the ED with right-sided dental pain.  States this has been going on for about 48 hours, but worse over the past 24 hours.  States it feels like "nerve pain" in his face.  States his teeth on the right side are sore and it is starting to feel swollen in his gums.  He denies any difficulty swallowing.  No fever or chills.  He is not currently established with a dentist.  Of note, patient's blood pressure is elevated here.  States he is due for his blood pressure medication this morning.  Thinks it is also because of pain.  He denies any headache, chest pain, shortness of breath, dizziness, or weakness.  Past Medical History:  Diagnosis Date  . Hypertension   . NSTEMI (non-ST elevated myocardial infarction) [facility])     Patient Active Problem List   Diagnosis Date Noted  . Polysubstance abuse (HCC) 04/07/2017  . Tobacco abuse 04/07/2017  . NSTEMI (non-ST elevated myocardial infarction) (HCC) 04/07/2017  . Hypertensive emergency 04/07/2017  . Hypokalemia 04/07/2017  . Hip pain 01/12/2014  . Sinus congestion 01/12/2014  . Asthma 01/06/2014  . HTN (hypertension) 12/30/2013  . Borderline hyperglycemia 12/30/2013  . Chronic generalized abdominal pain 12/30/2013  . Headache 12/30/2013  . Urinary frequency 12/30/2013  . Non-suicidal depressed mood 12/30/2013  . Tobacco abuse counseling 12/30/2013  . Screening for hyperlipidemia 12/30/2013    Past Surgical History:  Procedure Laterality Date  . DENTAL SURGERY         Home Medications    Prior to Admission medications   Medication Sig Start Date End  Date Taking? Authorizing Provider  Aspirin-Salicylamide-Caffeine (BC HEADACHE PO) Take 1 packet by mouth as needed (headache).    [provider]  atorvastatin (LIPITOR) 40 MG tablet Take 1 tablet (40 mg total) by mouth daily at 6 PM. Patient not taking: Reported on 10/06/2017 05/08/17   Loletta Specter, PA-C  cloNIDine (CATAPRES) 0.1 MG tablet Take 1 tablet (0.1 mg total) by mouth 2 (two) times daily. Patient not taking: Reported on 10/06/2017 05/08/17   Loletta Specter, PA-C  diltiazem (DILACOR XR) 240 MG 24 hr capsule Take 1 capsule (240 mg total) by mouth daily. Patient not taking: Reported on 10/06/2017 05/08/17   Loletta Specter, PA-C  hydrochlorothiazide (HYDRODIURIL) 25 MG tablet Take 1 tablet (25 mg total) by mouth daily. 05/08/17   Loletta Specter, PA-C  lisinopril (PRINIVIL,ZESTRIL) 40 MG tablet Take 1 tablet (40 mg total) by mouth daily. Patient not taking: Reported on 10/06/2017 05/08/17   Loletta Specter, PA-C  lisinopril-hydrochlorothiazide (PRINZIDE,ZESTORETIC) 20-12.5 MG tablet Take 2 tablets by mouth daily. 10/06/17   Levora Dredge, MD  nitroGLYCERIN (NITROSTAT) 0.4 MG SL tablet Place 1 tablet (0.4 mg total) under the tongue every 5 (five) minutes as needed for chest pain. Patient not taking: Reported on 10/06/2017 05/08/17   Loletta Specter, PA-C  OVER THE COUNTER MEDICATION Take 1 tablet by mouth as needed (Headache). Headache PM    [provider]    Family History Family History  Problem Relation Age  of Onset  . Hypertension Mother   . Diabetes Mellitus II Mother     Social History Social History   Tobacco Use  . Smoking status: Current Every Day Smoker    Packs/day: 0.50    Types: Cigarettes  . Smokeless tobacco: Never Used  Substance Use Topics  . Alcohol use: Yes    Comment: seldom  . Drug use: Yes    Types: Marijuana, Cocaine    Comment: everyday     Allergies   Carrot [daucus carota] and Banana   Review of  Systems Review of Systems  HENT: Positive for dental problem.   All other systems reviewed and are negative.    Physical Exam Updated Vital Signs BP (!) 214/154 (BP Location: Left Arm)   Pulse 79   Temp 98 F (36.7 C) (Oral)   Resp 18   Ht 6' (1.829 m)   Wt 88.5 kg (195 lb)   SpO2 100%   BMI 26.45 kg/m   Physical Exam  Constitutional: He is oriented to person, place, and time. He appears well-developed and well-nourished.  HENT:  Head: Normocephalic and atraumatic.  Mouth/Throat: Oropharynx is clear and moist.  Teeth largely in very poor dentition, multiple teeth are missing, right upper gums are diffusely swollen without discrete abscess or drainable fluid collection, handling secretions appropriately, no trismus, no facial or neck swelling, normal phonation without stridor  Eyes: Conjunctivae and EOM are normal. Pupils are equal, round, and reactive to light.  Neck: Normal range of motion.  Cardiovascular: Normal rate, regular rhythm and normal heart sounds.  Pulmonary/Chest: Effort normal and breath sounds normal. No stridor. No respiratory distress.  Abdominal: Soft. Bowel sounds are normal. There is no tenderness. There is no rebound.  Musculoskeletal: Normal range of motion.  Lymphadenopathy:    He has cervical adenopathy.  Neurological: He is alert and oriented to person, place, and time.  Skin: Skin is warm and dry.  Psychiatric: He has a normal mood and affect.  Nursing note and vitals reviewed.    ED Treatments / Results  Labs (all labs ordered are listed, but only abnormal results are displayed) Labs Reviewed - No data to display  EKG  EKG Interpretation None       Radiology No results found.  Procedures Procedures (including critical care time)  Medications Ordered in ED Medications - No data to display   Initial Impression / Assessment and Plan / ED Course  I have reviewed the triage vital signs and the nursing notes.  Pertinent labs &  imaging results that were available during my care of the patient were reviewed by me and considered in my medical decision making (see chart for details).  42 year old male here with right-sided dental pain.  He is afebrile and nontoxic.  He has very poor dentition on exam.  Right upper gums are diffusely swollen and irritated without discrete abscess or drainable fluid collection.  He has no significant facial or neck swelling to suggest Ludwig's angina.  He is handling his secretions well.  Does have enlarged cervical lymph nodes which I suspect are reactive from dental infection.  We will plan to start antibiotics and have him follow-up closely with dentist.  Patient's blood pressure is elevated here.  He denies any headache, chest pain, shortness of breath, dizziness, or weakness.  Suspect this may be somewhat elevated due to pain.  I have low suspicion for acute I have offered him a dose of his blood pressure medication here, states he  would prefer to take it once he gets home.  Final Clinical Impressions(s) / ED Diagnoses   Final diagnoses:  Dental infection    ED Discharge Orders        Ordered    penicillin v potassium (VEETID) 500 MG tablet  4 times daily     11/30/17 0604    naproxen (NAPROSYN) 500 MG tablet  2 times daily with meals     11/30/17 0604       Garlon HatchetSanders, Edyn Popoca M, PA-C 11/30/17 [id number]    Ward, Layla MawKristen N, DO 11/30/17 [phone]

## 2017-12-22 ENCOUNTER — Emergency Department (HOSPITAL_COMMUNITY)
Admission: EM | Admit: 2017-12-22 | Discharge: 2017-12-22 | Discharge status: Against Medical Advice | Attending: Emergency Medicine | Admitting: Emergency Medicine

## 2017-12-22 ENCOUNTER — Emergency Department (HOSPITAL_COMMUNITY)

## 2017-12-22 ENCOUNTER — Encounter (HOSPITAL_COMMUNITY): Payer: Self-pay | Admitting: Emergency Medicine

## 2017-12-22 DIAGNOSIS — Z79899 Other long term (current) drug therapy: Secondary | ICD-10-CM | POA: Insufficient documentation

## 2017-12-22 DIAGNOSIS — F17210 Nicotine dependence, cigarettes, uncomplicated: Secondary | ICD-10-CM | POA: Insufficient documentation

## 2017-12-22 DIAGNOSIS — J45909 Unspecified asthma, uncomplicated: Secondary | ICD-10-CM | POA: Insufficient documentation

## 2017-12-22 DIAGNOSIS — I252 Old myocardial infarction: Secondary | ICD-10-CM | POA: Diagnosis not present

## 2017-12-22 DIAGNOSIS — R0789 Other chest pain: Secondary | ICD-10-CM | POA: Insufficient documentation

## 2017-12-22 DIAGNOSIS — I10 Essential (primary) hypertension: Secondary | ICD-10-CM | POA: Diagnosis not present

## 2017-12-22 DIAGNOSIS — R079 Chest pain, unspecified: Secondary | ICD-10-CM

## 2017-12-22 LAB — BASIC METABOLIC PANEL
Anion gap: 10 (ref 5–15)
BUN: 16 mg/dL (ref 6–20)
CHLORIDE: 102 mmol/L (ref 101–111)
CO2: 25 mmol/L (ref 22–32)
Calcium: 8.9 mg/dL (ref 8.9–10.3)
Creatinine, Ser: 1.02 mg/dL (ref 0.61–1.24)
GFR calc Af Amer: >60 mL/min (ref >60)
GFR calc non Af Amer: >60 mL/min (ref >60)
GLUCOSE: 88 mg/dL (ref 65–99)
POTASSIUM: 3.6 mmol/L (ref 3.5–5.1)
Sodium: 137 mmol/L (ref 135–145)

## 2017-12-22 LAB — CBC WITH DIFFERENTIAL/PLATELET
BASOS PCT: 1 %
Basophils Absolute: 0 10*3/uL (ref 0.0–0.1)
EOS PCT: 13 %
Eosinophils Absolute: 0.6 10*3/uL (ref 0.0–0.7)
HCT: 42.1 % (ref 39.0–52.0)
Hemoglobin: 14 g/dL (ref 13.0–17.0)
Lymphocytes Relative: 45 %
Lymphs Abs: 1.9 10*3/uL (ref 0.7–4.0)
MCH: 29.7 pg (ref 26.0–34.0)
MCHC: 33.3 g/dL (ref 30.0–36.0)
MCV: 89.4 fL (ref 78.0–100.0)
MONO ABS: 0.3 10*3/uL (ref 0.1–1.0)
MONOS PCT: 8 %
Neutro Abs: 1.4 10*3/uL — ABNORMAL LOW (ref 1.7–7.7)
Neutrophils Relative %: 33 %
Platelets: 307 10*3/uL (ref 150–400)
RBC: 4.71 MIL/uL (ref 4.22–5.81)
RDW: 13.8 % (ref 11.5–15.5)
WBC: 4.3 10*3/uL (ref 4.0–10.5)

## 2017-12-22 LAB — I-STAT TROPONIN, ED
Troponin i, poc: 0.01 ng/mL (ref 0.00–0.08)
Troponin i, poc: 0 ng/mL (ref 0.00–0.08)

## 2017-12-22 LAB — CBG MONITORING, ED: Glucose-Capillary: 76 mg/dL (ref 65–99)

## 2017-12-22 NOTE — ED Notes (Signed)
Patient leaving ama. Patient aware of risks.

## 2017-12-22 NOTE — Discharge Instructions (Signed)
You were recommended for admission tonight into the hospital for monitoring and further testing. Leaving today poses many risks, not limited to heart attach or death. Return to the ER for new or worsening symptoms; including worsening chest pain, shortness of breath, pain that radiates to the arm or neck, pain or shortness of breath worsened with exertion.

## 2017-12-22 NOTE — ED Notes (Signed)
Patient in handcuffs on arrival. Law enforcement at bedside. Both radial pulses equal and strong

## 2017-12-22 NOTE — ED Triage Notes (Signed)
Per [facility] patient complaining of intermittent chest left sided chest pain that began last night around 8pm. Patient reports radiation into left shoulder and back. No pain on arrival. EKG unremarkable with ems. Ems administered 324 mg aspirin and 1 nitro pta.

## 2017-12-22 NOTE — ED Notes (Signed)
EDP aware of bp.  

## 2017-12-22 NOTE — ED Provider Notes (Signed)
[facility] EMERGENCY DEPARTMENT Provider Note   CSN: [id number] Arrival date & time: 12/22/17  0945     History   Chief Complaint Chief Complaint  Patient presents with  . Chest Pain    HPI Stephen West is a 42 y.o. male with past medical history of hypertension, and STEMI, polysubstance abuse, [facility] for acute onset of central and left-sided chest pain that began last night at 8 PM.  Patient states pain began at rest, described as a tight aching sensation, radiating around to the left chest.  He states episodes came frequently throughout the night.  Denies associated diaphoresis, nausea, or shortness of breath.  EMS administered aspirin and nitroglycerin which provided patient with relief of symptoms.  Reports he is currently asymptomatic in the ED.  States he has not had cardiac catheterization done in the past.  Per chart review, echocardiogram done on 04/07/2017, with EF of 55-60%.  The history is provided by the patient.    Past Medical History:  Diagnosis Date  . Hypertension   . NSTEMI (non-ST elevated myocardial infarction) [facility])     Patient Active Problem List   Diagnosis Date Noted  . Polysubstance abuse (HCC) 04/07/2017  . Tobacco abuse 04/07/2017  . NSTEMI (non-ST elevated myocardial infarction) (HCC) 04/07/2017  . Hypertensive emergency 04/07/2017  . Hypokalemia 04/07/2017  . Hip pain 01/12/2014  . Sinus congestion 01/12/2014  . Asthma 01/06/2014  . HTN (hypertension) 12/30/2013  . Borderline hyperglycemia 12/30/2013  . Chronic generalized abdominal pain 12/30/2013  . Headache 12/30/2013  . Urinary frequency 12/30/2013  . Non-suicidal depressed mood 12/30/2013  . Tobacco abuse counseling 12/30/2013  . Screening for hyperlipidemia 12/30/2013    Past Surgical History:  Procedure Laterality Date  . DENTAL SURGERY         Home Medications    Prior to Admission medications   Medication Sig Start Date End  Date Taking? Authorizing Provider  Aspirin-Salicylamide-Caffeine (BC HEADACHE PO) Take 1 packet by mouth as needed (headache).    [provider]  atorvastatin (LIPITOR) 40 MG tablet Take 1 tablet (40 mg total) by mouth daily at 6 PM. Patient not taking: Reported on 10/06/2017 05/08/17   Loletta Specter, PA-C  cloNIDine (CATAPRES) 0.1 MG tablet Take 1 tablet (0.1 mg total) by mouth 2 (two) times daily. Patient not taking: Reported on 10/06/2017 05/08/17   Loletta Specter, PA-C  diltiazem (DILACOR XR) 240 MG 24 hr capsule Take 1 capsule (240 mg total) by mouth daily. Patient not taking: Reported on 10/06/2017 05/08/17   Loletta Specter, PA-C  hydrochlorothiazide (HYDRODIURIL) 25 MG tablet Take 1 tablet (25 mg total) by mouth daily. 05/08/17   Loletta Specter, PA-C  lisinopril (PRINIVIL,ZESTRIL) 40 MG tablet Take 1 tablet (40 mg total) by mouth daily. Patient not taking: Reported on 10/06/2017 05/08/17   Loletta Specter, PA-C  lisinopril-hydrochlorothiazide (PRINZIDE,ZESTORETIC) 20-12.5 MG tablet Take 2 tablets by mouth daily. 10/06/17   Levora Dredge, MD  naproxen (NAPROSYN) 500 MG tablet Take 1 tablet (500 mg total) by mouth 2 (two) times daily with a meal. 11/30/17   Garlon Hatchet, PA-C  nitroGLYCERIN (NITROSTAT) 0.4 MG SL tablet Place 1 tablet (0.4 mg total) under the tongue every 5 (five) minutes as needed for chest pain. Patient not taking: Reported on 10/06/2017 05/08/17   Loletta Specter, PA-C  OVER THE COUNTER MEDICATION Take 1 tablet by mouth as needed (Headache). Headache PM  [provider]  penicillin v potassium (VEETID) 500 MG tablet Take 1 tablet (500 mg total) by mouth 4 (four) times daily. 11/30/17   Garlon HatchetSanders, Lisa M, PA-C    Family History Family History  Problem Relation Age of Onset  . Hypertension Mother   . Diabetes Mellitus II Mother     Social History Social History   Tobacco Use  . Smoking status: Current Every Day Smoker     Packs/day: 0.50    Types: Cigarettes  . Smokeless tobacco: Never Used  Substance Use Topics  . Alcohol use: Yes    Alcohol/week: 3.0 oz    Types: 5 Cans of beer per week  . Drug use: Yes    Types: Marijuana, Cocaine    Comment: everyday marijauna. cocaine 2-3 times week      Allergies   Carrot [daucus carota] and Banana   Review of Systems Review of Systems  Constitutional: Negative for chills and fever.  Respiratory: Negative for shortness of breath.   Cardiovascular: Positive for chest pain. Negative for palpitations and leg swelling.  Gastrointestinal: Negative for abdominal pain and nausea.  All other systems reviewed and are negative.    Physical Exam Updated Vital Signs BP (!) 150/109   Pulse 61   Temp 97.9 F (36.6 C) (Oral)   Resp 12   Ht 6' (1.829 m)   Wt 93 kg (205 lb)   SpO2 99%   BMI 27.80 kg/m   Physical Exam  Constitutional: He appears well-developed and well-nourished. He does not appear ill. No distress.  HENT:  Head: Normocephalic and atraumatic.  Mouth/Throat: Oropharynx is clear and moist.  Eyes: Conjunctivae are normal.  Neck: Normal range of motion. Neck supple. No JVD present.  Cardiovascular: Normal rate, regular rhythm, normal heart sounds, intact distal pulses and normal pulses.  Pulmonary/Chest: Effort normal and breath sounds normal. No respiratory distress. He exhibits no tenderness.  Abdominal: Soft. Bowel sounds are normal. He exhibits no distension. There is no tenderness. There is no rebound and no guarding.  Musculoskeletal:  No lower extremity edema or tenderness.  Neurological: He is alert.  Skin: Skin is warm.  Psychiatric: He has a normal mood and affect. His behavior is normal.  Nursing note and vitals reviewed.    ED Treatments / Results  Labs (all labs ordered are listed, but only abnormal results are displayed) Labs Reviewed  CBC WITH DIFFERENTIAL/PLATELET - Abnormal; Notable for the following components:       Result Value   Neutro Abs 1.4 (*)    All other components within normal limits  BASIC METABOLIC PANEL  I-STAT TROPONIN, ED  I-STAT TROPONIN, ED  CBG MONITORING, ED    EKG  EKG Interpretation  Date/Time:  Monday December 22 2017 09:51:11 EST Ventricular Rate:  55 PR Interval:    QRS Duration: 110 QT Interval:  449 QTC Calculation: 430 R Axis:   85 Text Interpretation:  Sinus rhythm Left ventricular hypertrophy Anterior Q waves, possibly due to LVH Abnrm T, probable ischemia, anterolateral lds No significant change since last tracing Confirmed by Vanetta MuldersZackowski, Scott [phone]([id number]) on 12/22/2017 10:00:06 AM       Radiology Dg Chest 2 View  Result Date: 12/22/2017 CLINICAL DATA:  Chest pain. EXAM: CHEST  2 VIEW COMPARISON:  Chest x-ray dated October 06, 2017. FINDINGS: The heart size and mediastinal contours are within normal limits. Both lungs are clear. The visualized skeletal structures are unremarkable. IMPRESSION: No active cardiopulmonary disease. Electronically Signed   By:  Obie Dredge M.D.   On: 12/22/2017 10:40    Procedures Procedures (including critical care time)  Medications Ordered in ED Medications - No data to display   Initial Impression / Assessment and Plan / ED Course  I have reviewed the triage vital signs and the nursing notes.  Pertinent labs & imaging results that were available during my care of the patient were reviewed by me and considered in my medical decision making (see chart for details).  Clinical Course as of Dec 23 1602  Mon Dec 22, 2017  1240 Reevaluated.  He remains chest pain-free since arrival to the ED, after receiving aspirin and nitroglycerin.  Discussed plan for repeat troponin at 1:30.  Also discussed recommendation for admission for chest pain rule out.  [JR]    Clinical Course User Index [JR] Shamal Stracener, [place] N, PA-C    Patient presenting to the ED with persistent episodes of chest pain throughout the night, beginning at 8 PM last  night.  Chest pains resolved with aspirin and nitroglycerin administered by EMS.  Patient with cardiac history of NSTEMI, no history of cardiac catheterization.  Heart sounds normal.  Lungs clear to auscultation bilaterally.  CBC and BMP unremarkable.  EKG nonischemic.  Chest x-ray negative.  Troponin negative x2.  Given patient's significant cardiac history, improvement in symptoms with nitroglycerin and aspirin, and moderate heart score of 6, recommend admission for cardiac rule out.  Patient stating he does not want admission, and is of sound mind to make this decision.  Discussed risks of discharge, and answered questions fully.  Patient signed out AMA with strict return precautions.  Patient discussed with Dr. Deretha Emory.  We discussed the nature and purpose, risks and benefits, as well as, the alternatives of treatment. Time was given to allow the opportunity to ask questions and consider their options, and after the discussion, the patient decided to refuse the offerred treatment. The patient was informed that refusal could lead to, but was not limited to, death, permanent disability, or severe pain. If present, I asked the relatives or significant others to dissuade them without success. Prior to refusing, I determined that the patient had the capacity to make their decision and understood the consequences of that decision. After refusal, I made every reasonable opportunity to treat them to the best of my ability.  The patient was notified that they may return to the emergency department at any time for further treatment.    Final Clinical Impressions(s) / ED Diagnoses   Final diagnoses:  Left sided chest pain    ED Discharge Orders    None       Lauralyn Shadowens, [place] N, PA-C 12/22/17 1604    Vanetta Mulders, MD 12/23/17 [phone]

## 2018-06-08 ENCOUNTER — Ambulatory Visit (INDEPENDENT_AMBULATORY_CARE_PROVIDER_SITE_OTHER): Payer: Self-pay | Admitting: Physician Assistant

## 2018-06-24 ENCOUNTER — Encounter (HOSPITAL_COMMUNITY): Payer: Self-pay

## 2018-06-24 DIAGNOSIS — Z9114 Patient's other noncompliance with medication regimen: Secondary | ICD-10-CM | POA: Diagnosis not present

## 2018-06-24 DIAGNOSIS — H5711 Ocular pain, right eye: Secondary | ICD-10-CM | POA: Diagnosis present

## 2018-06-24 DIAGNOSIS — I10 Essential (primary) hypertension: Secondary | ICD-10-CM | POA: Diagnosis not present

## 2018-06-24 DIAGNOSIS — H0289 Other specified disorders of eyelid: Secondary | ICD-10-CM | POA: Diagnosis not present

## 2018-06-24 DIAGNOSIS — F17210 Nicotine dependence, cigarettes, uncomplicated: Secondary | ICD-10-CM | POA: Insufficient documentation

## 2018-06-24 DIAGNOSIS — N289 Disorder of kidney and ureter, unspecified: Secondary | ICD-10-CM | POA: Insufficient documentation

## 2018-06-24 DIAGNOSIS — J45909 Unspecified asthma, uncomplicated: Secondary | ICD-10-CM | POA: Diagnosis not present

## 2018-06-24 DIAGNOSIS — Z79899 Other long term (current) drug therapy: Secondary | ICD-10-CM | POA: Diagnosis not present

## 2018-06-24 DIAGNOSIS — H538 Other visual disturbances: Secondary | ICD-10-CM | POA: Diagnosis not present

## 2018-06-24 NOTE — ED Triage Notes (Signed)
Pt complains of having blurred vision and being out of his blood pressure medications

## 2018-06-25 ENCOUNTER — Other Ambulatory Visit: Payer: Self-pay

## 2018-06-25 ENCOUNTER — Emergency Department (HOSPITAL_COMMUNITY)
Admission: EM | Admit: 2018-06-25 | Discharge: 2018-06-25 | Disposition: A | Discharge status: Home / Self Care | Payer: BLUE CROSS/BLUE SHIELD | Attending: Emergency Medicine | Admitting: Emergency Medicine

## 2018-06-25 DIAGNOSIS — Z9114 Patient's other noncompliance with medication regimen: Secondary | ICD-10-CM

## 2018-06-25 DIAGNOSIS — Z91148 Patient's other noncompliance with medication regimen for other reason: Secondary | ICD-10-CM

## 2018-06-25 DIAGNOSIS — I10 Essential (primary) hypertension: Secondary | ICD-10-CM

## 2018-06-25 DIAGNOSIS — H0289 Other specified disorders of eyelid: Secondary | ICD-10-CM

## 2018-06-25 DIAGNOSIS — N289 Disorder of kidney and ureter, unspecified: Secondary | ICD-10-CM

## 2018-06-25 DIAGNOSIS — H538 Other visual disturbances: Secondary | ICD-10-CM

## 2018-06-25 LAB — BASIC METABOLIC PANEL
ANION GAP: 8 (ref 5–15)
BUN: 18 mg/dL (ref 6–20)
CO2: 29 mmol/L (ref 22–32)
CREATININE: 1.26 mg/dL — AB (ref 0.61–1.24)
Calcium: 9.1 mg/dL (ref 8.9–10.3)
Chloride: 105 mmol/L (ref 98–111)
GLUCOSE: 102 mg/dL — AB (ref 70–99)
Potassium: 3.7 mmol/L (ref 3.5–5.1)
Sodium: 142 mmol/L (ref 135–145)

## 2018-06-25 LAB — URINALYSIS, ROUTINE W REFLEX MICROSCOPIC
Bacteria, UA: NONE SEEN
Bilirubin Urine: NEGATIVE
Glucose, UA: NEGATIVE mg/dL
Hgb urine dipstick: NEGATIVE
Ketones, ur: 5 mg/dL — AB
LEUKOCYTES UA: NEGATIVE
NITRITE: NEGATIVE
PROTEIN: NEGATIVE mg/dL
Specific Gravity, Urine: 1.03 (ref 1.005–1.030)
pH: 5 (ref 5.0–8.0)

## 2018-06-25 LAB — CBC WITH DIFFERENTIAL/PLATELET
BASOS ABS: 0 10*3/uL (ref 0.0–0.1)
BASOS PCT: 1 %
EOS ABS: 0.6 10*3/uL (ref 0.0–0.7)
EOS PCT: 8 %
HEMATOCRIT: 42.3 % (ref 39.0–52.0)
Hemoglobin: 14.3 g/dL (ref 13.0–17.0)
Lymphocytes Relative: 42 %
Lymphs Abs: 2.9 10*3/uL (ref 0.7–4.0)
MCH: 30.4 pg (ref 26.0–34.0)
MCHC: 33.8 g/dL (ref 30.0–36.0)
MCV: 90 fL (ref 78.0–100.0)
MONO ABS: 0.5 10*3/uL (ref 0.1–1.0)
Monocytes Relative: 7 %
NEUTROS ABS: 2.9 10*3/uL (ref 1.7–7.7)
Neutrophils Relative %: 42 %
PLATELETS: 257 10*3/uL (ref 150–400)
RBC: 4.7 MIL/uL (ref 4.22–5.81)
RDW: 14 % (ref 11.5–15.5)
WBC: 6.9 10*3/uL (ref 4.0–10.5)

## 2018-06-25 MED ORDER — CLONIDINE HCL 0.1 MG PO TABS
0.1000 mg | ORAL_TABLET | Freq: Once | ORAL | Status: AC
  Administered 2018-06-25: 0.1 mg via ORAL
  Filled 2018-06-25: qty 1

## 2018-06-25 MED ORDER — LISINOPRIL 40 MG PO TABS: 40.0000 mg | ORAL_TABLET | Freq: Every day | ORAL | 0 refills | Status: DC

## 2018-06-25 MED ORDER — ATORVASTATIN CALCIUM 40 MG PO TABS: 40.0000 mg | ORAL_TABLET | Freq: Every day | ORAL | Status: DC

## 2018-06-25 MED ORDER — DILTIAZEM HCL ER 240 MG PO CP24: 240.0000 mg | ORAL_CAPSULE | Freq: Every day | ORAL | 0 refills | Status: DC

## 2018-06-25 MED ORDER — LISINOPRIL 20 MG PO TABS
40.0000 mg | ORAL_TABLET | Freq: Once | ORAL | Status: AC
  Administered 2018-06-25: 40 mg via ORAL
  Filled 2018-06-25: qty 2

## 2018-06-25 MED ORDER — CLONIDINE HCL 0.1 MG PO TABS: 0.1000 mg | ORAL_TABLET | Freq: Two times a day (BID) | ORAL | 0 refills | Status: DC

## 2018-06-25 MED ORDER — DILTIAZEM HCL ER COATED BEADS 240 MG PO CP24
240.0000 mg | ORAL_CAPSULE | Freq: Once | ORAL | Status: AC
  Administered 2018-06-25: 240 mg via ORAL
  Filled 2018-06-25: qty 1

## 2018-06-25 MED ORDER — HYDROCHLOROTHIAZIDE 25 MG PO TABS: 25.0000 mg | ORAL_TABLET | Freq: Every day | ORAL | 0 refills | Status: DC

## 2018-06-25 MED ORDER — HYDROCHLOROTHIAZIDE 12.5 MG PO CAPS
25.0000 mg | ORAL_CAPSULE | Freq: Once | ORAL | Status: AC
  Administered 2018-06-25: 25 mg via ORAL
  Filled 2018-06-25: qty 2

## 2018-06-25 NOTE — Discharge Instructions (Signed)
It is very important for you to take your blood pressure medication every day, exactly as prescribed.  Do not allow your prescriptions to run out.  Please monitor your blood pressure at home and keep a record of it.  Take this record with you when you see your primary care provider.  The cause for your eye pain is not clear.  Please apply warm compresses several times a day.  It is possible this is a stye that is in the process of developing.  Please follow-up with the ophthalmologist.

## 2018-06-25 NOTE — ED Provider Notes (Signed)
[facility]-EMERGENCY DEPT Provider Note   CSN: [id number] Arrival date & time: 06/24/18  2225     History   Chief Complaint Chief Complaint  Patient presents with  . Blurred Vision    HPI Stephen West is a 42 y.o. male.  The history is provided by the patient.  He has history of hypertension, asthma, non-STEMI and comes in complaining of problems with his right eye.  He states that he woke up yesterday with pain in his eyes which had actually been better when he woke up this morning.  He did have one episode where his vision got blurred for a couple of minutes before resolving.  Today, he has noted that there is some discomfort in the medial aspect of his right and it is painful to close his eye.  He feels that it is swollen.  Of note, he is supposed to be taking medication for blood pressure and cholesterol but has been out of his medication for at least a month.  He is scheduled to see his primary care provider next week.  He denies headache, tinnitus but has had some epistaxis.  He is also complaining that he wakes up at night with some spasms in his neck.  He does continue to smoke.  He denies any weakness, numbness, tingling.  Past Medical History:  Diagnosis Date  . Hypertension   . NSTEMI (non-ST elevated myocardial infarction) [facility])     Patient Active Problem List   Diagnosis Date Noted  . Polysubstance abuse (HCC) 04/07/2017  . Tobacco abuse 04/07/2017  . NSTEMI (non-ST elevated myocardial infarction) (HCC) 04/07/2017  . Hypertensive emergency 04/07/2017  . Hypokalemia 04/07/2017  . Hip pain 01/12/2014  . Sinus congestion 01/12/2014  . Asthma 01/06/2014  . HTN (hypertension) 12/30/2013  . Borderline hyperglycemia 12/30/2013  . Chronic generalized abdominal pain 12/30/2013  . Headache 12/30/2013  . Urinary frequency 12/30/2013  . Non-suicidal depressed mood 12/30/2013  . Tobacco abuse counseling 12/30/2013  . Screening for hyperlipidemia  12/30/2013    Past Surgical History:  Procedure Laterality Date  . DENTAL SURGERY          Home Medications    Prior to Admission medications   Medication Sig Start Date End Date Taking? Authorizing Provider  atorvastatin (LIPITOR) 40 MG tablet Take 1 tablet (40 mg total) by mouth daily at 6 PM. 05/08/17  Yes Loletta Specter, PA-C  cloNIDine (CATAPRES) 0.1 MG tablet Take 1 tablet (0.1 mg total) by mouth 2 (two) times daily. 05/08/17  Yes Loletta Specter, PA-C  hydrochlorothiazide (HYDRODIURIL) 25 MG tablet Take 1 tablet (25 mg total) by mouth daily. 05/08/17  Yes Loletta Specter, PA-C  lisinopril (PRINIVIL,ZESTRIL) 40 MG tablet Take 1 tablet (40 mg total) by mouth daily. 05/08/17  Yes Loletta Specter, PA-C  nitroGLYCERIN (NITROSTAT) 0.4 MG SL tablet Place 1 tablet (0.4 mg total) under the tongue every 5 (five) minutes as needed for chest pain. 05/08/17  Yes Loletta Specter, PA-C  diltiazem (DILACOR XR) 240 MG 24 hr capsule Take 1 capsule (240 mg total) by mouth daily. Patient not taking: Reported on 10/06/2017 05/08/17   Loletta Specter, PA-C  lisinopril-hydrochlorothiazide (PRINZIDE,ZESTORETIC) 20-12.5 MG tablet Take 2 tablets by mouth daily. Patient not taking: Reported on 06/25/2018 10/06/17   Levora Dredge, MD  naproxen (NAPROSYN) 500 MG tablet Take 1 tablet (500 mg total) by mouth 2 (two) times daily with a meal. Patient not taking: Reported on 06/25/2018 11/30/17  Garlon Hatchet, PA-C  penicillin v potassium (VEETID) 500 MG tablet Take 1 tablet (500 mg total) by mouth 4 (four) times daily. Patient not taking: Reported on 06/25/2018 11/30/17   Garlon Hatchet, PA-C    Family History Family History  Problem Relation Age of Onset  . Hypertension Mother   . Diabetes Mellitus II Mother     Social History Social History   Tobacco Use  . Smoking status: Current Every Day Smoker    Packs/day: 0.50    Types: Cigarettes  . Smokeless tobacco: Never Used  Substance Use  Topics  . Alcohol use: Yes    Alcohol/week: 5.0 standard drinks    Types: 5 Cans of beer per week  . Drug use: Yes    Types: Marijuana, Cocaine    Comment: everyday marijauna. cocaine 2-3 times week      Allergies   Carrot [daucus carota] and Banana   Review of Systems Review of Systems  All other systems reviewed and are negative.    Physical Exam Updated Vital Signs BP (!) 197/143 (BP Location: Left Arm)   Pulse 81   Temp 98.2 F (36.8 C) (Oral)   Resp 18   SpO2 97%   Physical Exam  Nursing note and vitals reviewed.  42 year old male, resting comfortably and in no acute distress. Vital signs are significant for markedly elevated blood pressure. Oxygen saturation is 97%, which is normal. Head is normocephalic and atraumatic. PERRLA, EOMI. Oropharynx is clear.  Funduscopic exam was suboptimal, but no hemorrhage or exudate seen.  Slight swelling of the medial aspect of the upper lid of the right eye with tenderness in the same area.  No definite hordeolum. Neck is nontender and supple without adenopathy or JVD. Back is nontender and there is no CVA tenderness. Lungs are clear without rales, wheezes, or rhonchi. Chest is nontender. Heart has regular rate and rhythm without murmur. Abdomen is soft, flat, nontender without masses or hepatosplenomegaly and peristalsis is normoactive. Extremities have no cyanosis or edema, full range of motion is present. Skin is warm and dry without rash. Neurologic: Mental status is normal, cranial nerves are intact, there are no motor or sensory deficits.  Visual fields intact to confrontation.  ED Treatments / Results  Labs (all labs ordered are listed, but only abnormal results are displayed) Labs Reviewed  BASIC METABOLIC PANEL - Abnormal; Notable for the following components:      Result Value   Glucose, Bld 102 (*)    Creatinine, Ser 1.26 (*)    All other components within normal limits  URINALYSIS, ROUTINE W REFLEX MICROSCOPIC  - Abnormal; Notable for the following components:   Ketones, ur 5 (*)    All other components within normal limits  CBC WITH DIFFERENTIAL/PLATELET   Procedures Procedures  Medications Ordered in ED Medications  lisinopril (PRINIVIL,ZESTRIL) tablet 40 mg (has no administration in time range)  hydrochlorothiazide (MICROZIDE) capsule 25 mg (has no administration in time range)  cloNIDine (CATAPRES) tablet 0.1 mg (has no administration in time range)  diltiazem (CARDIZEM CD) 24 hr capsule 240 mg (has no administration in time range)     Initial Impression / Assessment and Plan / ED Course  I have reviewed the triage vital signs and the nursing notes.  Pertinent labs & imaging results that were available during my care of the patient were reviewed by me and considered in my medical decision making (see chart for details).  Pain in the upper lid of  the right eye which may be an early hordeolum.  Will need to be referred to ophthalmology.  In the meantime, recommend warm compresses.  Episode of blurred vision of uncertain cause.  Poorly controlled hypertension with medication noncompliance.  Old records are reviewed, and he does have prior hospitalization for non-STEMI, elevated blood pressure on frequent ED visits.  Last office visit was May 08, 2017 and he was supposed to go for a recheck 2 weeks after that.  Importance of blood pressure control was stressed to the patient and have encouraged him to monitor his blood pressure at home.  He is started back on all of his home medications.  Given history of noncompliance, may need to consider switching to something other than clonidine, or perhaps starting him on a clonidine patch rather than relying on his taking pills 3 times a day.  Will check metabolic panel and urinalysis to make sure there is no evidence of endorgan damage.  Encouraged to stop smoking.  Blood pressure has come down, but not to normal.  Urinalysis shows no proteinuria, but  creatinine has increased over baseline.  I have discussed this with the patient including importance of ongoing medication compliance.  He is discharged with new prescriptions for his medications.  He is to keep his appointment with his primary care provider.  Referred to ophthalmology regarding his eye pain.  Final Clinical Impressions(s) / ED Diagnoses   Final diagnoses:  Blurred vision  Poorly-controlled hypertension  Noncompliance with medication regimen  Pain of right eyelid  Renal insufficiency    ED Discharge Orders         Ordered    diltiazem (DILACOR XR) 240 MG 24 hr capsule  Daily     06/25/18 0250    atorvastatin (LIPITOR) 40 MG tablet  Daily-1800     06/25/18 0250    cloNIDine (CATAPRES) 0.1 MG tablet  2 times daily     06/25/18 0250    hydrochlorothiazide (HYDRODIURIL) 25 MG tablet  Daily     06/25/18 0250    lisinopril (PRINIVIL,ZESTRIL) 40 MG tablet  Daily     06/25/18 0250           Dione Booze, MD 06/25/18 [phone]

## 2018-07-02 ENCOUNTER — Emergency Department (HOSPITAL_COMMUNITY)
Admission: EM | Admit: 2018-07-02 | Discharge: 2018-07-02 | Disposition: A | Discharge status: Home / Self Care | Payer: BLUE CROSS/BLUE SHIELD | Attending: Emergency Medicine | Admitting: Emergency Medicine

## 2018-07-02 ENCOUNTER — Encounter (HOSPITAL_COMMUNITY): Payer: Self-pay

## 2018-07-02 ENCOUNTER — Other Ambulatory Visit: Payer: Self-pay

## 2018-07-02 DIAGNOSIS — I10 Essential (primary) hypertension: Secondary | ICD-10-CM | POA: Insufficient documentation

## 2018-07-02 DIAGNOSIS — F17210 Nicotine dependence, cigarettes, uncomplicated: Secondary | ICD-10-CM | POA: Insufficient documentation

## 2018-07-02 NOTE — Discharge Instructions (Signed)
Continue your home medications as previously prescribed. Return to ED for worsening symptoms, chest pain, shortness of breath, headache with vision changes, lightheadedness.

## 2018-07-02 NOTE — ED Provider Notes (Signed)
[facility] EMERGENCY DEPARTMENT Provider Note   CSN: [id number] Arrival date & time: 07/02/18  0841     History   Chief Complaint Chief Complaint  Patient presents with  . Hypertension    HPI Stephen West is a 42 y.o. male with a past medical history of hypertension, prior and STEMI, who presents to ED to request adjusting his hypertension medications.  Patient has been on clonidine 0.1 twice a day, diltiazem to 40 mg every day, HCTZ 25 mg every day, lisinopril 40 mg every day.  Patient has been on this medication since 2017.  However, due to insurance issues at the beginning of the year he stopped taking it for several months.  Patient seen and evaluated on 9/5 and was put back on his regimen.  He is concerned because he feels like because of his work in [employer] outside in the heat, his blood pressures are "bottoming out" and he would like some changes to his medications.  He is scheduled to meet with a new primary care provider on 9/26.  He denies any chest pain, shortness of breath, headache, vision changes.  He reports compliance with his medications since 9/5.  HPI  Past Medical History:  Diagnosis Date  . Hypertension   . NSTEMI (non-ST elevated myocardial infarction) [facility])     Patient Active Problem List   Diagnosis Date Noted  . Polysubstance abuse (HCC) 04/07/2017  . Tobacco abuse 04/07/2017  . NSTEMI (non-ST elevated myocardial infarction) (HCC) 04/07/2017  . Hypertensive emergency 04/07/2017  . Hypokalemia 04/07/2017  . Hip pain 01/12/2014  . Sinus congestion 01/12/2014  . Asthma 01/06/2014  . HTN (hypertension) 12/30/2013  . Borderline hyperglycemia 12/30/2013  . Chronic generalized abdominal pain 12/30/2013  . Headache 12/30/2013  . Urinary frequency 12/30/2013  . Non-suicidal depressed mood 12/30/2013  . Tobacco abuse counseling 12/30/2013  . Screening for hyperlipidemia 12/30/2013    Past Surgical History:  Procedure Laterality  Date  . DENTAL SURGERY          Home Medications    Prior to Admission medications   Medication Sig Start Date End Date Taking? Authorizing Provider  atorvastatin (LIPITOR) 40 MG tablet Take 1 tablet (40 mg total) by mouth daily at 6 PM. 06/25/18   Dione Booze, MD  cloNIDine (CATAPRES) 0.1 MG tablet Take 1 tablet (0.1 mg total) by mouth 2 (two) times daily. 06/25/18   Dione Booze, MD  diltiazem (DILACOR XR) 240 MG 24 hr capsule Take 1 capsule (240 mg total) by mouth daily. 06/25/18   Dione Booze, MD  hydrochlorothiazide (HYDRODIURIL) 25 MG tablet Take 1 tablet (25 mg total) by mouth daily. 06/25/18   Dione Booze, MD  lisinopril (PRINIVIL,ZESTRIL) 40 MG tablet Take 1 tablet (40 mg total) by mouth daily. 06/25/18   Dione Booze, MD  nitroGLYCERIN (NITROSTAT) 0.4 MG SL tablet Place 1 tablet (0.4 mg total) under the tongue every 5 (five) minutes as needed for chest pain. 05/08/17   Loletta Specter, PA-C    Family History Family History  Problem Relation Age of Onset  . Hypertension Mother   . Diabetes Mellitus II Mother     Social History Social History   Tobacco Use  . Smoking status: Current Every Day Smoker    Packs/day: 0.50    Types: Cigarettes  . Smokeless tobacco: Never Used  Substance Use Topics  . Alcohol use: Yes    Alcohol/week: 5.0 standard drinks    Types: 5 Cans of beer  per week    Comment: occ  . Drug use: Yes    Types: Marijuana, Cocaine    Comment: everyday marijauna. cocaine 2-3 times week      Allergies   Carrot [daucus carota] and Banana   Review of Systems Review of Systems  Constitutional: Negative for chills and fever.  Eyes: Negative for visual disturbance.  Respiratory: Negative for shortness of breath.   Cardiovascular: Negative for chest pain.  Neurological: Negative for headaches.     Physical Exam Updated Vital Signs BP (!) 142/108 (BP Location: Right Arm)   Pulse 82   Temp 97.9 F (36.6 C) (Oral)   Resp 18   Ht 6' (1.829 m)   Wt  95.3 kg   SpO2 100%   BMI 28.48 kg/m   Physical Exam  Constitutional: He appears well-developed and well-nourished. No distress.  HENT:  Head: Normocephalic and atraumatic.  Eyes: Pupils are equal, round, and reactive to light. Conjunctivae and EOM are normal. No scleral icterus.  Neck: Normal range of motion.  Cardiovascular: Normal rate, regular rhythm and normal heart sounds.  Pulmonary/Chest: Effort normal and breath sounds normal. No respiratory distress.  Neurological: He is alert.  Skin: No rash noted. He is not diaphoretic.  Psychiatric: He has a normal mood and affect.  Nursing note and vitals reviewed.    ED Treatments / Results  Labs (all labs ordered are listed, but only abnormal results are displayed) Labs Reviewed - No data to display  EKG None  Radiology No results found.  Procedures Procedures (including critical care time)  Medications Ordered in ED Medications - No data to display   Initial Impression / Assessment and Plan / ED Course  I have reviewed the triage vital signs and the nursing notes.  Pertinent labs & imaging results that were available during my care of the patient were reviewed by me and considered in my medical decision making (see chart for details).     42 year old male with past medical history of hypertension, prior and STEMI presents to ED for evaluation of requesting hypertension medication regimen adjusted.  Patient has been on diltiazem daily, lisinopril daily, HCTZ daily, clonidine 0.1 twice a day for the past several years.  Due to insurance issues at the beginning of the year, he discontinued use of all these medications.  Patient seen and evaluated on 9/5 and was put back on this regimen.  He feels that the medications are "bottoming out" his pressures due to his work outside in [employer] in the heat.  He wants to know whether he can discontinue any of these medications.  On chart review it appears that patient's blood  pressures have been elevated as high as 190s to 200 systolic during ED visits.  I spoke to the pharmacist and we both agree that no medication adjustment is warranted at this time in the emergency room.  Rather educated patient on staying hydrated due to his work outside in the heat.  Although his appointment is later on in the month, I suggested that he call his primary care provider ahead of time and asked them if he could adjust any of these medications prior to his appointment. Patient's blood pressure initially elevated in the emergency department today. Patient denies headache, change in vision, numbness, weakness, chest pain, dyspnea, dizziness, or lightheadedness therefore doubt hypertensive emergency. Discussed elevated blood pressure with the patient and the need for primary care follow up with potential need to initiate or change antihypertensive medications and or  for further evaluation. Discussed return precaution signs/symptoms for hypertensive emergency as listed above with the patient.   Portions of this note were generated with Scientist, clinical (histocompatibility and immunogenetics)Dragon dictation software. Dictation errors may occur despite best attempts at proofreading.   Final Clinical Impressions(s) / ED Diagnoses   Final diagnoses:  Hypertension, unspecified type    ED Discharge Orders    None       Dietrich PatesKhatri, Genie Wenke, PA-C 07/02/18 1129    Alvira MondaySchlossman, Erin, MD 07/02/18 2109

## 2018-07-02 NOTE — ED Triage Notes (Signed)
Pt endorses recently being placed on blood pressure medication recently after being diagnosed with htn. Pt stopped taking it due to feeling groggy, randomly took his BP medication Tuesday and felt tired again, checked his BP and it was "low" Yesterday pt checked his BP and it was normal and normal again this morning. Pt concerned about medication routine.

## 2018-07-02 NOTE — ED Notes (Signed)
Declined W/C at D/C and was escorted to lobby by RN. 

## 2018-07-16 ENCOUNTER — Other Ambulatory Visit: Payer: Self-pay

## 2018-07-16 ENCOUNTER — Ambulatory Visit (INDEPENDENT_AMBULATORY_CARE_PROVIDER_SITE_OTHER): Payer: BLUE CROSS/BLUE SHIELD | Admitting: Physician Assistant

## 2018-07-16 ENCOUNTER — Encounter (INDEPENDENT_AMBULATORY_CARE_PROVIDER_SITE_OTHER): Payer: Self-pay | Admitting: Physician Assistant

## 2018-07-16 VITALS — BP 141/99 | HR 106 | Temp 98.1°F | Ht 72.0 in | Wt 208.0 lb

## 2018-07-16 DIAGNOSIS — J209 Acute bronchitis, unspecified: Secondary | ICD-10-CM

## 2018-07-16 DIAGNOSIS — I10 Essential (primary) hypertension: Secondary | ICD-10-CM | POA: Diagnosis not present

## 2018-07-16 DIAGNOSIS — R05 Cough: Secondary | ICD-10-CM

## 2018-07-16 DIAGNOSIS — R059 Cough, unspecified: Secondary | ICD-10-CM

## 2018-07-16 MED ORDER — DILTIAZEM HCL ER 240 MG PO CP24: 240.0000 mg | ORAL_CAPSULE | Freq: Every day | ORAL | 5 refills | Status: DC

## 2018-07-16 MED ORDER — CARVEDILOL 12.5 MG PO TABS: 12.5000 mg | ORAL_TABLET | Freq: Two times a day (BID) | ORAL | 5 refills | Status: DC

## 2018-07-16 MED ORDER — HYDROCHLOROTHIAZIDE 25 MG PO TABS: 25.0000 mg | ORAL_TABLET | Freq: Every day | ORAL | 5 refills | Status: DC

## 2018-07-16 MED ORDER — GUAIFENESIN ER 1200 MG PO TB12: 1.0000 | ORAL_TABLET | Freq: Two times a day (BID) | ORAL | 0 refills | Status: DC

## 2018-07-16 MED ORDER — BENZONATATE 200 MG PO CAPS: 200.0000 mg | ORAL_CAPSULE | Freq: Two times a day (BID) | ORAL | 0 refills | Status: DC | PRN

## 2018-07-16 MED ORDER — LISINOPRIL 40 MG PO TABS: 40.0000 mg | ORAL_TABLET | Freq: Every day | ORAL | 5 refills | Status: DC

## 2018-07-16 MED ORDER — ATORVASTATIN CALCIUM 40 MG PO TABS: 40.0000 mg | ORAL_TABLET | Freq: Every day | ORAL | 5 refills | Status: DC

## 2018-07-16 MED ORDER — NAPROXEN 500 MG PO TABS: 500.0000 mg | ORAL_TABLET | Freq: Two times a day (BID) | ORAL | 0 refills | Status: DC

## 2018-07-16 NOTE — Progress Notes (Signed)
Subjective:  Patient ID: BUEL MOLDER, male    DOB: [birth year]  Age: 42 y.o. MRN: [id number]  CC: HTN and URI  HPI Stephen West is a 42 y.o. male with a PMH of cocaine use, HTN and NSTEMI presents to f/u on HTN. Last BP 148/98 mmHg two months ago. Taking Clonidine, Diltiazem, HCTZ, and Lisinopril as directed. BP 141/99 mmHg today in clinic. He reports episodes of lightheadedness and "bottoming out" with clonidine. He suspends clonidine due to lightheadedness and soon finds his BP to be highly elevated. Does not endorse CP, palpitations, SOB, HA, tingling, numbness, abdominal pain, swelling, or GI/GU sxs.     Main complaint today is of chest congestion x2 days. Says he is coughing up yellowish phlegm. Had chills, fatigue, malaise, and tactile fever yesterday but is doing better today. Has not taken anything for relief. No close contacts with the same.      Outpatient Medications Prior to Visit  Medication Sig Dispense Refill  . atorvastatin (LIPITOR) 40 MG tablet Take 1 tablet (40 mg total) by mouth daily at 6 PM. 30 tablet 0a  . cloNIDine (CATAPRES) 0.1 MG tablet Take 1 tablet (0.1 mg total) by mouth 2 (two) times daily. 60 tablet 0  . diltiazem (DILACOR XR) 240 MG 24 hr capsule Take 1 capsule (240 mg total) by mouth daily. 30 capsule 0  . hydrochlorothiazide (HYDRODIURIL) 25 MG tablet Take 1 tablet (25 mg total) by mouth daily. 30 tablet 0  . lisinopril (PRINIVIL,ZESTRIL) 40 MG tablet Take 1 tablet (40 mg total) by mouth daily. 30 tablet 0  . nitroGLYCERIN (NITROSTAT) 0.4 MG SL tablet Place 1 tablet (0.4 mg total) under the tongue every 5 (five) minutes as needed for chest pain. 45 tablet 1   No facility-administered medications prior to visit.      ROS Review of Systems  Constitutional: Negative for chills, fever and malaise/fatigue.  HENT: Positive for congestion.   Eyes: Negative for blurred vision.  Respiratory: Positive for cough. Negative for shortness of breath.    Cardiovascular: Negative for chest pain and palpitations.  Gastrointestinal: Negative for abdominal pain and nausea.  Genitourinary: Negative for dysuria and hematuria.  Musculoskeletal: Negative for joint pain and myalgias.  Skin: Negative for rash.  Neurological: Negative for tingling and headaches.  Psychiatric/Behavioral: Negative for depression. The patient is not nervous/anxious.     Objective:  BP (!) 141/99 (BP Location: Right Arm, Patient Position: Sitting, Cuff Size: Large)   Pulse (!) 106   Temp 98.1 F (36.7 C) (Oral)   Ht 6' (1.829 m)   Wt 208 lb (94.3 kg)   SpO2 96%   BMI 28.21 kg/m   BP/Weight 07/16/2018 07/02/2018 06/25/2018  Systolic BP 141 125 155  Diastolic BP 99 96 111  Wt. (Lbs) 208 210 -  BMI 28.21 28.48 -      Physical Exam  Constitutional: He is oriented to person, place, and time.  Well developed, well nourished, NAD, polite  HENT:  Head: Normocephalic and atraumatic.  Eyes: No scleral icterus.  Neck: Normal range of motion. Neck supple. No thyromegaly present.  Cardiovascular: Normal rate, regular rhythm and normal heart sounds.  Pulmonary/Chest: Breath sounds normal. No respiratory distress. He has no wheezes. He has no rales.  Abdominal: Soft. Bowel sounds are normal. There is no tenderness.  Musculoskeletal: He exhibits no edema.  Neurological: He is alert and oriented to person, place, and time.  Skin: Skin is warm and dry. No rash noted.  No erythema. No pallor.  Psychiatric: He has a normal mood and affect. His behavior is normal. Thought content normal.  Vitals reviewed.    Assessment & Plan:   1. Essential hypertension - Stop Clonidine, seems he has episodes of hypotension when taking and rebound hypertension when not taking.  - Begin carvedilol (COREG) 12.5 MG tablet; Take 1 tablet (12.5 mg total) by mouth 2 (two) times daily with a meal.  Dispense: 60 tablet; Refill: 5 - Refill diltiazem (DILACOR XR) 240 MG 24 hr capsule; Take 1  capsule (240 mg total) by mouth daily.  Dispense: 30 capsule; Refill: 5 - Refill hydrochlorothiazide (HYDRODIURIL) 25 MG tablet; Take 1 tablet (25 mg total) by mouth daily.  Dispense: 30 tablet; Refill: 5 - Refill lisinopril (PRINIVIL,ZESTRIL) 40 MG tablet; Take 1 tablet (40 mg total) by mouth daily.  Dispense: 30 tablet; Refill: 5 - Refill atorvastatin (LIPITOR) 40 MG tablet; Take 1 tablet (40 mg total) by mouth daily at 6 PM.  Dispense: 30 tablet; Refill: 5  2. Acute bronchitis, unspecified organism - Begin Guaifenesin (MUCINEX MAXIMUM STRENGTH) 1200 MG TB12; Take 1 tablet (1,200 mg total) by mouth 2 (two) times daily.  Dispense: 14 tablet; Refill: 0  3. Cough - Begin benzonatate (TESSALON) 200 MG capsule; Take 1 capsule (200 mg total) by mouth 2 (two) times daily as needed for cough.  Dispense: 20 capsule; Refill: 0    Meds ordered this encounter  Medications  . carvedilol (COREG) 12.5 MG tablet    Sig: Take 1 tablet (12.5 mg total) by mouth 2 (two) times daily with a meal.    Dispense:  60 tablet    Refill:  5    Order Specific Question:   Supervising Provider    Answer:   Hoy Register [[id number]]  . diltiazem (DILACOR XR) 240 MG 24 hr capsule    Sig: Take 1 capsule (240 mg total) by mouth daily.    Dispense:  30 capsule    Refill:  5    Order Specific Question:   Supervising Provider    Answer:   Hoy Register [[id number]]  . hydrochlorothiazide (HYDRODIURIL) 25 MG tablet    Sig: Take 1 tablet (25 mg total) by mouth daily.    Dispense:  30 tablet    Refill:  5    Order Specific Question:   Supervising Provider    Answer:   Hoy Register [[id number]]  . lisinopril (PRINIVIL,ZESTRIL) 40 MG tablet    Sig: Take 1 tablet (40 mg total) by mouth daily.    Dispense:  30 tablet    Refill:  5    Order Specific Question:   Supervising Provider    Answer:   Hoy Register [[id number]]  . atorvastatin (LIPITOR) 40 MG tablet    Sig: Take 1 tablet (40 mg total) by mouth daily at 6 PM.    Dispense:   30 tablet    Refill:  5    Order Specific Question:   Supervising Provider    Answer:   Hoy Register [[id number]]  . Guaifenesin (MUCINEX MAXIMUM STRENGTH) 1200 MG TB12    Sig: Take 1 tablet (1,200 mg total) by mouth 2 (two) times daily.    Dispense:  14 tablet    Refill:  0    Order Specific Question:   Supervising Provider    Answer:   Hoy Register [[id number]]  . benzonatate (TESSALON) 200 MG capsule    Sig: Take 1 capsule (200 mg total) by mouth 2 (two)  times daily as needed for cough.    Dispense:  20 capsule    Refill:  0    Order Specific Question:   Supervising Provider    Answer:   Hoy Register [[id number]]  . naproxen (NAPROSYN) 500 MG tablet    Sig: Take 1 tablet (500 mg total) by mouth 2 (two) times daily with a meal.    Dispense:  14 tablet    Refill:  0    Order Specific Question:   Supervising Provider    Answer:   Hoy Register [[id number]]    Follow-up: Return in about 4 weeks (around 08/13/2018) for HTN.   Loletta Specter PA

## 2018-07-16 NOTE — Patient Instructions (Signed)

## 2018-07-30 ENCOUNTER — Emergency Department (HOSPITAL_COMMUNITY)
Admission: EM | Admit: 2018-07-30 | Discharge: 2018-07-30 | Disposition: A | Discharge status: Home / Self Care | Payer: BLUE CROSS/BLUE SHIELD | Attending: Emergency Medicine | Admitting: Emergency Medicine

## 2018-07-30 ENCOUNTER — Other Ambulatory Visit: Payer: Self-pay

## 2018-07-30 ENCOUNTER — Emergency Department (HOSPITAL_COMMUNITY): Payer: BLUE CROSS/BLUE SHIELD

## 2018-07-30 ENCOUNTER — Encounter (HOSPITAL_COMMUNITY): Payer: Self-pay

## 2018-07-30 DIAGNOSIS — Z79899 Other long term (current) drug therapy: Secondary | ICD-10-CM | POA: Diagnosis not present

## 2018-07-30 DIAGNOSIS — R05 Cough: Secondary | ICD-10-CM | POA: Diagnosis not present

## 2018-07-30 DIAGNOSIS — E876 Hypokalemia: Secondary | ICD-10-CM | POA: Insufficient documentation

## 2018-07-30 DIAGNOSIS — F17210 Nicotine dependence, cigarettes, uncomplicated: Secondary | ICD-10-CM | POA: Diagnosis not present

## 2018-07-30 DIAGNOSIS — E860 Dehydration: Secondary | ICD-10-CM

## 2018-07-30 DIAGNOSIS — I10 Essential (primary) hypertension: Secondary | ICD-10-CM | POA: Insufficient documentation

## 2018-07-30 DIAGNOSIS — J45909 Unspecified asthma, uncomplicated: Secondary | ICD-10-CM | POA: Diagnosis not present

## 2018-07-30 DIAGNOSIS — R0789 Other chest pain: Secondary | ICD-10-CM | POA: Insufficient documentation

## 2018-07-30 DIAGNOSIS — D72829 Elevated white blood cell count, unspecified: Secondary | ICD-10-CM | POA: Diagnosis not present

## 2018-07-30 DIAGNOSIS — R059 Cough, unspecified: Secondary | ICD-10-CM

## 2018-07-30 HISTORY — DX: Bronchitis, not specified as acute or chronic: J40

## 2018-07-30 LAB — BASIC METABOLIC PANEL
ANION GAP: 14 (ref 5–15)
BUN: 11 mg/dL (ref 6–20)
CO2: 25 mmol/L (ref 22–32)
Calcium: 9.2 mg/dL (ref 8.9–10.3)
Chloride: 100 mmol/L (ref 98–111)
Creatinine, Ser: 1.41 mg/dL — ABNORMAL HIGH (ref 0.61–1.24)
Glucose, Bld: 145 mg/dL — ABNORMAL HIGH (ref 70–99)
Potassium: 2.8 mmol/L — ABNORMAL LOW (ref 3.5–5.1)
SODIUM: 139 mmol/L (ref 135–145)

## 2018-07-30 LAB — CBC
HCT: 46.4 % (ref 39.0–52.0)
Hemoglobin: 15 g/dL (ref 13.0–17.0)
MCH: 29.2 pg (ref 26.0–34.0)
MCHC: 32.3 g/dL (ref 30.0–36.0)
MCV: 90.4 fL (ref 80.0–100.0)
NRBC: 0 % (ref 0.0–0.2)
Platelets: 381 10*3/uL (ref 150–400)
RBC: 5.13 MIL/uL (ref 4.22–5.81)
RDW: 13.3 % (ref 11.5–15.5)
WBC: 18.4 10*3/uL — AB (ref 4.0–10.5)

## 2018-07-30 LAB — D-DIMER, QUANTITATIVE (NOT AT ARMC): D DIMER QUANT: 0.46 ug{FEU}/mL (ref 0.00–0.50)

## 2018-07-30 LAB — POCT I-STAT TROPONIN I
TROPONIN I, POC: 0 ng/mL (ref 0.00–0.08)
Troponin i, poc: 0.04 ng/mL (ref 0.00–0.08)

## 2018-07-30 MED ORDER — SODIUM CHLORIDE 0.9 % IV BOLUS
1000.0000 mL | Freq: Once | INTRAVENOUS | Status: AC
  Administered 2018-07-30: 1000 mL via INTRAVENOUS

## 2018-07-30 MED ORDER — POTASSIUM CHLORIDE 10 MEQ/100ML IV SOLN
10.0000 meq | Freq: Once | INTRAVENOUS | Status: AC
  Administered 2018-07-30: 10 meq via INTRAVENOUS
  Filled 2018-07-30: qty 100

## 2018-07-30 MED ORDER — PREDNISONE 20 MG PO TABS: ORAL_TABLET | ORAL | 0 refills | Status: DC

## 2018-07-30 MED ORDER — POTASSIUM CHLORIDE CRYS ER 20 MEQ PO TBCR
40.0000 meq | EXTENDED_RELEASE_TABLET | Freq: Once | ORAL | Status: AC
  Administered 2018-07-30: 40 meq via ORAL
  Filled 2018-07-30: qty 2

## 2018-07-30 MED ORDER — POTASSIUM CHLORIDE CRYS ER 20 MEQ PO TBCR: 20.0000 meq | EXTENDED_RELEASE_TABLET | Freq: Every day | ORAL | 0 refills | Status: DC

## 2018-07-30 MED ORDER — ALBUTEROL SULFATE HFA 108 (90 BASE) MCG/ACT IN AERS
2.0000 | INHALATION_SPRAY | RESPIRATORY_TRACT | Status: DC | PRN
  Administered 2018-07-30: 2 via RESPIRATORY_TRACT
  Filled 2018-07-30: qty 6.7

## 2018-07-30 MED ORDER — BENZONATATE 200 MG PO CAPS: 200.0000 mg | ORAL_CAPSULE | Freq: Two times a day (BID) | ORAL | 0 refills | Status: DC | PRN

## 2018-07-30 MED ORDER — BENZONATATE 100 MG PO CAPS
100.0000 mg | ORAL_CAPSULE | Freq: Once | ORAL | Status: AC
  Administered 2018-07-30: 100 mg via ORAL
  Filled 2018-07-30: qty 1

## 2018-07-30 MED ORDER — PREDNISONE 20 MG PO TABS
60.0000 mg | ORAL_TABLET | Freq: Once | ORAL | Status: AC
  Administered 2018-07-30: 60 mg via ORAL
  Filled 2018-07-30: qty 3

## 2018-07-30 NOTE — Discharge Instructions (Signed)
Please take prednisone as prescribed.  Take tessalon as needed for cough.  Your potassium level is low today, take potassium supplementation as prescribed and eat a banana daily for 1 week.  Use albuterol inhaler 2 puffs every 4 hours as needed for shortness of breath.  Avoid cocaine use.  Return if you have any concerns.

## 2018-07-30 NOTE — ED Triage Notes (Addendum)
patient was diagnosed with acute bronchitis on 07/16/18 and states he has continued to cough. Patient c/o a productive cough with yellow and light green sputum.  At the end of triage patient stated that he last used cocaine and marijuana4 days ago and that he has cp at times.

## 2018-07-30 NOTE — ED Notes (Addendum)
Pt states he takes medications to control his blood pressure. He has taken it today, and is asymptomatic at this time; denies visual disturbances, headache, n/v.

## 2018-07-30 NOTE — ED Provider Notes (Signed)
[facility]-EMERGENCY DEPT Provider Note   CSN: [id number] Arrival date & time: 07/30/18  1826     History   Chief Complaint Chief Complaint  Patient presents with  . Cough  . Chest Pain    HPI Stephen West is a 42 y.o. male.  The history is provided by the patient and medical records. No language interpreter was used.  Cough  Associated symptoms include chest pain.  Chest Pain   Associated symptoms include cough.     42 year old male with history of cocaine abuse, prior NSTEMI, bronchitis, hypertension presenting complaining of chest discomfort.  Patient report for the past 2 days he has had chest congestion, cough productive with yellow sputum, pain in his chest when he coughs, having some night sweats and feeling fatigue.  Symptom worsening with persistent coughing.  Symptoms felt similar to bronchitis that he was diagnosed more than 2 weeks ago.  He does admits to using cocaine 2 days ago after his symptoms started and states that the cocaine actually helps with his breathing temporarily.  He denies any prior history of PE or DVT, no recent surgery, prolonged bedrest, active cancer, hemoptysis, leg swelling or calf pain.  Denies any history of COPD.  Past Medical History:  Diagnosis Date  . Bronchitis   . Hypertension   . NSTEMI (non-ST elevated myocardial infarction) [facility])     Patient Active Problem List   Diagnosis Date Noted  . Polysubstance abuse (HCC) 04/07/2017  . Tobacco abuse 04/07/2017  . NSTEMI (non-ST elevated myocardial infarction) (HCC) 04/07/2017  . Hypertensive emergency 04/07/2017  . Hypokalemia 04/07/2017  . Hip pain 01/12/2014  . Sinus congestion 01/12/2014  . Asthma 01/06/2014  . HTN (hypertension) 12/30/2013  . Borderline hyperglycemia 12/30/2013  . Chronic generalized abdominal pain 12/30/2013  . Headache 12/30/2013  . Urinary frequency 12/30/2013  . Non-suicidal depressed mood 12/30/2013  . Tobacco abuse counseling  12/30/2013  . Screening for hyperlipidemia 12/30/2013    Past Surgical History:  Procedure Laterality Date  . DENTAL SURGERY          Home Medications    Prior to Admission medications   Medication Sig Start Date End Date Taking? Authorizing Provider  atorvastatin (LIPITOR) 40 MG tablet Take 1 tablet (40 mg total) by mouth daily at 6 PM. 07/16/18   Loletta Specter, PA-C  benzonatate (TESSALON) 200 MG capsule Take 1 capsule (200 mg total) by mouth 2 (two) times daily as needed for cough. 07/16/18   Loletta Specter, PA-C  carvedilol (COREG) 12.5 MG tablet Take 1 tablet (12.5 mg total) by mouth 2 (two) times daily with a meal. 07/16/18   Loletta Specter, PA-C  diltiazem (DILACOR XR) 240 MG 24 hr capsule Take 1 capsule (240 mg total) by mouth daily. 07/16/18   Loletta Specter, PA-C  Guaifenesin [facility] MAXIMUM STRENGTH) 1200 MG TB12 Take 1 tablet (1,200 mg total) by mouth 2 (two) times daily. 07/16/18   Loletta Specter, PA-C  hydrochlorothiazide (HYDRODIURIL) 25 MG tablet Take 1 tablet (25 mg total) by mouth daily. 07/16/18   Loletta Specter, PA-C  lisinopril (PRINIVIL,ZESTRIL) 40 MG tablet Take 1 tablet (40 mg total) by mouth daily. 07/16/18   Loletta Specter, PA-C  naproxen (NAPROSYN) 500 MG tablet Take 1 tablet (500 mg total) by mouth 2 (two) times daily with a meal. 07/16/18   Loletta Specter, PA-C    Family History Family History  Problem Relation Age of Onset  .  Hypertension Mother   . Diabetes Mellitus II Mother     Social History Social History   Tobacco Use  . Smoking status: Current Every Day Smoker    Packs/day: 0.50    Types: Cigarettes  . Smokeless tobacco: Never Used  Substance Use Topics  . Alcohol use: Yes    Alcohol/week: 5.0 standard drinks    Types: 5 Cans of beer per week    Comment: occ  . Drug use: Yes    Types: Marijuana, Cocaine    Comment: last used 4 days ago     Allergies   Carrot [daucus carota] and Banana   Review of  Systems Review of Systems  Respiratory: Positive for cough.   Cardiovascular: Positive for chest pain.  All other systems reviewed and are negative.    Physical Exam Updated Vital Signs BP (!) 134/95 (BP Location: Left Arm)   Pulse 78   Temp 99.1 F (37.3 C) (Oral)   Resp 13   Ht 6' (1.829 m)   Wt 88.5 kg   SpO2 97%   BMI 26.45 kg/m   Physical Exam  Constitutional: He is oriented to person, place, and time. He appears well-developed and well-nourished. No distress.  HENT:  Head: Atraumatic.  Eyes: Conjunctivae are normal.  Neck: Neck supple.  Cardiovascular: Normal rate, regular rhythm and normal pulses.  Pulmonary/Chest: Effort normal. He has decreased breath sounds. He has no wheezes. He has rhonchi. He has no rales.  Abdominal: Soft. There is no tenderness.  Musculoskeletal: Normal range of motion.       Right lower leg: He exhibits no edema.       Left lower leg: He exhibits no edema.  Neurological: He is alert and oriented to person, place, and time.  Skin: No rash noted.  Psychiatric: He has a normal mood and affect.  Nursing note and vitals reviewed.    ED Treatments / Results  Labs (all labs ordered are listed, but only abnormal results are displayed) Labs Reviewed  BASIC METABOLIC PANEL - Abnormal; Notable for the following components:      Result Value   Potassium 2.8 (*)    Glucose, Bld 145 (*)    Creatinine, Ser 1.41 (*)    All other components within normal limits  CBC - Abnormal; Notable for the following components:   WBC 18.4 (*)    All other components within normal limits  D-DIMER, QUANTITATIVE (NOT AT [facility])  I-STAT TROPONIN, ED  POCT I-STAT TROPONIN I  I-STAT TROPONIN, ED  POCT I-STAT TROPONIN I    EKG EKG Interpretation  Date/Time:  Thursday July 30 2018 18:59:52 EDT Ventricular Rate:  86 PR Interval:    QRS Duration: 102 QT Interval:  373 QTC Calculation: 447 R Axis:   82 Text Interpretation:  Sinus rhythm Right atrial  enlargement LVH with secondary repolarization abnormality Anterior Q waves, possibly due to LVH HEART RATE INCREASED SINCE 12/22/17 Confirmed by Tilden Fossa [phone]) on 07/30/2018 7:48:48 PM     Radiology Dg Chest 2 View  Result Date: 07/30/2018 CLINICAL DATA:  patient was diagnosed with acute bronchitis on 07/16/18 and states he has continued to cough. Patient c/o a productive cough with yellow and light green sputum. Uses marijuana and cocaine. EXAM: CHEST - 2 VIEW COMPARISON:  12/22/2017 FINDINGS: The heart size and mediastinal contours are within normal limits. Both lungs are clear. The visualized skeletal structures are unremarkable. IMPRESSION: No active cardiopulmonary disease. Electronically Signed   By: Norva Pavlov  M.D.   On: 07/30/2018 19:29    Procedures Procedures (including critical care time)  Medications Ordered in ED Medications  albuterol (PROVENTIL HFA;VENTOLIN HFA) 108 (90 Base) MCG/ACT inhaler 2 puff (2 puffs Inhalation Given 07/30/18 2015)  predniSONE (DELTASONE) tablet 60 mg (60 mg Oral Given 07/30/18 2013)  benzonatate (TESSALON) capsule 100 mg (100 mg Oral Given 07/30/18 2012)  sodium chloride 0.9 % bolus 1,000 mL (0 mLs Intravenous Stopped 07/30/18 2250)  potassium chloride SA (K-DUR,KLOR-CON) CR tablet 40 mEq (40 mEq Oral Given 07/30/18 2137)  potassium chloride 10 mEq in 100 mL IVPB (0 mEq Intravenous Stopped 07/30/18 2258)     Initial Impression / Assessment and Plan / ED Course  I have reviewed the triage vital signs and the nursing notes.  Pertinent labs & imaging results that were available during my care of the patient were reviewed by me and considered in my medical decision making (see chart for details).     BP 126/87   Pulse 74   Temp 99.1 F (37.3 C) (Oral)   Resp 19   Ht 6' (1.829 m)   Wt 88.5 kg   SpO2 93%   BMI 26.45 kg/m    Final Clinical Impressions(s) / ED Diagnoses   Final diagnoses:  Atypical chest pain    ED  Discharge Orders         Ordered    benzonatate (TESSALON) 200 MG capsule  2 times daily PRN     07/30/18 2318    predniSONE (DELTASONE) 20 MG tablet     07/30/18 2318    potassium chloride SA (K-DUR,KLOR-CON) 20 MEQ tablet  Daily     07/30/18 2318         7:57 PM Patient with symptoms consistent with bronchitis.  Chest x-ray without focal infiltrate concerning for pneumonia.  He does admit to using cocaine several days prior, EKG is abnormal but unchanged from prior, initial troponin is normal will obtain delta troponin.  Patient given steroid, cough medication, and albuterol inhaler.  9:16 PM Lab is remarkable for a potassium of 2.8, will give supplementation.  His creatinine is 1.41 which is showing signs of dehydration, IV fluid given.  He does have an elevated white count of 18.4.  I am unsure why he has this leukocytosis.  He denies any recent prednisone use.  He denies having any abdominal pain or dysuria at this time.  While in the room, his O2 sats is around 93% on room air.  Will have patient ambulate while checking O2, d-dimer ordered.  11:13 PM Delta trop within normal limit.  D-dimer is negative.  Pt felt better with current treatment.  Will d/c home with potassium supplementation, along with steroid, tessalon and albuterol inhaler.  Return precaution discussed.  Recommend cocaine cessation.    Fayrene Helper, PA-C 07/30/18 2320    Tilden Fossa, MD 07/31/18 1538

## 2018-11-23 ENCOUNTER — Other Ambulatory Visit: Payer: Self-pay

## 2018-11-23 ENCOUNTER — Encounter (HOSPITAL_COMMUNITY): Payer: Self-pay

## 2018-11-23 ENCOUNTER — Emergency Department (HOSPITAL_COMMUNITY)
Admission: EM | Admit: 2018-11-23 | Discharge: 2018-11-23 | Disposition: A | Discharge status: Home / Self Care | Payer: BLUE CROSS/BLUE SHIELD | Source: Home / Self Care | Attending: Emergency Medicine | Admitting: Emergency Medicine

## 2018-11-23 DIAGNOSIS — I252 Old myocardial infarction: Secondary | ICD-10-CM

## 2018-11-23 DIAGNOSIS — Z79899 Other long term (current) drug therapy: Secondary | ICD-10-CM

## 2018-11-23 DIAGNOSIS — R591 Generalized enlarged lymph nodes: Secondary | ICD-10-CM

## 2018-11-23 DIAGNOSIS — I10 Essential (primary) hypertension: Secondary | ICD-10-CM

## 2018-11-23 DIAGNOSIS — J45909 Unspecified asthma, uncomplicated: Secondary | ICD-10-CM

## 2018-11-23 DIAGNOSIS — R0789 Other chest pain: Secondary | ICD-10-CM | POA: Diagnosis not present

## 2018-11-23 DIAGNOSIS — J3489 Other specified disorders of nose and nasal sinuses: Secondary | ICD-10-CM

## 2018-11-23 DIAGNOSIS — R0981 Nasal congestion: Secondary | ICD-10-CM

## 2018-11-23 DIAGNOSIS — F17210 Nicotine dependence, cigarettes, uncomplicated: Secondary | ICD-10-CM

## 2018-11-23 DIAGNOSIS — I7101 Dissection of thoracic aorta: Secondary | ICD-10-CM | POA: Diagnosis not present

## 2018-11-23 LAB — GROUP A STREP BY PCR: Group A Strep by PCR: NOT DETECTED

## 2018-11-23 MED ORDER — LORATADINE 10 MG PO TABS: 10.0000 mg | ORAL_TABLET | Freq: Every day | ORAL | 0 refills | Status: DC

## 2018-11-23 MED ORDER — FLUTICASONE PROPIONATE 50 MCG/ACT NA SUSP: 2.0000 | Freq: Every day | NASAL | 0 refills | Status: DC

## 2018-11-23 NOTE — ED Triage Notes (Addendum)
Patient c/o sore throat x 2 days and swollen left lymph glands.  Patient states he has BP meds,but has not taken them today.

## 2018-11-23 NOTE — Discharge Instructions (Signed)
Please take medications as prescribed.   Please call the ear nose and throat doctor to make an appointment for follow-up.  Please also call your regular doctor to make an appointment for follow-up in the next 1 to 2 weeks.  Return to the ER for new or worsening symptoms.

## 2018-11-23 NOTE — ED Provider Notes (Signed)
[facility]-EMERGENCY DEPT Provider Note   CSN: [id number] Arrival date & time: 11/23/18  1403     History   Chief Complaint Chief Complaint  Patient presents with  . Sore Throat  . Lymphadenopathy    HPI LAWAYNE FRAGOSO is a 43 y.o. male.  HPI   Patient is a 43 year old male with a history of bronchitis, hypertension, and STEMI, who presents to the emergency department today for evaluation of lymphadenopathy that he noticed several days ago.  States he notices more swelling on the left than the right.  He denies a sore throat.  He does report that he has had nasal congestion, and runny nose for several months that he attributes to having "a cocaine problem ".  He states he no longer uses.  He denies any fevers.  Past Medical History:  Diagnosis Date  . Bronchitis   . Hypertension   . NSTEMI (non-ST elevated myocardial infarction) [facility])     Patient Active Problem List   Diagnosis Date Noted  . Polysubstance abuse (HCC) 04/07/2017  . Tobacco abuse 04/07/2017  . NSTEMI (non-ST elevated myocardial infarction) (HCC) 04/07/2017  . Hypertensive emergency 04/07/2017  . Hypokalemia 04/07/2017  . Hip pain 01/12/2014  . Sinus congestion 01/12/2014  . Asthma 01/06/2014  . HTN (hypertension) 12/30/2013  . Borderline hyperglycemia 12/30/2013  . Chronic generalized abdominal pain 12/30/2013  . Headache 12/30/2013  . Urinary frequency 12/30/2013  . Non-suicidal depressed mood 12/30/2013  . Tobacco abuse counseling 12/30/2013  . Screening for hyperlipidemia 12/30/2013    Past Surgical History:  Procedure Laterality Date  . DENTAL SURGERY          Home Medications    Prior to Admission medications   Medication Sig Start Date End Date Taking? Authorizing Provider  atorvastatin (LIPITOR) 40 MG tablet Take 1 tablet (40 mg total) by mouth daily at 6 PM. 07/16/18   Loletta Specter, PA-C  benzonatate (TESSALON) 200 MG capsule Take 1 capsule (200 mg total)  by mouth 2 (two) times daily as needed for cough. 07/30/18   Fayrene Helper, PA-C  carvedilol (COREG) 12.5 MG tablet Take 1 tablet (12.5 mg total) by mouth 2 (two) times daily with a meal. 07/16/18   Loletta Specter, PA-C  diltiazem (DILACOR XR) 240 MG 24 hr capsule Take 1 capsule (240 mg total) by mouth daily. 07/16/18   Loletta Specter, PA-C  fluticasone [facility]) 50 MCG/ACT nasal spray Place 2 sprays into both nostrils daily. 11/23/18   Levita Monical S, PA-C  Guaifenesin (MUCINEX MAXIMUM STRENGTH) 1200 MG TB12 Take 1 tablet (1,200 mg total) by mouth 2 (two) times daily. 07/16/18   Loletta Specter, PA-C  hydrochlorothiazide (HYDRODIURIL) 25 MG tablet Take 1 tablet (25 mg total) by mouth daily. 07/16/18   Loletta Specter, PA-C  lisinopril (PRINIVIL,ZESTRIL) 40 MG tablet Take 1 tablet (40 mg total) by mouth daily. 07/16/18   Loletta Specter, PA-C  loratadine (CLARITIN) 10 MG tablet Take 1 tablet (10 mg total) by mouth daily for 7 days. 11/23/18 11/30/18  Dawayne Ohair S, PA-C  naproxen (NAPROSYN) 500 MG tablet Take 1 tablet (500 mg total) by mouth 2 (two) times daily with a meal. 07/16/18   Loletta Specter, PA-C  potassium chloride SA (K-DUR,KLOR-CON) 20 MEQ tablet Take 1 tablet (20 mEq total) by mouth daily. 07/30/18   Fayrene Helper, PA-C  predniSONE (DELTASONE) 20 MG tablet 3 tabs po day one, then 2 tabs daily x 4 days  07/30/18   Fayrene Helperran, [place], PA-C    Family History Family History  Problem Relation Age of Onset  . Hypertension Mother   . Diabetes Mellitus II Mother     Social History Social History   Tobacco Use  . Smoking status: Current Every Day Smoker    Packs/day: 0.50    Types: Cigarettes  . Smokeless tobacco: Never Used  Substance Use Topics  . Alcohol use: Yes    Alcohol/week: 5.0 standard drinks    Types: 5 Cans of beer per week    Comment: occ  . Drug use: Yes    Types: Marijuana, Cocaine     Allergies   Carrot [daucus carota] and Banana   Review of  Systems Review of Systems  Constitutional: Negative for fever.  HENT: Positive for congestion and rhinorrhea. Negative for sore throat.   Eyes: Negative for visual disturbance.  Respiratory: Negative for cough and shortness of breath.   Cardiovascular: Negative for chest pain.  Gastrointestinal: Negative for abdominal pain, constipation, diarrhea, nausea and vomiting.  Genitourinary: Negative for dysuria.  Musculoskeletal: Negative for myalgias.  Skin: Negative for rash.  Neurological: Negative for headaches.  Hematological: Positive for adenopathy.     Physical Exam Updated Vital Signs BP (!) 151/100   Pulse 80   Temp 98 F (36.7 C) (Oral)   Resp 17   Ht 6' (1.829 m)   Wt 97.5 kg   SpO2 99%   BMI 29.16 kg/m   Physical Exam Vitals signs and nursing note reviewed.  Constitutional:      Appearance: He is well-developed.  HENT:     Head: Normocephalic and atraumatic.     Right Ear: Tympanic membrane normal.     Ears:     Comments: Left TM bulging. No obvious effusion or erythema.     Nose:     Comments: Nasal septum no longer intact. White/yellow tissue noted to bilat nares with swollen turbinates bilat.     Mouth/Throat:     Mouth: Mucous membranes are moist.     Pharynx: No oropharyngeal exudate or posterior oropharyngeal erythema.  Eyes:     Conjunctiva/sclera: Conjunctivae normal.  Neck:     Musculoskeletal: Neck supple.  Cardiovascular:     Rate and Rhythm: Normal rate and regular rhythm.     Heart sounds: Normal heart sounds. No murmur.  Pulmonary:     Effort: Pulmonary effort is normal. No respiratory distress.     Breath sounds: Normal breath sounds. No stridor. No wheezing or rhonchi.  Abdominal:     Palpations: Abdomen is soft.     Tenderness: There is no abdominal tenderness.  Lymphadenopathy:     Cervical: Cervical adenopathy present.  Skin:    General: Skin is warm and dry.  Neurological:     Mental Status: He is alert.  Psychiatric:         Mood and Affect: Mood normal.      ED Treatments / Results  Labs (all labs ordered are listed, but only abnormal results are displayed) Labs Reviewed  GROUP A STREP BY PCR    EKG None  Radiology No results found.  Procedures Procedures (including critical care time)  Medications Ordered in ED Medications - No data to display   Initial Impression / Assessment and Plan / ED Course  I have reviewed the triage vital signs and the nursing notes.  Pertinent labs & imaging results that were available during my care of the patient were reviewed by me  and considered in my medical decision making (see chart for details).      Final Clinical Impressions(s) / ED Diagnoses   Final diagnoses:  Lymphadenopathy  Nasal congestion   Patient presenting with complaints of lymphadenopathy that has been present for the last several days.  He denies a sore throat.  His strep test is negative.  He is afebrile.  It does appear that his nasal symptom is no longer intact and that he may have some granulation tissue in the nose.  He does have a history of cocaine use.  He is nontoxic and nonseptic appearing, I do not feel that antibiotics are indicated at this time however do feel that patient will likely need to follow-up with ENT given the findings on his nasal exam.  Have given Flonase and Claritin to help with congestion.  Have advised him to follow-up with his primary doctor as well in 1 week for reevaluation and to return to the ER for new or worsening symptoms.  He voices understanding the plan and reasons to return to the ED.  All questions answered.  ED Discharge Orders         Ordered    fluticasone (FLONASE) 50 MCG/ACT nasal spray  Daily     11/23/18 1650    loratadine (CLARITIN) 10 MG tablet  Daily     11/23/18 [id number], Anjannette Gauger S, PA-C 11/23/18 1655    Pricilla Loveless, MD 11/24/18 [phone]

## 2018-11-24 ENCOUNTER — Encounter (HOSPITAL_COMMUNITY): Payer: Self-pay | Admitting: Emergency Medicine

## 2018-11-24 ENCOUNTER — Emergency Department (HOSPITAL_COMMUNITY): Payer: BLUE CROSS/BLUE SHIELD

## 2018-11-24 ENCOUNTER — Emergency Department (HOSPITAL_COMMUNITY): Payer: BLUE CROSS/BLUE SHIELD | Admitting: Anesthesiology

## 2018-11-24 ENCOUNTER — Inpatient Hospital Stay (HOSPITAL_COMMUNITY): Payer: BLUE CROSS/BLUE SHIELD

## 2018-11-24 ENCOUNTER — Encounter (HOSPITAL_COMMUNITY)
Admission: EM | Disposition: A | Discharge status: Home / Self Care | Payer: Self-pay | Source: Home / Self Care | Attending: Thoracic Surgery (Cardiothoracic Vascular Surgery)

## 2018-11-24 ENCOUNTER — Inpatient Hospital Stay (HOSPITAL_COMMUNITY)
Admission: EM | Admit: 2018-11-24 | Discharge: 2018-11-30 | DRG: 220 | Disposition: A | Discharge status: Home / Self Care | Payer: BLUE CROSS/BLUE SHIELD | Attending: Thoracic Surgery (Cardiothoracic Vascular Surgery) | Admitting: Thoracic Surgery (Cardiothoracic Vascular Surgery)

## 2018-11-24 DIAGNOSIS — Z7151 Drug abuse counseling and surveillance of drug abuser: Secondary | ICD-10-CM

## 2018-11-24 DIAGNOSIS — I119 Hypertensive heart disease without heart failure: Secondary | ICD-10-CM | POA: Diagnosis present

## 2018-11-24 DIAGNOSIS — I7101 Dissection of thoracic aorta: Secondary | ICD-10-CM | POA: Diagnosis present

## 2018-11-24 DIAGNOSIS — F17210 Nicotine dependence, cigarettes, uncomplicated: Secondary | ICD-10-CM | POA: Diagnosis present

## 2018-11-24 DIAGNOSIS — F1410 Cocaine abuse, uncomplicated: Secondary | ICD-10-CM | POA: Diagnosis present

## 2018-11-24 DIAGNOSIS — I10 Essential (primary) hypertension: Secondary | ICD-10-CM | POA: Diagnosis not present

## 2018-11-24 DIAGNOSIS — R0789 Other chest pain: Secondary | ICD-10-CM | POA: Diagnosis present

## 2018-11-24 DIAGNOSIS — Z79899 Other long term (current) drug therapy: Secondary | ICD-10-CM

## 2018-11-24 DIAGNOSIS — D62 Acute posthemorrhagic anemia: Secondary | ICD-10-CM | POA: Diagnosis not present

## 2018-11-24 DIAGNOSIS — I252 Old myocardial infarction: Secondary | ICD-10-CM

## 2018-11-24 DIAGNOSIS — J9811 Atelectasis: Secondary | ICD-10-CM | POA: Diagnosis not present

## 2018-11-24 DIAGNOSIS — Z91018 Allergy to other foods: Secondary | ICD-10-CM

## 2018-11-24 DIAGNOSIS — Z791 Long term (current) use of non-steroidal anti-inflammatories (NSAID): Secondary | ICD-10-CM | POA: Diagnosis not present

## 2018-11-24 DIAGNOSIS — I7102 Dissection of abdominal aorta: Secondary | ICD-10-CM

## 2018-11-24 DIAGNOSIS — L299 Pruritus, unspecified: Secondary | ICD-10-CM | POA: Diagnosis not present

## 2018-11-24 DIAGNOSIS — E876 Hypokalemia: Secondary | ICD-10-CM | POA: Diagnosis not present

## 2018-11-24 DIAGNOSIS — R05 Cough: Secondary | ICD-10-CM | POA: Diagnosis not present

## 2018-11-24 DIAGNOSIS — F1290 Cannabis use, unspecified, uncomplicated: Secondary | ICD-10-CM | POA: Diagnosis present

## 2018-11-24 DIAGNOSIS — Z09 Encounter for follow-up examination after completed treatment for conditions other than malignant neoplasm: Secondary | ICD-10-CM

## 2018-11-24 DIAGNOSIS — I480 Paroxysmal atrial fibrillation: Secondary | ICD-10-CM | POA: Diagnosis not present

## 2018-11-24 DIAGNOSIS — I7100 Dissection of unspecified site of aorta: Secondary | ICD-10-CM

## 2018-11-24 DIAGNOSIS — J45909 Unspecified asthma, uncomplicated: Secondary | ICD-10-CM | POA: Diagnosis present

## 2018-11-24 DIAGNOSIS — I313 Pericardial effusion (noninflammatory): Secondary | ICD-10-CM | POA: Diagnosis present

## 2018-11-24 DIAGNOSIS — Z833 Family history of diabetes mellitus: Secondary | ICD-10-CM | POA: Diagnosis not present

## 2018-11-24 DIAGNOSIS — I71019 Dissection of thoracic aorta, unspecified: Secondary | ICD-10-CM | POA: Diagnosis present

## 2018-11-24 DIAGNOSIS — Z8249 Family history of ischemic heart disease and other diseases of the circulatory system: Secondary | ICD-10-CM

## 2018-11-24 DIAGNOSIS — Z7951 Long term (current) use of inhaled steroids: Secondary | ICD-10-CM

## 2018-11-24 DIAGNOSIS — Z4682 Encounter for fitting and adjustment of non-vascular catheter: Secondary | ICD-10-CM

## 2018-11-24 DIAGNOSIS — Z9689 Presence of other specified functional implants: Secondary | ICD-10-CM

## 2018-11-24 DIAGNOSIS — I169 Hypertensive crisis, unspecified: Secondary | ICD-10-CM

## 2018-11-24 DIAGNOSIS — D696 Thrombocytopenia, unspecified: Secondary | ICD-10-CM | POA: Diagnosis not present

## 2018-11-24 HISTORY — PX: BENTALL PROCEDURE: SHX5058

## 2018-11-24 LAB — POCT I-STAT 7, (LYTES, BLD GAS, ICA,H+H)
Acid-base deficit: 3 mmol/L — ABNORMAL HIGH (ref 0.0–2.0)
Acid-base deficit: 3 mmol/L — ABNORMAL HIGH (ref 0.0–2.0)
Acid-base deficit: 5 mmol/L — ABNORMAL HIGH (ref 0.0–2.0)
Acid-base deficit: 6 mmol/L — ABNORMAL HIGH (ref 0.0–2.0)
Bicarbonate: 24.2 mmol/L (ref 20.0–28.0)
Bicarbonate: 24.3 mmol/L (ref 20.0–28.0)
Bicarbonate: 21.1 mmol/L (ref 20.0–28.0)
Bicarbonate: 20.6 mmol/L (ref 20.0–28.0)
CALCIUM ION: 1.1 mmol/L — AB (ref 1.15–1.40)
Calcium, Ion: 0.97 mmol/L — ABNORMAL LOW (ref 1.15–1.40)
Calcium, Ion: 1 mmol/L — ABNORMAL LOW (ref 1.15–1.40)
Calcium, Ion: 1.04 mmol/L — ABNORMAL LOW (ref 1.15–1.40)
HCT: 31 % — ABNORMAL LOW (ref 39.0–52.0)
HCT: 30 % — ABNORMAL LOW (ref 39.0–52.0)
HCT: 28 % — ABNORMAL LOW (ref 39.0–52.0)
HCT: 32 % — ABNORMAL LOW (ref 39.0–52.0)
HEMOGLOBIN: 9.5 g/dL — AB (ref 13.0–17.0)
Hemoglobin: 10.5 g/dL — ABNORMAL LOW (ref 13.0–17.0)
Hemoglobin: 10.2 g/dL — ABNORMAL LOW (ref 13.0–17.0)
Hemoglobin: 10.9 g/dL — ABNORMAL LOW (ref 13.0–17.0)
O2 Saturation: 100 %
O2 Saturation: 100 %
O2 Saturation: 96 %
O2 Saturation: 91 %
PO2 ART: 531 mmHg — AB (ref 83.0–108.0)
PO2 ART: 93 mmHg (ref 83.0–108.0)
Patient temperature: 35.5
Potassium: 3.9 mmol/L (ref 3.5–5.1)
Potassium: 6.5 mmol/L (ref 3.5–5.1)
Potassium: 5.5 mmol/L — ABNORMAL HIGH (ref 3.5–5.1)
Potassium: 4.2 mmol/L (ref 3.5–5.1)
Sodium: 137 mmol/L (ref 135–145)
Sodium: 130 mmol/L — ABNORMAL LOW (ref 135–145)
Sodium: 135 mmol/L (ref 135–145)
Sodium: 139 mmol/L (ref 135–145)
TCO2: 26 mmol/L (ref 22–32)
TCO2: 26 mmol/L (ref 22–32)
TCO2: 22 mmol/L (ref 22–32)
TCO2: 22 mmol/L (ref 22–32)
pCO2 arterial: 50.3 mmHg — ABNORMAL HIGH (ref 32.0–48.0)
pCO2 arterial: 55.4 mmHg — ABNORMAL HIGH (ref 32.0–48.0)
pCO2 arterial: 41.8 mmHg (ref 32.0–48.0)
pCO2 arterial: 39.1 mmHg (ref 32.0–48.0)
pH, Arterial: 7.29 — ABNORMAL LOW (ref 7.350–7.450)
pH, Arterial: 7.251 — ABNORMAL LOW (ref 7.350–7.450)
pH, Arterial: 7.311 — ABNORMAL LOW (ref 7.350–7.450)
pH, Arterial: 7.322 — ABNORMAL LOW (ref 7.350–7.450)
pO2, Arterial: 336 mmHg — ABNORMAL HIGH (ref 83.0–108.0)
pO2, Arterial: 60 mmHg — ABNORMAL LOW (ref 83.0–108.0)

## 2018-11-24 LAB — COMPREHENSIVE METABOLIC PANEL
ALT: 15 U/L (ref 0–44)
AST: 23 U/L (ref 15–41)
Albumin: 3.9 g/dL (ref 3.5–5.0)
Alkaline Phosphatase: 65 U/L (ref 38–126)
Anion gap: 12 (ref 5–15)
BUN: 13 mg/dL (ref 6–20)
CO2: 21 mmol/L — ABNORMAL LOW (ref 22–32)
Calcium: 9 mg/dL (ref 8.9–10.3)
Chloride: 106 mmol/L (ref 98–111)
Creatinine, Ser: 1.17 mg/dL (ref 0.61–1.24)
GFR calc Af Amer: >60 mL/min (ref >60)
GFR calc non Af Amer: >60 mL/min (ref >60)
Glucose, Bld: 120 mg/dL — ABNORMAL HIGH (ref 70–99)
POTASSIUM: 3.6 mmol/L (ref 3.5–5.1)
SODIUM: 139 mmol/L (ref 135–145)
Total Bilirubin: 0.8 mg/dL (ref 0.3–1.2)
Total Protein: 6.7 g/dL (ref 6.5–8.1)

## 2018-11-24 LAB — CBC WITH DIFFERENTIAL/PLATELET
Abs Immature Granulocytes: 0.01 10*3/uL (ref 0.00–0.07)
Basophils Absolute: 0.1 10*3/uL (ref 0.0–0.1)
Basophils Relative: 1 %
Eosinophils Absolute: 0.6 10*3/uL — ABNORMAL HIGH (ref 0.0–0.5)
Eosinophils Relative: 7 %
HCT: 43.2 % (ref 39.0–52.0)
Hemoglobin: 14.5 g/dL (ref 13.0–17.0)
Immature Granulocytes: 0 %
Lymphocytes Relative: 34 %
Lymphs Abs: 2.8 10*3/uL (ref 0.7–4.0)
MCH: 29.8 pg (ref 26.0–34.0)
MCHC: 33.6 g/dL (ref 30.0–36.0)
MCV: 88.9 fL (ref 80.0–100.0)
Monocytes Absolute: 1 10*3/uL (ref 0.1–1.0)
Monocytes Relative: 11 %
Neutro Abs: 4 10*3/uL (ref 1.7–7.7)
Neutrophils Relative %: 47 %
Platelets: 286 10*3/uL (ref 150–400)
RBC: 4.86 MIL/uL (ref 4.22–5.81)
RDW: 13.4 % (ref 11.5–15.5)
WBC: 8.5 10*3/uL (ref 4.0–10.5)
nRBC: 0 % (ref 0.0–0.2)

## 2018-11-24 LAB — POCT I-STAT 4, (NA,K, GLUC, HGB,HCT)
GLUCOSE: 104 mg/dL — AB (ref 70–99)
Glucose, Bld: 91 mg/dL (ref 70–99)
Glucose, Bld: 103 mg/dL — ABNORMAL HIGH (ref 70–99)
Glucose, Bld: 133 mg/dL — ABNORMAL HIGH (ref 70–99)
Glucose, Bld: 158 mg/dL — ABNORMAL HIGH (ref 70–99)
Glucose, Bld: 124 mg/dL — ABNORMAL HIGH (ref 70–99)
Glucose, Bld: 113 mg/dL — ABNORMAL HIGH (ref 70–99)
HCT: 28 % — ABNORMAL LOW (ref 39.0–52.0)
HCT: 35 % — ABNORMAL LOW (ref 39.0–52.0)
HCT: 28 % — ABNORMAL LOW (ref 39.0–52.0)
HCT: 28 % — ABNORMAL LOW (ref 39.0–52.0)
HCT: 28 % — ABNORMAL LOW (ref 39.0–52.0)
HCT: 26 % — ABNORMAL LOW (ref 39.0–52.0)
HCT: 31 % — ABNORMAL LOW (ref 39.0–52.0)
HEMOGLOBIN: 9.5 g/dL — AB (ref 13.0–17.0)
Hemoglobin: 11.9 g/dL — ABNORMAL LOW (ref 13.0–17.0)
Hemoglobin: 9.5 g/dL — ABNORMAL LOW (ref 13.0–17.0)
Hemoglobin: 9.5 g/dL — ABNORMAL LOW (ref 13.0–17.0)
Hemoglobin: 9.5 g/dL — ABNORMAL LOW (ref 13.0–17.0)
Hemoglobin: 8.8 g/dL — ABNORMAL LOW (ref 13.0–17.0)
Hemoglobin: 10.5 g/dL — ABNORMAL LOW (ref 13.0–17.0)
POTASSIUM: 4.4 mmol/L (ref 3.5–5.1)
Potassium: 3.6 mmol/L (ref 3.5–5.1)
Potassium: 3.9 mmol/L (ref 3.5–5.1)
Potassium: 5 mmol/L (ref 3.5–5.1)
Potassium: 6.4 mmol/L (ref 3.5–5.1)
Potassium: 6.3 mmol/L (ref 3.5–5.1)
Potassium: 5.4 mmol/L — ABNORMAL HIGH (ref 3.5–5.1)
SODIUM: 133 mmol/L — AB (ref 135–145)
Sodium: 136 mmol/L (ref 135–145)
Sodium: 137 mmol/L (ref 135–145)
Sodium: 130 mmol/L — ABNORMAL LOW (ref 135–145)
Sodium: 131 mmol/L — ABNORMAL LOW (ref 135–145)
Sodium: 135 mmol/L (ref 135–145)
Sodium: 139 mmol/L (ref 135–145)

## 2018-11-24 LAB — HEMOGLOBIN AND HEMATOCRIT, BLOOD
HCT: 29.8 % — ABNORMAL LOW (ref 39.0–52.0)
Hemoglobin: 9.8 g/dL — ABNORMAL LOW (ref 13.0–17.0)

## 2018-11-24 LAB — PROTIME-INR
INR: 0.96
INR: 1.47
PROTHROMBIN TIME: 12.7 s (ref 11.4–15.2)
Prothrombin Time: 17.7 seconds — ABNORMAL HIGH (ref 11.4–15.2)

## 2018-11-24 LAB — GLUCOSE, CAPILLARY: GLUCOSE-CAPILLARY: 117 mg/dL — AB (ref 70–99)

## 2018-11-24 LAB — ABO/RH: ABO/RH(D): B POS

## 2018-11-24 LAB — BRAIN NATRIURETIC PEPTIDE: B Natriuretic Peptide: 28.3 pg/mL (ref 0.0–100.0)

## 2018-11-24 LAB — APTT: aPTT: 49 seconds — ABNORMAL HIGH (ref 24–36)

## 2018-11-24 LAB — CBC
HCT: 33.2 % — ABNORMAL LOW (ref 39.0–52.0)
Hemoglobin: 10.9 g/dL — ABNORMAL LOW (ref 13.0–17.0)
MCH: 29.3 pg (ref 26.0–34.0)
MCHC: 32.8 g/dL (ref 30.0–36.0)
MCV: 89.2 fL (ref 80.0–100.0)
NRBC: 0 % (ref 0.0–0.2)
Platelets: 148 10*3/uL — ABNORMAL LOW (ref 150–400)
RBC: 3.72 MIL/uL — AB (ref 4.22–5.81)
RDW: 13.4 % (ref 11.5–15.5)
WBC: 17.3 10*3/uL — ABNORMAL HIGH (ref 4.0–10.5)

## 2018-11-24 LAB — MAGNESIUM: Magnesium: 2.1 mg/dL (ref 1.7–2.4)

## 2018-11-24 LAB — PREPARE RBC (CROSSMATCH)

## 2018-11-24 LAB — ECHO TEE
Ao-asc: 3.9 cm
Ao-prox: 3.5
STJ: 4 cm
Sinus: 4.1 cm

## 2018-11-24 LAB — PLATELET COUNT: Platelets: 151 10*3/uL (ref 150–400)

## 2018-11-24 LAB — FIBRINOGEN: Fibrinogen: 307 mg/dL (ref 210–475)

## 2018-11-24 LAB — TROPONIN I: Troponin I: <0.03 ng/mL (ref <0.03)

## 2018-11-24 SURGERY — BENTALL PROCEDURE
Anesthesia: General | Site: Chest

## 2018-11-24 MED ORDER — DEXMEDETOMIDINE HCL IN NACL 400 MCG/100ML IV SOLN
0.0000 ug/kg/h | INTRAVENOUS | Status: DC
  Filled 2018-11-24: qty 100

## 2018-11-24 MED ORDER — SODIUM CHLORIDE 0.45 % IV SOLN
INTRAVENOUS | Status: DC | PRN
  Administered 2018-11-24: 20 mL/h via INTRAVENOUS
  Administered 2018-11-24: 22:00:00 via INTRAVENOUS

## 2018-11-24 MED ORDER — TRAMADOL HCL 50 MG PO TABS
50.0000 mg | ORAL_TABLET | ORAL | Status: DC | PRN
  Administered 2018-11-25 – 2018-11-27 (×5): 100 mg via ORAL
  Filled 2018-11-24 (×5): qty 2

## 2018-11-24 MED ORDER — PHENYLEPHRINE HCL-NACL 20-0.9 MG/250ML-% IV SOLN: 0.0000 ug/min | INTRAVENOUS | Status: DC

## 2018-11-24 MED ORDER — DEXMEDETOMIDINE HCL IN NACL 200 MCG/50ML IV SOLN
0.0000 ug/kg/h | INTRAVENOUS | Status: DC
  Administered 2018-11-24: 0.7 ug/kg/h via INTRAVENOUS
  Filled 2018-11-24: qty 50

## 2018-11-24 MED ORDER — POTASSIUM CHLORIDE 10 MEQ/50ML IV SOLN: 10.0000 meq | INTRAVENOUS | Status: AC

## 2018-11-24 MED ORDER — SODIUM CHLORIDE 0.9 % IV SOLN
750.0000 mg | INTRAVENOUS | Status: AC
  Administered 2018-11-24: 750 mg via INTRAVENOUS
  Filled 2018-11-24 (×3): qty 750

## 2018-11-24 MED ORDER — METOPROLOL TARTRATE 12.5 MG HALF TABLET
12.5000 mg | ORAL_TABLET | Freq: Two times a day (BID) | ORAL | Status: DC
  Administered 2018-11-25 (×2): 12.5 mg via ORAL
  Filled 2018-11-24 (×2): qty 1

## 2018-11-24 MED ORDER — INSULIN REGULAR(HUMAN) IN NACL 100-0.9 UT/100ML-% IV SOLN
INTRAVENOUS | Status: DC
  Filled 2018-11-24: qty 100

## 2018-11-24 MED ORDER — PROTAMINE SULFATE 10 MG/ML IV SOLN
INTRAVENOUS | Status: AC
  Filled 2018-11-24: qty 15

## 2018-11-24 MED ORDER — FENTANYL CITRATE (PF) 250 MCG/5ML IJ SOLN
INTRAMUSCULAR | Status: DC | PRN
  Administered 2018-11-24: 500 ug via INTRAVENOUS
  Administered 2018-11-24 (×4): 250 ug via INTRAVENOUS

## 2018-11-24 MED ORDER — MIDAZOLAM HCL 2 MG/2ML IJ SOLN: 2.0000 mg | INTRAMUSCULAR | Status: DC | PRN

## 2018-11-24 MED ORDER — ETOMIDATE 2 MG/ML IV SOLN
INTRAVENOUS | Status: DC | PRN
  Administered 2018-11-24 (×2): 10 mg via INTRAVENOUS

## 2018-11-24 MED ORDER — SODIUM CHLORIDE 0.9 % IV SOLN
INTRAVENOUS | Status: DC
  Filled 2018-11-24: qty 30

## 2018-11-24 MED ORDER — PROTAMINE SULFATE 10 MG/ML IV SOLN
INTRAVENOUS | Status: DC | PRN
  Administered 2018-11-24: 25 mg via INTRAVENOUS
  Administered 2018-11-24 (×2): 10 mg via INTRAVENOUS
  Administered 2018-11-24: 330 mg via INTRAVENOUS

## 2018-11-24 MED ORDER — SODIUM CHLORIDE 0.9 % IV SOLN
INTRAVENOUS | Status: DC | PRN
  Administered 2018-11-24: 20 ug/min via INTRAVENOUS

## 2018-11-24 MED ORDER — TRANEXAMIC ACID 1000 MG/10ML IV SOLN
1.5000 mg/kg/h | INTRAVENOUS | Status: AC
  Administered 2018-11-24: 1.5 mg/kg/h via INTRAVENOUS
  Filled 2018-11-24: qty 25

## 2018-11-24 MED ORDER — ACETAMINOPHEN 650 MG RE SUPP: 650.0000 mg | Freq: Once | RECTAL | Status: AC

## 2018-11-24 MED ORDER — TRANEXAMIC ACID (OHS) PUMP PRIME SOLUTION
2.0000 mg/kg | INTRAVENOUS | Status: DC
  Filled 2018-11-24: qty 1.95

## 2018-11-24 MED ORDER — ACETAMINOPHEN 500 MG PO TABS
1000.0000 mg | ORAL_TABLET | Freq: Four times a day (QID) | ORAL | Status: AC
  Administered 2018-11-25 – 2018-11-29 (×17): 1000 mg via ORAL
  Filled 2018-11-24 (×17): qty 2

## 2018-11-24 MED ORDER — POTASSIUM CHLORIDE 2 MEQ/ML IV SOLN
80.0000 meq | INTRAVENOUS | Status: DC
  Filled 2018-11-24: qty 40

## 2018-11-24 MED ORDER — DEXMEDETOMIDINE HCL IN NACL 400 MCG/100ML IV SOLN
0.1000 ug/kg/h | INTRAVENOUS | Status: AC
  Administered 2018-11-24 (×2): 0.7 ug/kg/h via INTRAVENOUS
  Filled 2018-11-24: qty 100

## 2018-11-24 MED ORDER — CHLORHEXIDINE GLUCONATE CLOTH 2 % EX PADS
6.0000 | MEDICATED_PAD | Freq: Once | CUTANEOUS | Status: AC
  Administered 2018-11-24: 6 via TOPICAL

## 2018-11-24 MED ORDER — ALBUMIN HUMAN 5 % IV SOLN
INTRAVENOUS | Status: DC | PRN
  Administered 2018-11-24 (×3): via INTRAVENOUS

## 2018-11-24 MED ORDER — AMIODARONE LOAD VIA INFUSION
150.0000 mg | Freq: Once | INTRAVENOUS | Status: AC
  Administered 2018-11-24: 150 mg via INTRAVENOUS
  Filled 2018-11-24: qty 83.34

## 2018-11-24 MED ORDER — ASPIRIN 81 MG PO CHEW: 324.0000 mg | CHEWABLE_TABLET | Freq: Every day | ORAL | Status: DC

## 2018-11-24 MED ORDER — ASPIRIN EC 325 MG PO TBEC
325.0000 mg | DELAYED_RELEASE_TABLET | Freq: Every day | ORAL | Status: DC
  Administered 2018-11-25 – 2018-11-30 (×6): 325 mg via ORAL
  Filled 2018-11-24 (×6): qty 1

## 2018-11-24 MED ORDER — ACETAMINOPHEN 160 MG/5ML PO SOLN
650.0000 mg | Freq: Once | ORAL | Status: AC
  Administered 2018-11-24: 650 mg

## 2018-11-24 MED ORDER — SODIUM CHLORIDE 0.9 % IV SOLN
1.5000 g | Freq: Two times a day (BID) | INTRAVENOUS | Status: AC
  Administered 2018-11-24 – 2018-11-26 (×4): 1.5 g via INTRAVENOUS
  Filled 2018-11-24 (×4): qty 1.5

## 2018-11-24 MED ORDER — PLASMA-LYTE 148 IV SOLN
INTRAVENOUS | Status: AC
  Administered 2018-11-24: 500 mL
  Filled 2018-11-24: qty 2.5

## 2018-11-24 MED ORDER — AMIODARONE HCL IN DEXTROSE 360-4.14 MG/200ML-% IV SOLN
30.0000 mg/h | INTRAVENOUS | Status: DC
  Administered 2018-11-24 – 2018-11-26 (×4): 30 mg/h via INTRAVENOUS
  Filled 2018-11-24 (×5): qty 200

## 2018-11-24 MED ORDER — SODIUM CHLORIDE 0.9% FLUSH
3.0000 mL | Freq: Two times a day (BID) | INTRAVENOUS | Status: DC
  Administered 2018-11-26 – 2018-11-27 (×3): 3 mL via INTRAVENOUS

## 2018-11-24 MED ORDER — SODIUM CHLORIDE 0.9 % IV SOLN
INTRAVENOUS | Status: DC | PRN
  Administered 2018-11-24: 20:00:00 via INTRAVENOUS

## 2018-11-24 MED ORDER — IOPAMIDOL (ISOVUE-370) INJECTION 76%
INTRAVENOUS | Status: AC
  Administered 2018-11-24: 13:00:00
  Filled 2018-11-24: qty 100

## 2018-11-24 MED ORDER — VANCOMYCIN HCL 10 G IV SOLR
1250.0000 mg | INTRAVENOUS | Status: AC
  Administered 2018-11-24: 1250 mg via INTRAVENOUS
  Filled 2018-11-24 (×2): qty 1250

## 2018-11-24 MED ORDER — INSULIN REGULAR(HUMAN) IN NACL 100-0.9 UT/100ML-% IV SOLN: INTRAVENOUS | Status: DC

## 2018-11-24 MED ORDER — PHENYLEPHRINE HCL 10 MG/ML IJ SOLN
INTRAMUSCULAR | Status: DC | PRN
  Administered 2018-11-24: 120 ug via INTRAVENOUS
  Administered 2018-11-24: 40 ug via INTRAVENOUS

## 2018-11-24 MED ORDER — SODIUM CHLORIDE 0.9 % IR SOLN
Status: DC | PRN
  Administered 2018-11-24: 5000 mL

## 2018-11-24 MED ORDER — ALBUMIN HUMAN 5 % IV SOLN
INTRAVENOUS | Status: AC
  Administered 2018-11-24: 12.5 g via INTRAVENOUS
  Filled 2018-11-24: qty 500

## 2018-11-24 MED ORDER — LIDOCAINE 2% (20 MG/ML) 5 ML SYRINGE
INTRAMUSCULAR | Status: DC | PRN
  Administered 2018-11-24: 60 mg via INTRAVENOUS

## 2018-11-24 MED ORDER — LACTATED RINGERS IV SOLN
INTRAVENOUS | Status: DC | PRN
  Administered 2018-11-24 (×4): via INTRAVENOUS

## 2018-11-24 MED ORDER — DOCUSATE SODIUM 100 MG PO CAPS
200.0000 mg | ORAL_CAPSULE | Freq: Every day | ORAL | Status: DC
  Administered 2018-11-25 – 2018-11-29 (×5): 200 mg via ORAL
  Filled 2018-11-24 (×6): qty 2

## 2018-11-24 MED ORDER — NITROGLYCERIN IN D5W 200-5 MCG/ML-% IV SOLN
0.0000 ug/min | INTRAVENOUS | Status: DC
  Filled 2018-11-24: qty 250

## 2018-11-24 MED ORDER — NITROGLYCERIN IN D5W 200-5 MCG/ML-% IV SOLN
0.0000 ug/min | INTRAVENOUS | Status: DC
  Administered 2018-11-24: 5 ug/min via INTRAVENOUS
  Filled 2018-11-24: qty 250

## 2018-11-24 MED ORDER — EPINEPHRINE PF 1 MG/ML IJ SOLN
0.0000 ug/min | INTRAVENOUS | Status: DC
  Filled 2018-11-24: qty 4

## 2018-11-24 MED ORDER — DOPAMINE-DEXTROSE 3.2-5 MG/ML-% IV SOLN
0.0000 ug/kg/min | INTRAVENOUS | Status: DC
  Filled 2018-11-24: qty 250

## 2018-11-24 MED ORDER — FENTANYL CITRATE (PF) 250 MCG/5ML IJ SOLN
INTRAMUSCULAR | Status: AC
  Filled 2018-11-24: qty 5

## 2018-11-24 MED ORDER — MAGNESIUM SULFATE 4 GM/100ML IV SOLN
4.0000 g | Freq: Once | INTRAVENOUS | Status: AC
  Administered 2018-11-24: 4 g via INTRAVENOUS
  Filled 2018-11-24: qty 100

## 2018-11-24 MED ORDER — SODIUM CHLORIDE 0.9 % IV SOLN
INTRAVENOUS | Status: DC | PRN
  Administered 2018-11-24: 1.5 [IU]/h via INTRAVENOUS

## 2018-11-24 MED ORDER — LIDOCAINE 2% (20 MG/ML) 5 ML SYRINGE
INTRAMUSCULAR | Status: AC
  Filled 2018-11-24: qty 5

## 2018-11-24 MED ORDER — SODIUM CHLORIDE 0.9 % IV SOLN: INTRAVENOUS | Status: DC

## 2018-11-24 MED ORDER — MIDAZOLAM HCL (PF) 10 MG/2ML IJ SOLN
INTRAMUSCULAR | Status: AC
  Filled 2018-11-24: qty 2

## 2018-11-24 MED ORDER — INSULIN REGULAR BOLUS VIA INFUSION
0.0000 [IU] | Freq: Three times a day (TID) | INTRAVENOUS | Status: DC
  Filled 2018-11-24: qty 10

## 2018-11-24 MED ORDER — ROCURONIUM BROMIDE 10 MG/ML (PF) SYRINGE
PREFILLED_SYRINGE | INTRAVENOUS | Status: DC | PRN
  Administered 2018-11-24 (×4): 50 mg via INTRAVENOUS

## 2018-11-24 MED ORDER — SODIUM CHLORIDE 0.9 % IV SOLN
1.5000 g | INTRAVENOUS | Status: AC
  Administered 2018-11-24: 1.5 g via INTRAVENOUS
  Filled 2018-11-24 (×2): qty 1.5

## 2018-11-24 MED ORDER — MAGNESIUM SULFATE 50 % IJ SOLN
40.0000 meq | INTRAMUSCULAR | Status: DC
  Filled 2018-11-24: qty 9.85

## 2018-11-24 MED ORDER — ROCURONIUM BROMIDE 50 MG/5ML IV SOSY
PREFILLED_SYRINGE | INTRAVENOUS | Status: AC
  Filled 2018-11-24: qty 15

## 2018-11-24 MED ORDER — PROPOFOL 10 MG/ML IV BOLUS
INTRAVENOUS | Status: DC | PRN
  Administered 2018-11-24: 100 mg via INTRAVENOUS

## 2018-11-24 MED ORDER — MORPHINE SULFATE (PF) 2 MG/ML IV SOLN
1.0000 mg | INTRAVENOUS | Status: DC | PRN
  Administered 2018-11-25: 4 mg via INTRAVENOUS
  Administered 2018-11-25: 2 mg via INTRAVENOUS
  Administered 2018-11-25 (×2): 4 mg via INTRAVENOUS
  Administered 2018-11-25: 2 mg via INTRAVENOUS
  Administered 2018-11-25: 4 mg via INTRAVENOUS
  Filled 2018-11-24: qty 2
  Filled 2018-11-24 (×2): qty 1
  Filled 2018-11-24 (×3): qty 2

## 2018-11-24 MED ORDER — NOREPINEPHRINE 4 MG/250ML-% IV SOLN
0.0000 ug/min | INTRAVENOUS | Status: AC
  Filled 2018-11-24: qty 250

## 2018-11-24 MED ORDER — HYDROMORPHONE HCL 1 MG/ML IJ SOLN
1.0000 mg | INTRAMUSCULAR | Status: DC | PRN
  Administered 2018-11-24: 1 mg via INTRAVENOUS
  Filled 2018-11-24: qty 1

## 2018-11-24 MED ORDER — LABETALOL HCL 5 MG/ML IV SOLN
10.0000 mg | Freq: Once | INTRAVENOUS | Status: AC
  Administered 2018-11-24: 10 mg via INTRAVENOUS

## 2018-11-24 MED ORDER — LABETALOL HCL 5 MG/ML IV SOLN
INTRAVENOUS | Status: AC
  Filled 2018-11-24: qty 4

## 2018-11-24 MED ORDER — ACETAMINOPHEN 160 MG/5ML PO SOLN
1000.0000 mg | Freq: Four times a day (QID) | ORAL | Status: AC
  Administered 2018-11-24: 1000 mg
  Filled 2018-11-24: qty 40.6

## 2018-11-24 MED ORDER — LACTATED RINGERS IV SOLN: 500.0000 mL | Freq: Once | INTRAVENOUS | Status: DC | PRN

## 2018-11-24 MED ORDER — METOPROLOL TARTRATE 25 MG/10 ML ORAL SUSPENSION: 12.5000 mg | Freq: Two times a day (BID) | ORAL | Status: DC

## 2018-11-24 MED ORDER — TRANEXAMIC ACID (OHS) BOLUS VIA INFUSION
15.0000 mg/kg | INTRAVENOUS | Status: AC
  Administered 2018-11-24: 1462.5 mg via INTRAVENOUS
  Filled 2018-11-24: qty 1463

## 2018-11-24 MED ORDER — CHLORHEXIDINE GLUCONATE 0.12 % MT SOLN
15.0000 mL | OROMUCOSAL | Status: AC
  Administered 2018-11-24: 15 mL via OROMUCOSAL

## 2018-11-24 MED ORDER — LACTATED RINGERS IV SOLN: INTRAVENOUS | Status: DC

## 2018-11-24 MED ORDER — OXYCODONE HCL 5 MG PO TABS
5.0000 mg | ORAL_TABLET | ORAL | Status: DC | PRN
  Administered 2018-11-25 – 2018-11-27 (×8): 10 mg via ORAL
  Filled 2018-11-24 (×9): qty 2

## 2018-11-24 MED ORDER — ASPIRIN 81 MG PO CHEW
324.0000 mg | CHEWABLE_TABLET | Freq: Once | ORAL | Status: DC
  Filled 2018-11-24: qty 4

## 2018-11-24 MED ORDER — METOPROLOL TARTRATE 5 MG/5ML IV SOLN
2.5000 mg | INTRAVENOUS | Status: DC | PRN
  Administered 2018-11-25 (×2): 5 mg via INTRAVENOUS
  Filled 2018-11-24 (×2): qty 5

## 2018-11-24 MED ORDER — SODIUM CHLORIDE 0.9 % IV SOLN: 250.0000 mL | INTRAVENOUS | Status: DC

## 2018-11-24 MED ORDER — BISACODYL 5 MG PO TBEC
10.0000 mg | DELAYED_RELEASE_TABLET | Freq: Every day | ORAL | Status: DC
  Administered 2018-11-25 – 2018-11-29 (×4): 10 mg via ORAL
  Filled 2018-11-24 (×4): qty 2

## 2018-11-24 MED ORDER — FAMOTIDINE IN NACL 20-0.9 MG/50ML-% IV SOLN
20.0000 mg | Freq: Two times a day (BID) | INTRAVENOUS | Status: AC
  Administered 2018-11-24 – 2018-11-25 (×2): 20 mg via INTRAVENOUS
  Filled 2018-11-24: qty 50

## 2018-11-24 MED ORDER — HYDROCORTISONE NA SUCCINATE PF 1000 MG IJ SOLR
INTRAMUSCULAR | Status: DC | PRN
  Administered 2018-11-24: 125 mg via INTRAVENOUS

## 2018-11-24 MED ORDER — VANCOMYCIN HCL IN DEXTROSE 1-5 GM/200ML-% IV SOLN
1000.0000 mg | Freq: Once | INTRAVENOUS | Status: AC
  Administered 2018-11-25: 1000 mg via INTRAVENOUS
  Filled 2018-11-24: qty 200

## 2018-11-24 MED ORDER — ALUM & MAG HYDROXIDE-SIMETH 200-200-20 MG/5ML PO SUSP: 30.0000 mL | Freq: Once | ORAL | Status: DC

## 2018-11-24 MED ORDER — MIDAZOLAM HCL (PF) 5 MG/ML IJ SOLN
INTRAMUSCULAR | Status: DC | PRN
  Administered 2018-11-24: 2 mg via INTRAVENOUS
  Administered 2018-11-24: 5 mg via INTRAVENOUS

## 2018-11-24 MED ORDER — PHENYLEPHRINE 40 MCG/ML (10ML) SYRINGE FOR IV PUSH (FOR BLOOD PRESSURE SUPPORT)
PREFILLED_SYRINGE | INTRAVENOUS | Status: AC
  Filled 2018-11-24: qty 10

## 2018-11-24 MED ORDER — SODIUM CHLORIDE 0.9 % IV SOLN
INTRAVENOUS | Status: DC | PRN
  Administered 2018-11-24: 50 ug/min via INTRAVENOUS

## 2018-11-24 MED ORDER — PROTAMINE SULFATE 10 MG/ML IV SOLN
INTRAVENOUS | Status: AC
  Filled 2018-11-24: qty 50

## 2018-11-24 MED ORDER — HEPARIN SODIUM (PORCINE) 1000 UNIT/ML IJ SOLN
INTRAMUSCULAR | Status: DC | PRN
  Administered 2018-11-24: 30000 [IU] via INTRAVENOUS
  Administered 2018-11-24: 5000 [IU] via INTRAVENOUS

## 2018-11-24 MED ORDER — SODIUM CHLORIDE 0.9% FLUSH
3.0000 mL | INTRAVENOUS | Status: DC | PRN
  Administered 2018-11-26: 3 mL via INTRAVENOUS
  Filled 2018-11-24: qty 3

## 2018-11-24 MED ORDER — AMIODARONE HCL IN DEXTROSE 360-4.14 MG/200ML-% IV SOLN
60.0000 mg/h | INTRAVENOUS | Status: AC
  Administered 2018-11-24: 60 mg/h via INTRAVENOUS
  Filled 2018-11-24: qty 200

## 2018-11-24 MED ORDER — FENTANYL CITRATE (PF) 250 MCG/5ML IJ SOLN
INTRAMUSCULAR | Status: AC
  Filled 2018-11-24: qty 20

## 2018-11-24 MED ORDER — CHLORHEXIDINE GLUCONATE 0.12 % MT SOLN
15.0000 mL | Freq: Once | OROMUCOSAL | Status: AC
  Administered 2018-11-25: 15 mL via OROMUCOSAL

## 2018-11-24 MED ORDER — ALBUMIN HUMAN 5 % IV SOLN
250.0000 mL | INTRAVENOUS | Status: AC | PRN
  Administered 2018-11-24 (×4): 12.5 g via INTRAVENOUS

## 2018-11-24 MED ORDER — PHENYLEPHRINE HCL-NACL 20-0.9 MG/250ML-% IV SOLN
30.0000 ug/min | INTRAVENOUS | Status: DC
  Filled 2018-11-24: qty 250

## 2018-11-24 MED ORDER — PANTOPRAZOLE SODIUM 40 MG PO TBEC
40.0000 mg | DELAYED_RELEASE_TABLET | Freq: Every day | ORAL | Status: DC
  Administered 2018-11-26 – 2018-11-27 (×2): 40 mg via ORAL
  Filled 2018-11-24 (×2): qty 1

## 2018-11-24 MED ORDER — ONDANSETRON HCL 4 MG/2ML IJ SOLN: 4.0000 mg | Freq: Four times a day (QID) | INTRAMUSCULAR | Status: DC | PRN

## 2018-11-24 MED ORDER — BISACODYL 10 MG RE SUPP: 10.0000 mg | Freq: Every day | RECTAL | Status: DC

## 2018-11-24 MED ORDER — DEXAMETHASONE SODIUM PHOSPHATE 10 MG/ML IJ SOLN
INTRAMUSCULAR | Status: AC
  Filled 2018-11-24: qty 1

## 2018-11-24 MED ORDER — ONDANSETRON HCL 4 MG/2ML IJ SOLN
INTRAMUSCULAR | Status: AC
  Filled 2018-11-24: qty 2

## 2018-11-24 MED ORDER — HEMOSTATIC AGENTS (NO CHARGE) OPTIME
TOPICAL | Status: DC | PRN
  Administered 2018-11-24 (×3): 1 via TOPICAL
  Administered 2018-11-24: 2 via TOPICAL

## 2018-11-24 MED ORDER — NITROGLYCERIN IN D5W 200-5 MCG/ML-% IV SOLN
2.0000 ug/min | INTRAVENOUS | Status: AC
  Administered 2018-11-24: 50 ug/min via INTRAVENOUS
  Filled 2018-11-24: qty 250

## 2018-11-24 MED ORDER — MILRINONE LACTATE IN DEXTROSE 20-5 MG/100ML-% IV SOLN
0.3000 ug/kg/min | INTRAVENOUS | Status: DC
  Filled 2018-11-24: qty 100

## 2018-11-24 SURGICAL SUPPLY — 96 items
ADAPTER CARDIO PERF ANTE/RETRO (ADAPTER) ×4 IMPLANT
APPLICATOR COTTON TIP 6IN STRL (MISCELLANEOUS) IMPLANT
APPLICATOR TIP BIOGLUE STANDRD (MISCELLANEOUS) ×4 IMPLANT
BAG DECANTER FOR FLEXI CONT (MISCELLANEOUS) ×2 IMPLANT
BLADE STERNUM SYSTEM 6 (BLADE) ×2 IMPLANT
BLADE SURG 15 STRL LF DISP TIS (BLADE) ×1 IMPLANT
BLADE SURG 15 STRL SS (BLADE) ×1
CANISTER SUCT 3000ML PPV (MISCELLANEOUS) ×2 IMPLANT
CANNULA GUNDRY RCSP 15FR (MISCELLANEOUS) ×4 IMPLANT
CATH HEART VENT LEFT (CATHETERS) ×1 IMPLANT
CATH ROBINSON RED A/P 18FR (CATHETERS) ×4 IMPLANT
CAUTERY EYE LOW TEMP 1300F FIN (OPHTHALMIC RELATED) ×2 IMPLANT
CAUTERY SURG HI TEMP FINE TIP (MISCELLANEOUS) ×2 IMPLANT
CLIP FOGARTY SPRING 6M (CLIP) IMPLANT
CONT SPEC 4OZ CLIKSEAL STRL BL (MISCELLANEOUS) ×2 IMPLANT
COVER WAND RF STERILE (DRAPES) ×2 IMPLANT
CRADLE DONUT ADULT HEAD (MISCELLANEOUS) ×2 IMPLANT
DRAPE SLUSH/WARMER DISC (DRAPES) IMPLANT
DRSG AQUACEL AG ADV 3.5X14 (GAUZE/BANDAGES/DRESSINGS) ×2 IMPLANT
DRSG COVADERM 4X14 (GAUZE/BANDAGES/DRESSINGS) ×2 IMPLANT
ELECT REM PT RETURN 9FT ADLT (ELECTROSURGICAL) ×4
ELECTRODE REM PT RTRN 9FT ADLT (ELECTROSURGICAL) ×2 IMPLANT
FELT TEFLON 1X6 (MISCELLANEOUS) ×2 IMPLANT
FELT TEFLON 6X6 (MISCELLANEOUS) ×2 IMPLANT
GAUZE SPONGE 4X4 12PLY STRL (GAUZE/BANDAGES/DRESSINGS) ×4 IMPLANT
GAUZE SPONGE 4X4 12PLY STRL LF (GAUZE/BANDAGES/DRESSINGS) ×4 IMPLANT
GLOVE SURG SIGNA 7.5 PF LTX (GLOVE) ×6 IMPLANT
GOWN STRL REUS W/ TWL LRG LVL3 (GOWN DISPOSABLE) ×4 IMPLANT
GOWN STRL REUS W/TWL LRG LVL3 (GOWN DISPOSABLE) ×4
GRAFT 4 BRANCH 30X50 (Prosthesis & Implant Heart) ×2 IMPLANT
GRAFT VASC STRETCH 8X40 (Graft) ×2 IMPLANT
HEMOSTAT POWDER SURGIFOAM 1G (HEMOSTASIS) ×6 IMPLANT
HEMOSTAT SURGICEL 2X14 (HEMOSTASIS) IMPLANT
INSERT FOGARTY SM (MISCELLANEOUS) ×4 IMPLANT
INSERT FOGARTY XLG (MISCELLANEOUS) ×2 IMPLANT
KIT BASIN OR (CUSTOM PROCEDURE TRAY) ×2 IMPLANT
KIT SUCTION CATH 14FR (SUCTIONS) ×4 IMPLANT
KIT TURNOVER KIT B (KITS) ×2 IMPLANT
LINE VENT (MISCELLANEOUS) ×2 IMPLANT
LOOP VESSEL MAXI BLUE (MISCELLANEOUS) ×2 IMPLANT
NEEDLE AORTIC AIR ASPIRATING (NEEDLE) ×2 IMPLANT
NS IRRIG 1000ML POUR BTL (IV SOLUTION) ×8 IMPLANT
PACK OPEN HEART (CUSTOM PROCEDURE TRAY) ×2 IMPLANT
PAD ARMBOARD 7.5X6 YLW CONV (MISCELLANEOUS) ×4 IMPLANT
RELOAD TRI 2.0 30 VAS MED SUL (STAPLE) ×2 IMPLANT
SEALANT PATCH FIBRIN 2X4IN (MISCELLANEOUS) ×4 IMPLANT
SEALANT SURG COSEAL 8ML (VASCULAR PRODUCTS) ×2 IMPLANT
SENSOR MYOCARDIAL TEMP (MISCELLANEOUS) ×2 IMPLANT
SET CARDIOPLEGIA MPS 5001102 (MISCELLANEOUS) ×2 IMPLANT
STAPLER ENDO GIA 12MM SHORT (STAPLE) ×2 IMPLANT
SUT ETHIBON 2 0 V 52N 30 (SUTURE) ×4 IMPLANT
SUT ETHIBON EXCEL 2-0 V-5 (SUTURE) IMPLANT
SUT ETHIBOND 2 0 SH (SUTURE) ×1
SUT ETHIBOND 2 0 SH 36X2 (SUTURE) ×1 IMPLANT
SUT ETHIBOND 2 0 V4 (SUTURE) IMPLANT
SUT ETHIBOND 2 0V4 GREEN (SUTURE) IMPLANT
SUT ETHIBOND 4 0 RB 1 (SUTURE) IMPLANT
SUT ETHIBOND V-5 VALVE (SUTURE) IMPLANT
SUT PROLENE 3 0 SH 1 (SUTURE) ×4 IMPLANT
SUT PROLENE 3 0 SH DA (SUTURE) ×4 IMPLANT
SUT PROLENE 4 0 RB 1 (SUTURE) ×12
SUT PROLENE 4 0 SH DA (SUTURE) ×20 IMPLANT
SUT PROLENE 4-0 RB1 .5 CRCL 36 (SUTURE) ×12 IMPLANT
SUT PROLENE 5 0 C 1 24 (SUTURE) ×4 IMPLANT
SUT PROLENE 5 0 C 1 36 (SUTURE) ×4 IMPLANT
SUT PROLENE 6 0 C 1 30 (SUTURE) ×4 IMPLANT
SUT PROLENE 7 0 BV 1 (SUTURE) ×2 IMPLANT
SUT SILK  1 MH (SUTURE) ×2
SUT SILK 1 MH (SUTURE) ×2 IMPLANT
SUT SILK 1 TIES 10X30 (SUTURE) ×2 IMPLANT
SUT SILK 2 0 (SUTURE) ×1
SUT SILK 2 0 SH CR/8 (SUTURE) ×4 IMPLANT
SUT SILK 2-0 18XBRD TIE 12 (SUTURE) ×1 IMPLANT
SUT SILK 3 0 SH CR/8 (SUTURE) ×2 IMPLANT
SUT SILK 4 0 (SUTURE) ×1
SUT SILK 4-0 18XBRD TIE 12 (SUTURE) ×1 IMPLANT
SUT STEEL 6MS V (SUTURE) IMPLANT
SUT TEM PAC WIRE 2 0 SH (SUTURE) ×8 IMPLANT
SUT VIC AB 1 CTX 27 (SUTURE) ×4 IMPLANT
SUT VIC AB 1 CTX 36 (SUTURE) ×2
SUT VIC AB 1 CTX36XBRD ANBCTR (SUTURE) ×2 IMPLANT
SUT VIC AB 2-0 CT1 27 (SUTURE) ×1
SUT VIC AB 2-0 CT1 TAPERPNT 27 (SUTURE) ×1 IMPLANT
SUT VIC AB 2-0 CTX 27 (SUTURE) ×4 IMPLANT
SUT VIC AB 3-0 SH 27 (SUTURE) ×1
SUT VIC AB 3-0 SH 27X BRD (SUTURE) ×1 IMPLANT
SUT VIC AB 3-0 X1 27 (SUTURE) ×4 IMPLANT
SYR 10ML KIT SKIN ADHESIVE (MISCELLANEOUS) ×4 IMPLANT
SYSTEM SAHARA CHEST DRAIN ATS (WOUND CARE) ×2 IMPLANT
TAPE CLOTH SURG 4X10 WHT LF (GAUZE/BANDAGES/DRESSINGS) ×4 IMPLANT
TOWEL GREEN STERILE (TOWEL DISPOSABLE) ×2 IMPLANT
TOWEL GREEN STERILE FF (TOWEL DISPOSABLE) ×2 IMPLANT
TRAY FOLEY SLVR 14FR TEMP STAT (SET/KITS/TRAYS/PACK) ×2 IMPLANT
UNDERPAD 30X30 (UNDERPADS AND DIAPERS) ×2 IMPLANT
VENT LEFT HEART 12002 (CATHETERS) ×2
WATER STERILE IRR 1000ML POUR (IV SOLUTION) ×4 IMPLANT

## 2018-11-24 NOTE — Brief Op Note (Signed)
11/24/2018  9:24 PM  PATIENT:  Stephen West  43 y.o. male  PRE-OPERATIVE DIAGNOSIS:  CHEST PAIN  POST-OPERATIVE DIAGNOSIS:  Aortic Dissection   PROCEDURE: HEMIARCH REPAIR OF TYPE I AORTIC DISSECTION USING 30 mm HEMASHIELD GRAFT WITH MODERATE HYPOTHERMIC CIRCULATORY ARREST AND ANTEGRADE CEREBRAL PERFUSION  SURGEON:  Surgeon(s) and Role:    * Loreli Slot, MD - Primary  PHYSICIAN ASSISTANT: WAYNE GOLD PA-C}   ANESTHESIA:   general  EBL:  1850 mL   BLOOD ADMINISTERED:SEE ANESTH/PERFUSION RECORDS  DRAINS: PLEURAL AND PERICARDIAL CHEST TUBES   LOCAL MEDICATIONS USED:  NONE  SPECIMEN:  Source of Specimen:  AORTIC DISSECTION  DISPOSITION OF SPECIMEN:  PATHOLOGY  COUNTS:  YES  TOURNIQUET:  * No tourniquets in log *  DICTATION: .Other Dictation: Dictation Number PENDING  PLAN OF CARE: Admit to inpatient   PATIENT DISPOSITION:  ICU - intubated and hemodynamically stable.   Delay start of Pharmacological VTE agent (>24hrs) due to surgical blood loss or risk of bleeding: yes  FINDINGS Small nonclotting blood-tinged pericardial effusion Dissection extending from just above ST junction into arch, terminating adjacent to takeoff of the left subclavian.  TEE- no AI, severe LVH, preserved LV systolic function

## 2018-11-24 NOTE — Anesthesia Procedure Notes (Addendum)
Arterial Line Insertion Start/End2/01/2019 2:30 PM, 11/24/2018 2:35 PM Performed by: Leonides Grills, MD, Rogelia Boga, CRNA, anesthesiologist  Patient location: OR. Preanesthetic checklist: patient identified, IV checked, site marked, risks and benefits discussed, surgical consent, monitors and equipment checked, pre-op evaluation, timeout performed and anesthesia consent Right, radial was placed Catheter size: 20 G Hand hygiene performed , maximum sterile barriers used  and Seldinger technique used  Attempts: 1 (Previous attempts by CRNA) Procedure performed using ultrasound guided technique. Ultrasound Notes:anatomy identified, needle tip was noted to be adjacent to the nerve/plexus identified and no ultrasound evidence of intravascular and/or intraneural injection Following insertion, Biopatch and dressing applied. Post procedure assessment: normal  Patient tolerated the procedure well with no immediate complications.

## 2018-11-24 NOTE — ED Triage Notes (Signed)
Pt here from home with c/o chest pain started 30 mins ago , pt received 324 mg asa and 2 nitro , b/p high with ems

## 2018-11-24 NOTE — Anesthesia Procedure Notes (Signed)
Procedure Name: Intubation Date/Time: 11/24/2018 2:18 PM Performed by: Marena Chancy, CRNA Pre-anesthesia Checklist: Patient identified, Emergency Drugs available, Suction available and Patient being monitored Patient Re-evaluated:Patient Re-evaluated prior to induction Oxygen Delivery Method: Circle System Utilized Preoxygenation: Pre-oxygenation with 100% oxygen Induction Type: IV induction Ventilation: Mask ventilation without difficulty Laryngoscope Size: Miller and 3 Grade View: Grade I Tube type: Oral Tube size: 8.0 mm Number of attempts: 1 Airway Equipment and Method: Stylet and Oral airway Placement Confirmation: ETT inserted through vocal cords under direct vision,  positive ETCO2 and breath sounds checked- equal and bilateral Tube secured with: Tape Dental Injury: Teeth and Oropharynx as per pre-operative assessment

## 2018-11-24 NOTE — Progress Notes (Signed)
Echocardiogram Transesophageal has been performed.  Pieter Partridge 11/24/2018, 2:42 PM

## 2018-11-24 NOTE — ED Provider Notes (Signed)
[facility]   CSN: [id number] Arrival date & time: 11/24/18  0957     History   Chief Complaint Chief Complaint  Patient presents with  . Chest Pain    HPI Stephen West is a 43 y.o. male.  HPI Presents 1 day after being evaluated at our affiliated facility, for concern of sore throat, now with concern for chest pain. Patient states that his sore throat is essentially the same may be slightly better, but over the past hours he has developed sharp chest pain. Pain is focally in the upper chest, slightly on the left of center, with radiation to the jaw. There is associated dyspnea, some radiation posteriorly There is associated lightheadedness, diaphoresis, nausea, but no vomiting. Patient received nitroglycerin in route, had minimal change. Patient was initially designated as a code STEMI, this was canceled, by our colleagues prior to the patient's arrival. I discussed the patient's presentation with EMS on arrival, and with the patient himself. Patient denies interval changes since yesterday, and medication, diet, activity. He does acknowledge using cocaine within the past 5 days. He states that he takes his medication otherwise as directed.  Past Medical History:  Diagnosis Date  . Bronchitis   . Hypertension   . NSTEMI (non-ST elevated myocardial infarction) [facility](HCC)     Patient Active Problem List   Diagnosis Date Noted  . Polysubstance abuse (HCC) 04/07/2017  . Tobacco abuse 04/07/2017  . NSTEMI (non-ST elevated myocardial infarction) (HCC) 04/07/2017  . Hypertensive emergency 04/07/2017  . Hypokalemia 04/07/2017  . Hip pain 01/12/2014  . Sinus congestion 01/12/2014  . Asthma 01/06/2014  . HTN (hypertension) 12/30/2013  . Borderline hyperglycemia 12/30/2013  . Chronic generalized abdominal pain 12/30/2013  . Headache 12/30/2013  . Urinary frequency 12/30/2013  . Non-suicidal depressed mood 12/30/2013  .  Tobacco abuse counseling 12/30/2013  . Screening for hyperlipidemia 12/30/2013    Past Surgical History:  Procedure Laterality Date  . DENTAL SURGERY          Home Medications    Prior to Admission medications   Medication Sig Start Date End Date Taking? Authorizing Provider  atorvastatin (LIPITOR) 40 MG tablet Take 1 tablet (40 mg total) by mouth daily at 6 PM. 07/16/18   Loletta SpecterGomez, Roger David, PA-C  benzonatate (TESSALON) 200 MG capsule Take 1 capsule (200 mg total) by mouth 2 (two) times daily as needed for cough. 07/30/18   Fayrene Helperran, Bowie, PA-C  carvedilol (COREG) 12.5 MG tablet Take 1 tablet (12.5 mg total) by mouth 2 (two) times daily with a meal. 07/16/18   Loletta SpecterGomez, Roger David, PA-C  diltiazem (DILACOR XR) 240 MG 24 hr capsule Take 1 capsule (240 mg total) by mouth daily. 07/16/18   Loletta SpecterGomez, Roger David, PA-C  fluticasone [facility](FLONASE) 50 MCG/ACT nasal spray Place 2 sprays into both nostrils daily. 11/23/18   Couture, Cortni S, PA-C  Guaifenesin (MUCINEX MAXIMUM STRENGTH) 1200 MG TB12 Take 1 tablet (1,200 mg total) by mouth 2 (two) times daily. 07/16/18   Loletta SpecterGomez, Roger David, PA-C  hydrochlorothiazide (HYDRODIURIL) 25 MG tablet Take 1 tablet (25 mg total) by mouth daily. 07/16/18   Loletta SpecterGomez, Roger David, PA-C  lisinopril (PRINIVIL,ZESTRIL) 40 MG tablet Take 1 tablet (40 mg total) by mouth daily. 07/16/18   Loletta SpecterGomez, Roger David, PA-C  loratadine (CLARITIN) 10 MG tablet Take 1 tablet (10 mg total) by mouth daily for 7 days. 11/23/18 11/30/18  Couture, Cortni S, PA-C  naproxen (NAPROSYN) 500 MG tablet Take  1 tablet (500 mg total) by mouth 2 (two) times daily with a meal. 07/16/18   Loletta SpecterGomez, Roger David, PA-C  potassium chloride SA (K-DUR,KLOR-CON) 20 MEQ tablet Take 1 tablet (20 mEq total) by mouth daily. 07/30/18   Fayrene Helperran, Bowie, PA-C  predniSONE (DELTASONE) 20 MG tablet 3 tabs po day one, then 2 tabs daily x 4 days 07/30/18   Fayrene Helperran, Bowie, PA-C    Family History Family History  Problem Relation Age of Onset  .  Hypertension Mother   . Diabetes Mellitus II Mother     Social History Social History   Tobacco Use  . Smoking status: Current Every Day Smoker    Packs/day: 0.50    Types: Cigarettes  . Smokeless tobacco: Never Used  Substance Use Topics  . Alcohol use: Yes    Alcohol/week: 5.0 standard drinks    Types: 5 Cans of beer per week    Comment: occ  . Drug use: Yes    Types: Marijuana, Cocaine     Allergies   Carrot [daucus carota] and Banana   Review of Systems Review of Systems  Constitutional:       Per HPI, otherwise negative  HENT:       Per HPI, otherwise negative  Respiratory:       Per HPI, otherwise negative  Cardiovascular:       Per HPI, otherwise negative  Gastrointestinal: Positive for nausea. Negative for vomiting.  Endocrine:       Negative aside from HPI  Genitourinary:       Neg aside from HPI   Musculoskeletal:       Per HPI, otherwise negative  Skin: Negative.   Neurological: Positive for light-headedness. Negative for syncope.     Physical Exam Updated Vital Signs There were no vitals taken for this visit.  Physical Exam Vitals signs and nursing note reviewed.  Constitutional:      General: He is in acute distress.     Appearance: He is well-developed. He is ill-appearing and diaphoretic.  HENT:     Head: Normocephalic and atraumatic.  Eyes:     Extraocular Movements: Extraocular movements intact.     Conjunctiva/sclera: Conjunctivae normal.  Neck:     Musculoskeletal: Neck supple.  Cardiovascular:     Rate and Rhythm: Regular rhythm. Tachycardia present.  Pulmonary:     Effort: Pulmonary effort is normal.     Breath sounds: No stridor. Decreased breath sounds present.  Chest:     Chest wall: No deformity or tenderness.  Abdominal:     General: There is no distension.     Palpations: There is no mass.     Tenderness: There is no abdominal tenderness. There is no guarding or rebound.  Musculoskeletal:     Right lower leg: No  edema.     Left lower leg: No edema.  Skin:    General: Skin is warm.  Neurological:     Mental Status: He is alert and oriented to person, place, and time.  Psychiatric:     Comments: Anxious      ED Treatments / Results  Labs (all labs ordered are listed, but only abnormal results are displayed) Labs Reviewed  CBC WITH DIFFERENTIAL/PLATELET - Abnormal; Notable for the following components:      Result Value   Eosinophils Absolute 0.6 (*)    All other components within normal limits  COMPREHENSIVE METABOLIC PANEL  MAGNESIUM  TROPONIN I  BRAIN NATRIURETIC PEPTIDE  PROTIME-INR  CBG MONITORING,  ED    Reviewed the EMS rhythm strips for the patient prior to arrival, notable for hypertrophic changes, ST wave changes anteriorly. Subsequently, after the patient arrived, patient had EKG performed.  EKG EKG Interpretation  Date/Time:  Tuesday November 24 2018 09:59:57 EST Ventricular Rate:  87 PR Interval:    QRS Duration: 110 QT Interval:  371 QTC Calculation: 444 R Axis:   86 Text Interpretation:  Sinus rhythm Left ventricular hypertrophy ST-t wave abnormality Artifact Abnormal ekg Confirmed by Gerhard Munch [phone]) on 11/24/2018 10:44:33 AM   Radiology Dg Chest Portable 1 View  Result Date: 11/24/2018 CLINICAL DATA:  Shortness of breath and chest pain EXAM: PORTABLE CHEST 1 VIEW COMPARISON:  July 30, 2018 FINDINGS: The lungs are clear. Heart size and pulmonary vascularity are normal. No adenopathy. No bone lesions. No pneumothorax. IMPRESSION: No edema or consolidation. Electronically Signed   By: Bretta Bang III M.D.   On: 11/24/2018 10:35    Procedures Procedures (including critical care time)  Medications Ordered in ED Medications  aspirin chewable tablet 324 mg (324 mg Oral Not Given 11/24/18 1016)  nitroGLYCERIN 50 mg in dextrose 5 % 250 mL (0.2 mg/mL) infusion (15 mcg/min Intravenous Rate/Dose Change 11/24/18 1037)  HYDROmorphone (DILAUDID) injection 1 mg  (1 mg Intravenous Given 11/24/18 1012)     Initial Impression / Assessment and Plan / ED Course  I have reviewed the triage vital signs and the nursing notes.  Pertinent labs & imaging results that were available during my care of the patient were reviewed by me and considered in my medical decision making (see chart for details).    On arrival, the patient was diaphoretic, uncomfortable, had a systolic pressure greater than 250, diastolic greater than 140. Patient has received nitroglycerin, will initiate nitroglycerin drip, received Dilaudid for pain control, with broad differential, including acute aortic dissection, the patient will have labs, x-ray, CT scan performed.  Update:, Patient's blood pressure now improved, though diastolic remains greater than 322   Update: patient much more calm, BP remains elevated.  He now speaks clearly, continues to c/o CP / neck pain.   1:01 PM I discussed the patient's case with our radiologist, subsequently with our cardiothoracic surgeon, Dr. Dorris Fetch.  Update: Blood pressure remains improved, patient preparing for transport to the operating room for repair of his aortic dissection.  Final Clinical Impressions(s) / ED Diagnoses  Aortic dissection Hypertensive crisis  CRITICAL CARE Performed by: Gerhard Munch Total critical care time: 45 minutes Critical care time was exclusive of separately billable procedures and treating other patients. Critical care was necessary to treat or prevent imminent or life-threatening deterioration. Critical care was time spent personally by me on the following activities: development of treatment plan with patient and/or surrogate as well as nursing, discussions with consultants, evaluation of patient's response to treatment, examination of patient, obtaining history from patient or surrogate, ordering and performing treatments and interventions, ordering and review of laboratory studies, ordering and review  of radiographic studies, pulse oximetry and re-evaluation of patient's condition.    Gerhard Munch, MD 11/24/18 [phone]

## 2018-11-24 NOTE — Anesthesia Procedure Notes (Signed)
Central Venous Catheter Insertion Performed by: Murvin Natal, MD, anesthesiologist Start/End2/01/2019 2:20 PM, 11/24/2018 2:30 PM Patient location: OR. Preanesthetic checklist: patient identified, IV checked, site marked, risks and benefits discussed, surgical consent, monitors and equipment checked, pre-op evaluation, timeout performed and anesthesia consent Position: Trendelenburg Hand hygiene performed , maximum sterile barriers used  and Seldinger technique used Catheter size: 9 Fr Total catheter length 12. PA cath and Central line was placed.MAC introducer Swan type:thermodilution PA Cath depth:46 Procedure performed using ultrasound guided technique. Ultrasound Notes:anatomy identified, needle tip was noted to be adjacent to the nerve/plexus identified and no ultrasound evidence of intravascular and/or intraneural injection Attempts: 1 Following insertion, line sutured, dressing applied and Biopatch. Post procedure assessment: no air, free fluid flow and blood return through all ports  Patient tolerated the procedure well with no immediate complications.

## 2018-11-24 NOTE — Transfer of Care (Signed)
Immediate Anesthesia Transfer of Care Note  Patient: Stephen West  Procedure(s) Performed: HEMIARCH REPAIR OF TYPE ONE AORTIC DISSECTION WITH ANTIGRADE CEREBRAL PERFUSSION AND MODERATE HYPOTHERMIC CIRCULATORY ARREST. (N/A Chest)  Patient Location: ICU  Anesthesia Type:General  Level of Consciousness: sedated and Patient remains intubated per anesthesia plan  Airway & Oxygen Therapy: Patient remains intubated per anesthesia plan and Patient placed on Ventilator (see vital sign flow sheet for setting)  Post-op Assessment: Report given to RN and Post -op Vital signs reviewed and stable  Post vital signs: Reviewed and stable  Last Vitals:  Vitals Value Taken Time  BP    Temp 35.7 C 11/24/2018  9:39 PM  Pulse 88 11/24/2018  9:39 PM  Resp 18 11/24/2018  9:39 PM  SpO2 96 % 11/24/2018  9:39 PM  Vitals shown include unvalidated device data.  Last Pain:  Vitals:   11/24/18 1000  PainSc: 10-Worst pain ever         Complications: No apparent anesthesia complications

## 2018-11-24 NOTE — Anesthesia Preprocedure Evaluation (Addendum)
Anesthesia Evaluation  Patient identified by MRN, date of birth, ID band Patient awake    Reviewed: Allergy & Precautions, NPO status , Patient's Chart, lab work & pertinent test results  Airway Mallampati: II  TM Distance: >3 FB Neck ROM: Full    Dental  (+) Poor Dentition, Missing   Pulmonary asthma , Current Smoker,    Pulmonary exam normal breath sounds clear to auscultation       Cardiovascular hypertension, Pt. on home beta blockers and Pt. on medications + CAD and + Past MI  Normal cardiovascular exam Rhythm:Regular Rate:Normal  ECG: rate 87. Sinus rhythm Left ventricular hypertrophy  ECHO: Left ventricle: The cavity size was normal. There was prominent hypertrophy of apical LV segments. Systolic function was normal. The estimated ejection fraction was in the range of 55% to 60%. Wall motion was normal; there were no regional wall motion abnormalities. Features are consistent with a pseudonormal left ventricular filling pattern, with concomitant abnormal relaxation and increased filling pressure (grade 2 diastolic dysfunction). Aortic valve: There was no stenosis. Aorta: Borderline dilated aortic root. Aortic root dimension: 37 mm (ED). Mitral valve: There was no significant regurgitation. Right ventricle: The cavity size was normal. Systolic function was mildly reduced. Tricuspid valve: Peak RV-RA gradient (S): 17 mm Hg. Pulmonary arteries: PA peak pressure: 20 mm Hg (S). Inferior vena cava: The vessel was normal in size. The respirophasic diameter changes were in the normal range (>= 50%), consistent with normal central venous pressure.   Neuro/Psych  Headaches, PSYCHIATRIC DISORDERS    GI/Hepatic negative GI ROS, (+)     substance abuse  ,   Endo/Other  negative endocrine ROS  Renal/GU negative Renal ROS     Musculoskeletal negative musculoskeletal ROS (+)   Abdominal   Peds  Hematology HLD    Anesthesia Other Findings CHEST PAIN Stanford type A, Debakey type II aortic dissection.   Reproductive/Obstetrics                            Anesthesia Physical Anesthesia Plan  ASA: V and emergent  Anesthesia Plan: General   Post-op Pain Management:    Induction: Intravenous  PONV Risk Score and Plan: 1 and Midazolam and Treatment may vary due to age or medical condition  Airway Management Planned: Oral ETT  Additional Equipment: Arterial line, CVP, PA Cath, TEE and Ultrasound Guidance Line Placement  Intra-op Plan: Utilization Of Total Body Hypothermia per surgeon request and Delibrate Circulatory arrest per surgeon request  Post-operative Plan: Post-operative intubation/ventilation  Informed Consent: I have reviewed the patients History and Physical, chart, labs and discussed the procedure including the risks, benefits and alternatives for the proposed anesthesia with the patient or authorized representative who has indicated his/her understanding and acceptance.     Dental advisory given  Plan Discussed with: CRNA  Anesthesia Plan Comments:      Anesthesia Quick Evaluation

## 2018-11-24 NOTE — Anesthesia Procedure Notes (Addendum)
Arterial Line Insertion Start/End2/01/2019 2:00 PM, 11/24/2018 2:11 AM Performed by: Leonides Grills, MD, Rogelia Boga, CRNA, CRNA  Patient location: OR. Preanesthetic checklist: patient identified, IV checked, site marked, risks and benefits discussed, surgical consent, monitors and equipment checked, pre-op evaluation, timeout performed and anesthesia consent Lidocaine 1% used for infiltration Left, radial was placed Catheter size: 20 Fr Hand hygiene performed  and maximum sterile barriers used   Attempts: 1 Procedure performed without using ultrasound guided technique. Following insertion, dressing applied and Biopatch. Post procedure assessment: normal and unchanged  Patient tolerated the procedure well with no immediate complications.

## 2018-11-24 NOTE — H&P (Signed)
Stephen West is an 43 y.o. male.   Chief Complaint: chest pain HPI: [name] is a 43 yo man with a history of hypertension, NSTEMI and cocaine abuse who presents with acute onset of chest pain. Pain started spontaneously this AM. Started left neck and then shortly thereafter severe central CP. Came to ED. Severely hypertensive. CT chest shows a type A aortic dissection.  Currently still has pain but has eased off a little from the maximum. Pain with deep inspiration. No shortness of breath or abdominal pain.  Past Medical History:  Diagnosis Date  . Bronchitis   . Hypertension   . NSTEMI (non-ST elevated myocardial infarction) [facility](HCC)     Past Surgical History:  Procedure Laterality Date  . DENTAL SURGERY      Family History  Problem Relation Age of Onset  . Hypertension Mother   . Diabetes Mellitus II Mother    Social History:  reports that he has been smoking cigarettes. He has been smoking about 0.50 packs per day. He has never used smokeless tobacco. He reports current alcohol use of about 5.0 standard drinks of alcohol per week. He reports current drug use. Drugs: Marijuana and Cocaine.  Allergies:  Allergies  Allergen Reactions  . Carrot [Daucus Carota] Anaphylaxis    Unknown  . Banana Nausea And Vomiting    (Not in a hospital admission)   Results for orders placed or performed during the hospital encounter of 11/24/18 (from the past 48 hour(s))  Comprehensive metabolic panel     Status: Abnormal   Collection Time: 11/24/18 10:21 AM  Result Value Ref Range   Sodium 139 135 - 145 mmol/L   Potassium 3.6 3.5 - 5.1 mmol/L   Chloride 106 98 - 111 mmol/L   CO2 21 (L) 22 - 32 mmol/L   Glucose, Bld 120 (H) 70 - 99 mg/dL   BUN 13 6 - 20 mg/dL   Creatinine, Ser 2.441.17 0.61 - 1.24 mg/dL   Calcium 9.0 8.9 - 01.010.3 mg/dL   Total Protein 6.7 6.5 - 8.1 g/dL   Albumin 3.9 3.5 - 5.0 g/dL   AST 23 15 - 41 U/L   ALT 15 0 - 44 U/L   Alkaline Phosphatase 65 38 - 126 U/L   Total  Bilirubin 0.8 0.3 - 1.2 mg/dL   GFR calc non Af Amer >60 >60 mL/min   GFR calc Af Amer >60 >60 mL/min   Anion gap 12 5 - 15    Comment: Performed at [facility], [address] [place], [place] [address]  Magnesium     Status: None   Collection Time: 11/24/18 10:21 AM  Result Value Ref Range   Magnesium 2.1 1.7 - 2.4 mg/dL    Comment: Performed at [facility], [address] [place], [place] [address]  Troponin I - Once     Status: None   Collection Time: 11/24/18 10:21 AM  Result Value Ref Range   Troponin I <0.03 <0.03 ng/mL    Comment: Performed at [facility], [address] [place], [place] [address]  CBC with Differential/Platelet     Status: Abnormal   Collection Time: 11/24/18 10:21 AM  Result Value Ref Range   WBC 8.5 4.0 - 10.5 K/uL   RBC 4.86 4.22 - 5.81 MIL/uL   Hemoglobin 14.5 13.0 - 17.0 g/dL   HCT 59.543.2 63.839.0 - 75.652.0 %   MCV 88.9 80.0 - 100.0 fL   MCH 29.8 26.0 -  34.0 pg   MCHC 33.6 30.0 - 36.0 g/dL   RDW 04.8 88.9 - 16.9 %   Platelets 286 150 - 400 K/uL   nRBC 0.0 0.0 - 0.2 %   Neutrophils Relative % 47 %   Neutro Abs 4.0 1.7 - 7.7 K/uL   Lymphocytes Relative 34 %   Lymphs Abs 2.8 0.7 - 4.0 K/uL   Monocytes Relative 11 %   Monocytes Absolute 1.0 0.1 - 1.0 K/uL   Eosinophils Relative 7 %   Eosinophils Absolute 0.6 (H) 0.0 - 0.5 K/uL   Basophils Relative 1 %   Basophils Absolute 0.1 0.0 - 0.1 K/uL   Immature Granulocytes 0 %   Abs Immature Granulocytes 0.01 0.00 - 0.07 K/uL    Comment: Performed at [facility], [address] [place], [place] [address]  Protime-INR     Status: None   Collection Time: 11/24/18 10:21 AM  Result Value Ref Range   Prothrombin Time 12.7 11.4 - 15.2 seconds   INR 0.96     Comment: Performed at [facility], [address] [place], [place] [address]  Brain natriuretic peptide (order if patient c/o SOB ONLY)     Status: None   Collection Time: 11/24/18 11:07 AM  Result Value Ref Range   B  Natriuretic Peptide 28.3 0.0 - 100.0 pg/mL    Comment: Performed at [facility], [address], [place], [place] [address]   Dg Chest Portable 1 View  Result Date: 11/24/2018 CLINICAL DATA:  Shortness of breath and chest pain EXAM: PORTABLE CHEST 1 VIEW COMPARISON:  July 30, 2018 FINDINGS: The lungs are clear. Heart size and pulmonary vascularity are normal. No adenopathy. No bone lesions. No pneumothorax. IMPRESSION: No edema or consolidation. Electronically Signed   By: Bretta Bang III M.D.   On: 11/24/2018 10:35   Ct Angio Chest Aorta W And/or Wo Contrast  Result Date: 11/24/2018 CLINICAL DATA:  Chest pain EXAM: CT ANGIOGRAPHY CHEST WITH CONTRAST TECHNIQUE: Multidetector CT imaging of the chest was performed using the standard protocol during bolus administration of intravenous contrast. Multiplanar CT image reconstructions and MIPs were obtained to evaluate the vascular anatomy. CONTRAST:  <See Chart> ISOVUE-370 IOPAMIDOL (ISOVUE-370) INJECTION 76% COMPARISON:  Chest radiograph, 11/24/2018 FINDINGS: Cardiovascular: Preferential opacification of the thoracic aorta. There is dissection and aneurysm of the tubular ascending thoracic aorta measuring 4.4 x 4.4 cm in caliber. There is a large fenestration which terminates the dissection flap at the proximal aortic arch and the dissection flap does not involve the arch vessels or coronary sinuses. Normal heart size. No pericardial effusion. Mediastinum/Nodes: No enlarged mediastinal, hilar, or axillary lymph nodes. Thyroid gland, trachea, and esophagus demonstrate no significant findings. Lungs/Pleura: Lungs are clear. No pleural effusion or pneumothorax. Upper Abdomen: No acute abnormality. Musculoskeletal: No chest wall abnormality. No acute or significant osseous findings. Review of the MIP images confirms the above findings. Review of the MIP images confirms the above findings. IMPRESSION: There is dissection and aneurysm of the tubular  ascending thoracic aorta measuring 4.4 x 4.4 cm in caliber. There is a large fenestration which terminates the dissection flap at the proximal aortic arch and the dissection flap does not involve the arch vessels or coronary sinuses. These results were called by telephone at the time of interpretation on 11/24/2018 at 12:52 pm to Dr. Gerhard Munch , who verbally acknowledged these results. Electronically Signed   By: Lauralyn Primes M.D.   On: 11/24/2018 12:56  I personally reviewed the CT images and concur with the findings of an aortic dissection involving the ascending aorta.   ROS See HPI  Blood pressure (!) 179/119, pulse 73, resp. rate 17, SpO2 99 %. Physical Exam  Constitutional: He is oriented to person, place, and time. He appears well-developed and well-nourished. He appears distressed.  HENT:  Head: Normocephalic and atraumatic.  Eyes: EOM are normal.  Neck: Neck supple. No thyromegaly present.  Cardiovascular: Normal rate, regular rhythm and intact distal pulses.  No murmur heard. Respiratory: Effort normal and breath sounds normal. No respiratory distress. He has no wheezes. He has no rales.  Musculoskeletal:        General: No edema.  Neurological: He is alert and oriented to person, place, and time. No cranial nerve deficit. He exhibits normal muscle tone.     Assessment/Plan Stephen West is a 43 yo man with a history of hypertension, a NSTEMI and recent cocaine use who presents with sudden onset of chest pain. CT shows a Stanford type A, Debakey type II aortic dissection. Emergent repair is indicated for survival benefit and relief of symptoms.  I  discussed the general nature of the procedure, including the need for general anesthesia, the use of cardiopulmonary bypass, the use of hypothermic circulatory arrest and the incisions to be used with Stephen West. We discussed the expected hospital stay, overall recovery and short and long term outcomes. I informed him of the  indications, risks, benefits and alternatives. He understands the risks include but are not limited to death, stroke or other neurologic injury, MI, DVT/PE, bleeding, need for transfusions, infections, as well as other organ system dysfunction including respiratory, renal, or GI complications.   He accepts the risks and agrees to proceed.  OR has been notified  Loreli Slot, MD 11/24/2018, 1:44 PM

## 2018-11-24 NOTE — ED Notes (Addendum)
Pt assisted in using urinal.  

## 2018-11-24 NOTE — Progress Notes (Signed)
Patient arrived to OR alert and oriented. Patient able to confirm name, DOB, procedure, allergies (no latex or betadine allergies per patient), no metal in body, npo status and no pain at this time.   Yvetta Coder, RN

## 2018-11-25 ENCOUNTER — Inpatient Hospital Stay (HOSPITAL_COMMUNITY): Payer: BLUE CROSS/BLUE SHIELD

## 2018-11-25 LAB — POCT I-STAT 7, (LYTES, BLD GAS, ICA,H+H)
Acid-base deficit: 1 mmol/L (ref 0.0–2.0)
Acid-base deficit: 3 mmol/L — ABNORMAL HIGH (ref 0.0–2.0)
Acid-base deficit: 3 mmol/L — ABNORMAL HIGH (ref 0.0–2.0)
Acid-base deficit: 2 mmol/L (ref 0.0–2.0)
BICARBONATE: 22.2 mmol/L (ref 20.0–28.0)
Bicarbonate: 22.5 mmol/L (ref 20.0–28.0)
Bicarbonate: 23.5 mmol/L (ref 20.0–28.0)
Bicarbonate: 21 mmol/L (ref 20.0–28.0)
Calcium, Ion: 1.17 mmol/L (ref 1.15–1.40)
Calcium, Ion: 1.17 mmol/L (ref 1.15–1.40)
Calcium, Ion: 1.18 mmol/L (ref 1.15–1.40)
Calcium, Ion: 1.16 mmol/L (ref 1.15–1.40)
HCT: 26 % — ABNORMAL LOW (ref 39.0–52.0)
HCT: 26 % — ABNORMAL LOW (ref 39.0–52.0)
HCT: 26 % — ABNORMAL LOW (ref 39.0–52.0)
HCT: 27 % — ABNORMAL LOW (ref 39.0–52.0)
Hemoglobin: 8.8 g/dL — ABNORMAL LOW (ref 13.0–17.0)
Hemoglobin: 8.8 g/dL — ABNORMAL LOW (ref 13.0–17.0)
Hemoglobin: 8.8 g/dL — ABNORMAL LOW (ref 13.0–17.0)
Hemoglobin: 9.2 g/dL — ABNORMAL LOW (ref 13.0–17.0)
O2 SAT: 99 %
O2 SAT: 95 %
O2 Saturation: 96 %
O2 Saturation: 97 %
PCO2 ART: 37.3 mmHg (ref 32.0–48.0)
PH ART: 7.383 (ref 7.350–7.450)
Patient temperature: 36.3
Patient temperature: 37.1
Patient temperature: 37.4
Patient temperature: 37.3
Potassium: 4.2 mmol/L (ref 3.5–5.1)
Potassium: 4.3 mmol/L (ref 3.5–5.1)
Potassium: 4.3 mmol/L (ref 3.5–5.1)
Potassium: 4.4 mmol/L (ref 3.5–5.1)
Sodium: 138 mmol/L (ref 135–145)
Sodium: 138 mmol/L (ref 135–145)
Sodium: 139 mmol/L (ref 135–145)
Sodium: 139 mmol/L (ref 135–145)
TCO2: 23 mmol/L (ref 22–32)
TCO2: 24 mmol/L (ref 22–32)
TCO2: 25 mmol/L (ref 22–32)
TCO2: 22 mmol/L (ref 22–32)
pCO2 arterial: 38.4 mmHg (ref 32.0–48.0)
pCO2 arterial: 41 mmHg (ref 32.0–48.0)
pCO2 arterial: 31.1 mmHg — ABNORMAL LOW (ref 32.0–48.0)
pH, Arterial: 7.349 — ABNORMAL LOW (ref 7.350–7.450)
pH, Arterial: 7.393 (ref 7.350–7.450)
pH, Arterial: 7.44 (ref 7.350–7.450)
pO2, Arterial: 135 mmHg — ABNORMAL HIGH (ref 83.0–108.0)
pO2, Arterial: 81 mmHg — ABNORMAL LOW (ref 83.0–108.0)
pO2, Arterial: 99 mmHg (ref 83.0–108.0)
pO2, Arterial: 72 mmHg — ABNORMAL LOW (ref 83.0–108.0)

## 2018-11-25 LAB — BASIC METABOLIC PANEL
Anion gap: 8 (ref 5–15)
BUN: 8 mg/dL (ref 6–20)
CO2: 21 mmol/L — ABNORMAL LOW (ref 22–32)
Calcium: 8.2 mg/dL — ABNORMAL LOW (ref 8.9–10.3)
Chloride: 107 mmol/L (ref 98–111)
Creatinine, Ser: 0.99 mg/dL (ref 0.61–1.24)
GFR calc Af Amer: >60 mL/min (ref >60)
GFR calc non Af Amer: >60 mL/min (ref >60)
Glucose, Bld: 158 mg/dL — ABNORMAL HIGH (ref 70–99)
POTASSIUM: 4.2 mmol/L (ref 3.5–5.1)
Sodium: 136 mmol/L (ref 135–145)

## 2018-11-25 LAB — CBC WITH DIFFERENTIAL/PLATELET
ABS IMMATURE GRANULOCYTES: 0.06 10*3/uL (ref 0.00–0.07)
Basophils Absolute: 0 10*3/uL (ref 0.0–0.1)
Basophils Relative: 0 %
Eosinophils Absolute: 0 10*3/uL (ref 0.0–0.5)
Eosinophils Relative: 0 %
HCT: 27.5 % — ABNORMAL LOW (ref 39.0–52.0)
Hemoglobin: 9.3 g/dL — ABNORMAL LOW (ref 13.0–17.0)
Immature Granulocytes: 1 %
Lymphocytes Relative: 9 %
Lymphs Abs: 1.1 10*3/uL (ref 0.7–4.0)
MCH: 30 pg (ref 26.0–34.0)
MCHC: 33.8 g/dL (ref 30.0–36.0)
MCV: 88.7 fL (ref 80.0–100.0)
MONO ABS: 1 10*3/uL (ref 0.1–1.0)
Monocytes Relative: 8 %
Neutro Abs: 10.4 10*3/uL — ABNORMAL HIGH (ref 1.7–7.7)
Neutrophils Relative %: 82 %
PLATELETS: 143 10*3/uL — AB (ref 150–400)
RBC: 3.1 MIL/uL — ABNORMAL LOW (ref 4.22–5.81)
RDW: 13.4 % (ref 11.5–15.5)
WBC: 12.6 10*3/uL — AB (ref 4.0–10.5)
nRBC: 0 % (ref 0.0–0.2)

## 2018-11-25 LAB — MRSA PCR SCREENING: MRSA by PCR: POSITIVE — AB

## 2018-11-25 LAB — GLUCOSE, CAPILLARY
GLUCOSE-CAPILLARY: 116 mg/dL — AB (ref 70–99)
Glucose-Capillary: 100 mg/dL — ABNORMAL HIGH (ref 70–99)
Glucose-Capillary: 109 mg/dL — ABNORMAL HIGH (ref 70–99)
Glucose-Capillary: 112 mg/dL — ABNORMAL HIGH (ref 70–99)
Glucose-Capillary: 116 mg/dL — ABNORMAL HIGH (ref 70–99)
Glucose-Capillary: 123 mg/dL — ABNORMAL HIGH (ref 70–99)
Glucose-Capillary: 124 mg/dL — ABNORMAL HIGH (ref 70–99)
Glucose-Capillary: 125 mg/dL — ABNORMAL HIGH (ref 70–99)
Glucose-Capillary: 130 mg/dL — ABNORMAL HIGH (ref 70–99)
Glucose-Capillary: 131 mg/dL — ABNORMAL HIGH (ref 70–99)
Glucose-Capillary: 133 mg/dL — ABNORMAL HIGH (ref 70–99)
Glucose-Capillary: 142 mg/dL — ABNORMAL HIGH (ref 70–99)
Glucose-Capillary: 90 mg/dL (ref 70–99)
Glucose-Capillary: 96 mg/dL (ref 70–99)

## 2018-11-25 LAB — MAGNESIUM: Magnesium: 2.9 mg/dL — ABNORMAL HIGH (ref 1.7–2.4)

## 2018-11-25 MED ORDER — CHLORHEXIDINE GLUCONATE CLOTH 2 % EX PADS
6.0000 | MEDICATED_PAD | Freq: Every day | CUTANEOUS | Status: DC
  Administered 2018-11-25 – 2018-11-26 (×2): 6 via TOPICAL

## 2018-11-25 MED ORDER — DIPHENHYDRAMINE HCL 25 MG PO CAPS
25.0000 mg | ORAL_CAPSULE | Freq: Once | ORAL | Status: AC
  Administered 2018-11-26: 25 mg via ORAL
  Filled 2018-11-25: qty 1

## 2018-11-25 MED ORDER — MUPIROCIN 2 % EX OINT
TOPICAL_OINTMENT | Freq: Two times a day (BID) | CUTANEOUS | Status: DC
  Administered 2018-11-25: 1 via NASAL
  Administered 2018-11-25 – 2018-11-27 (×4): via NASAL
  Administered 2018-11-27: 1 via NASAL
  Administered 2018-11-28 – 2018-11-30 (×5): via NASAL
  Filled 2018-11-25 (×2): qty 22

## 2018-11-25 MED ORDER — ALPRAZOLAM 0.25 MG PO TABS
0.2500 mg | ORAL_TABLET | Freq: Four times a day (QID) | ORAL | Status: DC | PRN
  Administered 2018-11-25 – 2018-11-29 (×5): 0.25 mg via ORAL
  Filled 2018-11-25 (×5): qty 1

## 2018-11-25 MED ORDER — HYDRALAZINE HCL 20 MG/ML IJ SOLN
10.0000 mg | INTRAMUSCULAR | Status: DC | PRN
  Administered 2018-11-25 – 2018-11-29 (×4): 10 mg via INTRAVENOUS
  Filled 2018-11-25 (×5): qty 1

## 2018-11-25 MED ORDER — INSULIN DETEMIR 100 UNIT/ML ~~LOC~~ SOLN
15.0000 [IU] | Freq: Two times a day (BID) | SUBCUTANEOUS | Status: DC
  Administered 2018-11-25: 15 [IU] via SUBCUTANEOUS
  Filled 2018-11-25 (×3): qty 0.15

## 2018-11-25 MED ORDER — SODIUM CHLORIDE 0.9% FLUSH: 10.0000 mL | INTRAVENOUS | Status: DC | PRN

## 2018-11-25 MED ORDER — INSULIN ASPART 100 UNIT/ML ~~LOC~~ SOLN
0.0000 [IU] | SUBCUTANEOUS | Status: DC
  Administered 2018-11-25: 2 [IU] via SUBCUTANEOUS

## 2018-11-25 MED ORDER — SODIUM CHLORIDE 0.9% FLUSH
10.0000 mL | Freq: Two times a day (BID) | INTRAVENOUS | Status: DC
  Administered 2018-11-26: 10 mL

## 2018-11-25 MED ORDER — AMIODARONE IV BOLUS ONLY 150 MG/100ML
150.0000 mg | Freq: Once | INTRAVENOUS | Status: AC
  Administered 2018-11-25: 150 mg via INTRAVENOUS
  Filled 2018-11-25: qty 100

## 2018-11-25 MED ORDER — LABETALOL HCL 5 MG/ML IV SOLN
10.0000 mg | INTRAVENOUS | Status: DC | PRN
  Administered 2018-11-29: 10 mg via INTRAVENOUS
  Filled 2018-11-25: qty 4

## 2018-11-25 NOTE — Progress Notes (Signed)
Patient following commands.  Sedation decreased.  Will beginning weaning to extubate.

## 2018-11-25 NOTE — Progress Notes (Signed)
Spoke to Barrett PA about Levimir order. Patient has no history of diabetes. This was verified with patient and family. Glucose has been within normal range, off insulin drip, and at 116 after breakfast. Will hold Levimir for now, cover with sliding scale until subsequent review of the chart.

## 2018-11-25 NOTE — Procedures (Signed)
Extubation Procedure Note  Patient Details:   Name: Stephen West DOB: 1976-10-03 MRN: [id number]   Airway Documentation:    Vent end date: 11/25/18 Vent end time: 0500   Evaluation  O2 sats: stable throughout Complications: No apparent complications Patient did tolerate procedure well. Bilateral Breath Sounds: Clear, Diminished   Yes   Patient tolerated rapid wean. NIF -24 and VC 2.0L. Positive for cuff leak. Patient extubated to a 2 Lpm nasal cannula. No signs of dyspnea or stridor noted. Patient instructed on the Incentive Spirometer achieving 1250 mL three times. Patient resting comfortably. RN at bedside.   Ancil Boozer 11/25/2018, 5:16 AM

## 2018-11-25 NOTE — Progress Notes (Signed)
Patient doing well with following commands.  Respiratory working with patient to meet weaning parameters.  Will monitor.

## 2018-11-25 NOTE — Progress Notes (Signed)
1 Day Post-Op Procedure(s) (LRB): HEMIARCH REPAIR OF TYPE ONE AORTIC DISSECTION WITH ANTIGRADE CEREBRAL PERFUSSION AND MODERATE HYPOTHERMIC CIRCULATORY ARREST. (N/A) Subjective: C/o incisional pain  Objective: Vital signs in last 24 hours: Temp:  [96.1 F (35.6 C)-99.9 F (37.7 C)] 99.9 F (37.7 C) (02/05 0700) Pulse Rate:  [64-94] 64 (02/05 0700) Resp:  [11-24] 19 (02/05 0700) BP: (93-204)/(61-152) 109/86 (02/05 0700) SpO2:  [95 %-100 %] 100 % (02/05 0700) Arterial Line BP: (73-143)/(45-91) 126/70 (02/05 0700) FiO2 (%):  [40 %-50 %] 40 % (02/05 0425) Weight:  [95 kg] 95 kg (02/05 0321)  Hemodynamic parameters for last 24 hours: PAP: (19-32)/(11-17) 32/17 CO:  [3.6 L/min-4.8 L/min] 4.8 L/min CI:  [1.7 L/min/m2-2.2 L/min/m2] 2.2 L/min/m2  Intake/Output from previous day: 02/04 0701 - 02/05 0700 In: 5742.9 [I.V.:4229; Blood:586; IV Piggyback:927.9] Out: 9458 [Urine:1795; Blood:1850; Chest Tube:990] Intake/Output this shift: No intake/output data recorded.  General appearance: alert, cooperative and no distress Neurologic: intact Heart: irregularly irregular rhythm Lungs: diminished breath sounds bibasilar Abdomen: normal findings: soft, non-tender Extremities: well perfused, pulses intact  Lab Results: Recent Labs    11/24/18 2125  11/25/18 0315  11/25/18 0454 11/25/18 0644  WBC 17.3*  --  12.6*  --   --   --   HGB 10.9*   < > 9.3*   < > 8.8* 9.2*  HCT 33.2*   < > 27.5*   < > 26.0* 27.0*  PLT 148*  --  143*  --   --   --    < > = values in this interval not displayed.   BMET:  Recent Labs    11/24/18 1021  11/24/18 2141  11/25/18 0315  11/25/18 0454 11/25/18 0644  NA 139   < > 139   < > 136   < > 139 139  K 3.6   < > 4.4   < > 4.2   < > 4.3 4.4  CL 106  --   --   --  107  --   --   --   CO2 21*  --   --   --  21*  --   --   --   GLUCOSE 120*   < > 113*  --  158*  --   --   --   BUN 13  --   --   --  8  --   --   --   CREATININE 1.17  --   --   --  0.99   --   --   --   CALCIUM 9.0  --   --   --  8.2*  --   --   --    < > = values in this interval not displayed.    PT/INR:  Recent Labs    11/24/18 2125  LABPROT 17.7*  INR 1.47   ABG    Component Value Date/Time   PHART 7.440 11/25/2018 0644   HCO3 21.0 11/25/2018 0644   TCO2 22 11/25/2018 0644   ACIDBASEDEF 2.0 11/25/2018 0644   O2SAT 95.0 11/25/2018 0644   CBG (last 3)  Recent Labs    11/25/18 0428 11/25/18 0532 11/25/18 0640  GLUCAP 133* 123* 109*    Assessment/Plan: S/P Procedure(s) (LRB): HEMIARCH REPAIR OF TYPE ONE AORTIC DISSECTION WITH ANTIGRADE CEREBRAL PERFUSSION AND MODERATE HYPOTHERMIC CIRCULATORY ARREST. (N/A) -POD # 1 repair type 1 dissection CV- good hemodynamics, although still on neo for BP- dc swan, A line  Atrial  fib- started intraop- on amiodarone- rebolus this AM RESP- IS for atelectasis RENAL- creatinine normal, lytes oK ENDO- CBG well controlled with insulin drip- transition to levemir + SSI Anemia secondary to ABL- stable, follow DVT prophylaxis- SCD only, no lovenox today GI- advance diet as tolerated   LOS: 1 day    Loreli Slot 11/25/2018

## 2018-11-25 NOTE — Progress Notes (Signed)
Attempted to decrease sedation, patient woke up agitated, not following commands.  Restarted sedation.  Will reattempt.

## 2018-11-25 NOTE — Progress Notes (Signed)
Patient ID: Stephen West, male   DOB: Mar 01, 1976, 43 y.o.   MRN: [id number] TCTS Evening Rounds:  Hemodynamically stable in sinus rhythm on IV amio sats 96%  Urine output good CT output 40-50 cc/hr, thin  Glucose under good control

## 2018-11-25 NOTE — Plan of Care (Signed)
Patient arrived via bed from OR, placed on monitor.  Titrating gtts, albumin given as per protocol for pressure, low index, Dr. Dorris Fetch aware.  Monitoring as per protocol.

## 2018-11-26 ENCOUNTER — Encounter (HOSPITAL_COMMUNITY): Payer: Self-pay | Admitting: Thoracic Surgery (Cardiothoracic Vascular Surgery)

## 2018-11-26 ENCOUNTER — Inpatient Hospital Stay (HOSPITAL_COMMUNITY): Payer: BLUE CROSS/BLUE SHIELD

## 2018-11-26 LAB — CBC
HCT: 24.4 % — ABNORMAL LOW (ref 39.0–52.0)
Hemoglobin: 8.4 g/dL — ABNORMAL LOW (ref 13.0–17.0)
MCH: 30.4 pg (ref 26.0–34.0)
MCHC: 34.4 g/dL (ref 30.0–36.0)
MCV: 88.4 fL (ref 80.0–100.0)
Platelets: 151 10*3/uL (ref 150–400)
RBC: 2.76 MIL/uL — ABNORMAL LOW (ref 4.22–5.81)
RDW: 13.8 % (ref 11.5–15.5)
WBC: 19.4 10*3/uL — ABNORMAL HIGH (ref 4.0–10.5)
nRBC: 0 % (ref 0.0–0.2)

## 2018-11-26 LAB — BASIC METABOLIC PANEL
Anion gap: 11 (ref 5–15)
BUN: 11 mg/dL (ref 6–20)
CHLORIDE: 100 mmol/L (ref 98–111)
CO2: 22 mmol/L (ref 22–32)
Calcium: 8.4 mg/dL — ABNORMAL LOW (ref 8.9–10.3)
Creatinine, Ser: 1.02 mg/dL (ref 0.61–1.24)
GFR calc Af Amer: >60 mL/min (ref >60)
GFR calc non Af Amer: >60 mL/min (ref >60)
Glucose, Bld: 118 mg/dL — ABNORMAL HIGH (ref 70–99)
Potassium: 3.8 mmol/L (ref 3.5–5.1)
Sodium: 133 mmol/L — ABNORMAL LOW (ref 135–145)

## 2018-11-26 LAB — GLUCOSE, CAPILLARY
Glucose-Capillary: 88 mg/dL (ref 70–99)
Glucose-Capillary: 66 mg/dL — ABNORMAL LOW (ref 70–99)
Glucose-Capillary: 99 mg/dL (ref 70–99)
Glucose-Capillary: 99 mg/dL (ref 70–99)

## 2018-11-26 MED ORDER — HYDROXYZINE HCL 10 MG PO TABS
10.0000 mg | ORAL_TABLET | Freq: Four times a day (QID) | ORAL | Status: DC | PRN
  Administered 2018-11-26 – 2018-11-27 (×4): 10 mg via ORAL
  Filled 2018-11-26 (×5): qty 1

## 2018-11-26 MED ORDER — AMIODARONE HCL 200 MG PO TABS
200.0000 mg | ORAL_TABLET | Freq: Two times a day (BID) | ORAL | Status: DC
  Administered 2018-11-26 – 2018-11-30 (×8): 200 mg via ORAL
  Filled 2018-11-26 (×8): qty 1

## 2018-11-26 MED ORDER — ENOXAPARIN SODIUM 40 MG/0.4ML ~~LOC~~ SOLN
40.0000 mg | SUBCUTANEOUS | Status: DC
  Administered 2018-11-26 – 2018-11-30 (×5): 40 mg via SUBCUTANEOUS
  Filled 2018-11-26 (×5): qty 0.4

## 2018-11-26 MED ORDER — METOPROLOL TARTRATE 25 MG/10 ML ORAL SUSPENSION
25.0000 mg | Freq: Two times a day (BID) | ORAL | Status: DC
  Filled 2018-11-26 (×2): qty 10

## 2018-11-26 MED ORDER — METOPROLOL TARTRATE 25 MG PO TABS
25.0000 mg | ORAL_TABLET | Freq: Two times a day (BID) | ORAL | Status: DC
  Administered 2018-11-26 – 2018-11-27 (×4): 25 mg via ORAL
  Filled 2018-11-26 (×4): qty 1

## 2018-11-26 MED ORDER — ORAL CARE MOUTH RINSE
15.0000 mL | Freq: Two times a day (BID) | OROMUCOSAL | Status: DC
  Administered 2018-11-26 – 2018-11-30 (×8): 15 mL via OROMUCOSAL

## 2018-11-26 MED ORDER — INSULIN ASPART 100 UNIT/ML ~~LOC~~ SOLN: 0.0000 [IU] | Freq: Three times a day (TID) | SUBCUTANEOUS | Status: DC

## 2018-11-26 MED ORDER — FUROSEMIDE 10 MG/ML IJ SOLN
40.0000 mg | Freq: Once | INTRAMUSCULAR | Status: AC
  Administered 2018-11-26: 40 mg via INTRAVENOUS
  Filled 2018-11-26: qty 4

## 2018-11-26 NOTE — Plan of Care (Signed)
  Problem: Education: Goal: Will demonstrate proper wound care and an understanding of methods to prevent future damage Outcome: Progressing Goal: Knowledge of disease or condition will improve Outcome: Progressing Goal: Knowledge of the prescribed therapeutic regimen will improve Outcome: Progressing   Problem: Activity: Goal: Risk for activity intolerance will decrease Outcome: Progressing   Problem: Cardiac: Goal: Will achieve and/or maintain hemodynamic stability Outcome: Progressing   Problem: Clinical Measurements: Goal: Postoperative complications will be avoided or minimized Outcome: Progressing   Problem: Respiratory: Goal: Respiratory status will improve Outcome: Progressing   Problem: Skin Integrity: Goal: Wound healing without signs and symptoms of infection Outcome: Progressing Goal: Risk for impaired skin integrity will decrease Outcome: Progressing   Problem: Education: Goal: Knowledge of General Education information will improve Description Including pain rating scale, medication(s)/side effects and non-pharmacologic comfort measures Outcome: Progressing   Problem: Health Behavior/Discharge Planning: Goal: Ability to manage health-related needs will improve Outcome: Progressing   Problem: Clinical Measurements: Goal: Ability to maintain clinical measurements within normal limits will improve Outcome: Progressing Goal: Will remain free from infection Outcome: Progressing Goal: Diagnostic test results will improve Outcome: Progressing Goal: Respiratory complications will improve Outcome: Progressing Goal: Cardiovascular complication will be avoided Outcome: Progressing   Problem: Activity: Goal: Risk for activity intolerance will decrease Outcome: Progressing   Problem: Coping: Goal: Level of anxiety will decrease Outcome: Progressing   Problem: Elimination: Goal: Will not experience complications related to bowel motility Outcome:  Progressing Goal: Will not experience complications related to urinary retention Outcome: Progressing   Problem: Pain Managment: Goal: General experience of comfort will improve Outcome: Progressing   Problem: Safety: Goal: Ability to remain free from injury will improve Outcome: Progressing   Problem: Skin Integrity: Goal: Risk for impaired skin integrity will decrease Outcome: Progressing

## 2018-11-26 NOTE — Anesthesia Postprocedure Evaluation (Signed)
Anesthesia Post Note  Patient: Stephen West  Procedure(s) Performed: HEMIARCH REPAIR OF TYPE ONE AORTIC DISSECTION WITH ANTIGRADE CEREBRAL PERFUSSION AND MODERATE HYPOTHERMIC CIRCULATORY ARREST. (N/A Chest)     Patient location during evaluation: SICU Anesthesia Type: General Level of consciousness: sedated Pain management: pain level controlled Vital Signs Assessment: post-procedure vital signs reviewed and stable Respiratory status: patient remains intubated per anesthesia plan Cardiovascular status: stable Postop Assessment: no apparent nausea or vomiting Anesthetic complications: no    Last Vitals:  Vitals:   11/26/18 1200 11/26/18 1300  BP: 118/75 126/69  Pulse:  73  Resp: 19 17  Temp:    SpO2:  (!) 86%    Last Pain:  Vitals:   11/26/18 1100  TempSrc: Oral  PainSc:    Pain Goal:                   Takita Riecke S

## 2018-11-26 NOTE — Plan of Care (Signed)
  Problem: Activity: Goal: Risk for activity intolerance will decrease Outcome: Progressing   Problem: Cardiac: Goal: Will achieve and/or maintain hemodynamic stability Outcome: Progressing   Problem: Clinical Measurements: Goal: Postoperative complications will be avoided or minimized Outcome: Progressing   Problem: Respiratory: Goal: Respiratory status will improve Outcome: Progressing   Problem: Skin Integrity: Goal: Wound healing without signs and symptoms of infection Outcome: Progressing Goal: Risk for impaired skin integrity will decrease Outcome: Progressing   Problem: Urinary Elimination: Goal: Ability to achieve and maintain adequate renal perfusion and functioning will improve Outcome: Progressing   

## 2018-11-26 NOTE — Discharge Summary (Addendum)
Physician Discharge Summary  Patient ID: Stephen West MRN: [id number] DOB/AGE: [birth year] 43 y.o.  Admit date: 11/24/2018 Discharge date: 11/30/2018  Admission Diagnoses: 1. Type 1 aortic dissection 2. S/p NSTEMI [facility])  Discharge Diagnoses:  1. S/p repair of type 1 aortic dissection  2. History of hypertension 3. History of polysubstance abuse 4. History of bronchitis  Discharged Condition: Stable and discharged to home.  Chief Complaint: chest pain  HPI: [name] is a 43 yo man with a history of hypertension, NSTEMI and cocaine abuse who presented with acute onset of chest pain. Pain started spontaneously on the morning of admission in his left neck and then shortly thereafter became severe central chest pain. He was severely hypertensive when he arrived in the emergency department. A CT chest showed a type A aortic dissection.  Hospital Course: [name] BP was treated acutely and he was taken to the OR emergently where HEMIARCH REPAIR OF TYPE I AORTIC DISSECTION USING 30 mm HEMASHIELD GRAFT WITH MODERATE HYPOTHERMIC CIRCULATORY ARREST AND ANTEGRADE CEREBRAL PERFUSION was performed.  Following the procedure the patient was taken transferred to the CVICU  Where he remained hemodynamically stable.  He was weaned from the ventilator and extubated without difficulty. His BP was managed with IV nitroglycerine, IV metoprolol, and hydralazine.  The PA catheter was removed on the first post-op day and the arterial line, chest tubes, and Foley catheter were removed on the following day.  He was mobilized with physical therapy.  We requested a cardiology consult for recommendations regarding long-term BP management. He was transferred from the ICU to 4E for further convalescence on 02/07. He was more hypertensive so Lisinopril was restarted and Lopressor was changed to Carvedilol (as taken PTA). He was still hypertensive so Clonidine was added. His wounds are continuing to heal without signs of  infection. He has been ambulating on room air. He is tolerating a diet and has had a bowel movement. Epicardial pacing wires were removed on 02/08. Chest tube sutures will be removed the day of discharge. Patient is felt surgically stable for discharge today.  Consults: cardiology  Significant Diagnostic Studies:   EXAM: CT ANGIOGRAPHY CHEST WITH CONTRAST  TECHNIQUE: Multidetector CT imaging of the chest was performed using the standard protocol during bolus administration of intravenous contrast. Multiplanar CT image reconstructions and MIPs were obtained to evaluate the vascular anatomy.  CONTRAST:  <See Chart> ISOVUE-370 IOPAMIDOL (ISOVUE-370) INJECTION 76%  COMPARISON:  Chest radiograph, 11/24/2018  FINDINGS: Cardiovascular: Preferential opacification of the thoracic aorta. There is dissection and aneurysm of the tubular ascending thoracic aorta measuring 4.4 x 4.4 cm in caliber. There is a large fenestration which terminates the dissection flap at the proximal aortic arch and the dissection flap does not involve the arch vessels or coronary sinuses. Normal heart size. No pericardial effusion.  Mediastinum/Nodes: No enlarged mediastinal, hilar, or axillary lymph nodes. Thyroid gland, trachea, and esophagus demonstrate no significant findings.  Lungs/Pleura: Lungs are clear. No pleural effusion or pneumothorax.  Upper Abdomen: No acute abnormality.  Musculoskeletal: No chest wall abnormality. No acute or significant osseous findings.  Review of the MIP images confirms the above findings.  Review of the MIP images confirms the above findings.  IMPRESSION: There is dissection and aneurysm of the tubular ascending thoracic aorta measuring 4.4 x 4.4 cm in caliber. There is a large fenestration which terminates the dissection flap at the proximal aortic arch and the dissection flap does not involve the arch vessels or coronary sinuses.  These  results were  called by telephone at the time of interpretation on 11/24/2018 at 12:52 pm to Dr. [name] , who verbally acknowledged these results.   Electronically Signed   By: Lauralyn PrimesAlex  Bibbey M.D.   On: 11/24/2018 12:56  Treatments:   OPERATIVE REPORT  NAME: Stephen West, Stephen D. MEDICAL RECORD ZO:[id number] ACCOUNT [id number] DATE OF BIRTH:Jan 10, 1976 FACILITY: [facility] LOCATION: [facility] PHYSICIAN:Joann Kulpa Lars Pinks. Gaston Dase, MD  DATE OF PROCEDURE:  11/24/2018  PREOPERATIVE DIAGNOSIS:  Type 1 aortic dissection.  POSTOPERATIVE DIAGNOSIS:  Type 1 aortic dissection.  PROCEDURE:  Hemiarch repair of type 1 aortic dissection with moderate hypothermic circulatory arrest and antegrade cerebral perfusion.  SURGEON:  Charlett LangoSteven Chalmer Zheng, MD  ASSISTANT:  Gershon CraneWayne Gold  ANESTHESIA:  General.  FINDINGS:  Transesophageal echocardiography showed severe left ventricular hypertrophy and dissection flap in the ascending aorta.  No flap in the descending aorta.  Type 1 dissection with a small blood-tinged pericardial effusion.  Dissection flap  extending from just above the sinotubular junction to the arch terminating adjacent to the takeoff of the left subclavian artery.  Arch vessels not involved.  Post-bypass transesophageal echocardiography unchanged from pre-bypass study.  Discharge Exam: Blood pressure (!) 132/102, pulse 78, temperature 97.6 F (36.4 C), temperature source Oral, resp. rate 13, height 6' (1.829 m), weight 92.4 kg, SpO2 95 %.   General appearance: Alert, cooperative, mild distress related to sternal pain that is ocurring with cough. Heart: regular rate and rhythm and good peripheral perfusion. Lungs: Breath sounds clear. Abdomen: Soft and non-tender. Extremities: All well perfused with no edema. Wound: Sternal incsion well approximated, clean and dry. Sternum is stable.  Discharge disposition: 01-Home or Self Care  Discharge Medications:    Stable and discharged to  home. Discharge Instructions    Amb Referral to Cardiac Rehabilitation   Complete by:  As directed    Diagnosis:  Other Comment - aortic dissection repair     Allergies as of 11/30/2018      Reactions   Carrot [daucus Carota] Anaphylaxis   Unknown   Banana Nausea And Vomiting      Medication List    STOP taking these medications   diltiazem 240 MG 24 hr capsule Commonly known as:  DILACOR XR   loratadine 10 MG tablet Commonly known as:  CLARITIN   naproxen 500 MG tablet Commonly known as:  NAPROSYN   predniSONE 20 MG tablet Commonly known as:  DELTASONE     TAKE these medications   amiodarone 200 MG tablet Commonly known as:  PACERONE Take 1 tablet (200 mg total) by mouth 2 (two) times daily.   aspirin 325 MG EC tablet Take 1 tablet (325 mg total) by mouth daily.   atorvastatin 40 MG tablet Commonly known as:  LIPITOR Take 1 tablet (40 mg total) by mouth daily at 6 PM.   benzonatate 200 MG capsule Commonly known as:  TESSALON Take 1 capsule (200 mg total) by mouth 2 (two) times daily as needed for cough.   carvedilol 25 MG tablet Commonly known as:  COREG Take 1 tablet (25 mg total) by mouth 2 (two) times daily. What changed:    medication strength  how much to take  when to take this   cloNIDine 0.2 MG tablet Commonly known as:  CATAPRES Take 1 tablet (0.2 mg total) by mouth 2 (two) times daily.   fluticasone 50 MCG/ACT nasal spray Commonly known as:  FLONASE Place 2 sprays into both nostrils daily.   Guaifenesin 1200 MG Tb12 Commonly  known as:  MUCINEX MAXIMUM STRENGTH Take 1 tablet (1,200 mg total) by mouth 2 (two) times daily.   hydrochlorothiazide 25 MG tablet Commonly known as:  HYDRODIURIL Take 1 tablet (25 mg total) by mouth daily.   HYDROmorphone 2 MG tablet Commonly known as:  DILAUDID Take 1 tablet (2 mg total) by mouth every 4 (four) hours as needed for moderate pain or severe pain.   lisinopril 40 MG tablet Commonly known as:   PRINIVIL,ZESTRIL Take 1 tablet (40 mg total) by mouth daily.   potassium chloride SA 20 MEQ tablet Commonly known as:  K-DUR,KLOR-CON Take 1 tablet (20 mEq total) by mouth daily.     The patient has been discharged on:   1.Beta Blocker:  Yes [  x ]                              No   [   ]                              If No, reason:  2.Ace Inhibitor/ARB: Yes [ x  ]                                     No  [    ]                                     If No, reason:  3.Statin:   Yes [ x  ]                  No  [   ]                  If No, reason:  4.Ecasa:  Yes  [ x  ]                  No   [   ]                  If No, reason:   Follow-up Information    Loreli SlotHendrickson, Tessi Eustache C, MD Follow up on 12/29/2018.   Specialty:  Cardiothoracic Surgery Why:  You are scheduled for follow up with Dr. Dorris FetchHendrickson on Tuesday 12/29/2018 at 11:30 Please arrive 30 minutes prior to the appointment for a chest X-ray to be done on the first floor of the same building. Contact information: [address] [place] [address] [phone]        Manson PasseyBhagat, Bhavinkumar, [place]. Go on 12/16/2018.   Specialty:  Cardiology Why:  Appointment time is at 9:30 am Contact information: [address] [place] [address] [phone]           Signed: Lowella Dandyrin Barrett, PA-C 11/30/2018, 9:02 AM

## 2018-11-26 NOTE — Progress Notes (Addendum)
[facility] DAILY ICU PROGRESS NOTE                   [address]            [place] [address]          [phone]   2 Days Post-Op Procedure(s) (LRB): HEMIARCH REPAIR OF TYPE ONE AORTIC DISSECTION WITH ANTIGRADE CEREBRAL PERFUSSION AND MODERATE HYPOTHERMIC CIRCULATORY ARREST. (N/A)  Total Length of Stay:  LOS: 2 days   Subjective:  Patient feeling better than yesterday.  He complains of itching mostly along his arms since last night.  He continues to experience pain, but this does get better with pain medication.  + ambulation  Objective: Vital signs in last 24 hours: Temp:  [97.2 F (36.2 C)-99.1 F (37.3 C)] 98.7 F (37.1 C) (02/06 0715) Pulse Rate:  [48-115] 73 (02/06 0715) Cardiac Rhythm: Normal sinus rhythm (02/06 0400) Resp:  [8-23] 17 (02/06 0715) BP: (108-162)/(69-102) 129/79 (02/06 0600) SpO2:  [87 %-100 %] 87 % (02/06 0715) Arterial Line BP: (99-190)/(53-116) 120/57 (02/06 0715) Weight:  [95 kg] 95 kg (02/05 1900)  Filed Weights   11/25/18 0321 11/25/18 1900  Weight: 95 kg 95 kg    Weight change: 0 kg   Hemodynamic parameters for last 24 hours: PAP: (19)/(11) 19/11  Intake/Output from previous day: 02/05 0701 - 02/06 0700 In: 1778.1 [P.O.:960; I.V.:618.2; IV Piggyback:199.9] Out: 1780 [Urine:1000; Chest Tube:780]  Current Meds: Scheduled Meds: . acetaminophen  1,000 mg Oral Q6H   Or  . acetaminophen (TYLENOL) oral liquid 160 mg/5 mL  1,000 mg Per Tube Q6H  . aspirin EC  325 mg Oral Daily   Or  . aspirin  324 mg Per Tube Daily  . bisacodyl  10 mg Oral Daily   Or  . bisacodyl  10 mg Rectal Daily  . Chlorhexidine Gluconate Cloth  6 each Topical Daily  . docusate sodium  200 mg Oral Daily  . insulin detemir  15 Units Subcutaneous BID  . insulin regular  0-10 Units Intravenous TID WC  . metoprolol tartrate  25 mg Oral BID   Or  . metoprolol tartrate  25 mg Per Tube BID  . mupirocin ointment   Nasal BID  . pantoprazole  40 mg Oral  Daily  . sodium chloride flush  10-40 mL Intracatheter Q12H  . sodium chloride flush  3 mL Intravenous Q12H   Continuous Infusions: . sodium chloride Stopped (11/25/18 0907)  . sodium chloride Stopped (11/25/18 0559)  . sodium chloride Stopped (11/24/18 2317)  . amiodarone 30 mg/hr (11/26/18 0700)  . cefUROXime (ZINACEF)  IV Stopped (11/26/18 0008)  . insulin Stopped (11/25/18 0930)  . lactated ringers    . lactated ringers Stopped (11/24/18 2239)  . lactated ringers Stopped (11/25/18 0945)   PRN Meds:.sodium chloride, ALPRAZolam, hydrALAZINE, labetalol, lactated ringers, midazolam, morphine injection, ondansetron (ZOFRAN) IV, oxyCODONE, sodium chloride flush, sodium chloride flush, traMADol  General appearance: alert, cooperative and no distress Heart: regular rate and rhythm Lungs: clear to auscultation bilaterally Abdomen: soft, non-tender; bowel sounds normal; no masses,  no organomegaly Extremities: edema trace Wound: clean and dry  Lab Results: CBC: Recent Labs    11/25/18 0315  11/25/18 0644 11/26/18 0358  WBC 12.6*  --   --  19.4*  HGB 9.3*   < > 9.2* 8.4*  HCT 27.5*   < > 27.0* 24.4*  PLT 143*  --   --  151   < > =  values in this interval not displayed.   BMET:  Recent Labs    11/25/18 0315  11/25/18 0644 11/26/18 0358  NA 136   < > 139 133*  K 4.2   < > 4.4 3.8  CL 107  --   --  100  CO2 21*  --   --  22  GLUCOSE 158*  --   --  118*  BUN 8  --   --  11  CREATININE 0.99  --   --  1.02  CALCIUM 8.2*  --   --  8.4*   < > = values in this interval not displayed.    CMET: Lab Results  Component Value Date   WBC 19.4 (H) 11/26/2018   HGB 8.4 (L) 11/26/2018   HCT 24.4 (L) 11/26/2018   PLT 151 11/26/2018   GLUCOSE 118 (H) 11/26/2018   CHOL 138 04/07/2017   TRIG 104 04/07/2017   HDL 58 04/07/2017   LDLCALC 59 04/07/2017   ALT 15 11/24/2018   AST 23 11/24/2018   NA 133 (L) 11/26/2018   K 3.8 11/26/2018   CL 100 11/26/2018   CREATININE 1.02  11/26/2018   BUN 11 11/26/2018   CO2 22 11/26/2018   TSH 0.538 12/30/2013   INR 1.47 11/24/2018   HGBA1C 5.3 04/07/2017      PT/INR:  Recent Labs    11/24/18 2125  LABPROT 17.7*  INR 1.47   Radiology: No results found.   Assessment/Plan: S/P Procedure(s) (LRB): HEMIARCH REPAIR OF TYPE ONE AORTIC DISSECTION WITH ANTIGRADE CEREBRAL PERFUSSION AND MODERATE HYPOTHERMIC CIRCULATORY ARREST. (N/A)  1. CV- Atrial Fibrillation overnight, currently in NSR- will increase Lopressor to 25 mg BID, continue Amiodarone drip, will start oral Amiodarone this evening 2. Pulm- no acute issues, will d/c chest tubes today, continue IS 3. Renal- creatinine is stable, good U/O, weight is stable- will d/c foley 4. Expected post operative blood loss anemia, mild Hgb at 8.4, monitor 5. CBGs controlled, patient is not a diabetic will d/c SSIP 6. D/C Foley 7. D/C Arterial line 8. Dispo- patient stable, developed Atrial Fibrillation overnight, currently in NSR will increase BB, start oral Amiodarone later today, d/c chest tubes, foley, lines, Hgb stable, continue current care     Erin Barrett 11/26/2018 8:20 AM   Patient seen and examined, agree with above Dc chest tubes, A line, Foley Transition to PO amiodarone Ambulate  Viviann Spare C. Dorris Fetch, MD [facility] [phone]

## 2018-11-26 NOTE — Op Note (Signed)
NAME: Stephen West, Stephen West MEDICAL RECORD DX:[id number] ACCOUNT [id number] DATE OF BIRTH:[birth year] FACILITY: [facility] LOCATION: [facility] PHYSICIAN:Jisell Majer Lars Pinks, MD  OPERATIVE REPORT  DATE OF PROCEDURE:  11/24/2018  PREOPERATIVE DIAGNOSIS:  Type 1 aortic dissection.  POSTOPERATIVE DIAGNOSIS:  Type 1 aortic dissection.  PROCEDURE:  Hemiarch repair of type 1 aortic dissection with moderate hypothermic circulatory arrest and antegrade cerebral perfusion.  SURGEON:  Charlett Lango, MD  ASSISTANT:  Gershon Crane, PA  ANESTHESIA:  General.  FINDINGS:  Transesophageal echocardiography showed severe left ventricular hypertrophy and a dissection flap in the ascending aorta.  No flap in the descending aorta.  Type 1 dissection with a small blood-tinged pericardial effusion.  Dissection flap  extending from just above the sinotubular junction to the arch terminating adjacent to the takeoff of the left subclavian artery.  Arch vessels not involved.  Post-bypass transesophageal echocardiography unchanged from pre-bypass study.  CLINICAL NOTE:  The patient is a 43 year old gentleman with a history of hypertension and cocaine abuse.  He presented with left neck and chest pain.  Workup revealed a type 1 aortic dissection.  He was advised to undergo emergent surgical repair.  The  indications, risks, benefits, and alternatives were discussed in detail with the patient.  He understood and accepted the risks and agreed to proceed.  DESCRIPTION OF PROCEDURE:  The patient was brought to the operating room on 11/24/2018.  Anesthesia established an arterial blood pressure line in the left radial artery.  General anesthesia was induced and the patient was intubated.  A Foley catheter  was placed.  A right radial arterial blood pressure monitoring catheter was placed.  A Swan-Ganz catheter was placed.  Transesophageal echocardiography was performed.  Findings as noted above.  The chest, abdomen and legs were  prepped and draped in the  usual sterile fashion.  A timeout was performed.  An incision was made in the right deltopectoral groove.  The dissection was carried down into the fat pad.  The axillary artery was identified.  It was lying underneath 1 limb of the brachial plexus.  No cautery was used in the  vicinity of the brachial plexus, and no traction was applied to it.  Control was obtained proximally and distally on the axillary artery and 5000 units of heparin was administered.  The aorta was clamped proximally and distally and an arteriotomy was made.   An 8 mm Gore-Tex graft was sewn end-to-side to the axillary artery with a running 5-0 Prolene suture.  After completing the anastomosis, it was deaired and connected to the bypass circuit.  CoSeal was applied around the anastomosis and the wound was  packed with gauze.  A median sternotomy was performed and the sternal retractor was placed.  It was opened gently over time.  The pericardium was incised and a bloody pericardial effusion was evacuated.  This was approximately 150 mL of fluid.  There was an obvious aortic  dissection.  There was extensive hematoma in the fat pad between the aorta and the pulmonary artery.  The remainder of the full heparin dose was given.  After confirming adequate anticoagulation with ACT measurement, a dual-stage venous cannula was  placed via a pursestring suture in the right atrium.  Cardiopulmonary bypass was initiated.  Flows were maintained per protocol.  Cooling was begun.  A left ventricular vent was placed via a pursestring suture in the right superior pulmonary vein.  A  retrograde cardioplegia cannula was placed via a pursestring suture in the right atrium and directed into  the coronary sinuses.  As the patient approached 32 degrees Celsius.  He fibrillated.  Prior to that, he did have some atrial fibrillation.  When the core  temperature reached 30 degrees Celsius, a needle was passed into the ascending  aorta.  Cardiac arrest was achieved with a combination of cold antegrade and retrograde blood cardioplegia.  There was a rapid diastolic arrest with antegrade cardioplegia.   The remainder of the cardioplegia was administered retrograde.  A liter was administered in all.  There was septal cooling to 9 degrees Celsius.  Additional cardioplegia was administered at 20-minute intervals throughout the cross-clamp period.  The aorta was  transected just above the sinotubular junction.  There was a large tear.  The tear terminated just above the sinotubular junction.  The aorta was divided even with the sinotubular junction.  The aortic valve was intact with no involvement of the leaflets  or the coronary ostia.  The aorta was sized for a 30 mm Hemashield graft.  The proximal anastomosis was performed end-to-end with the Hemashield graft to the aorta at the level of sinotubular junction.  The aorta was reinforced with Teflon felt strip.   A 4-0 running Prolene suture was used.  While the aorta was open, additional cardioplegia was administered down the right coronary ostia using a handheld cannula.  When the patient's core temperature reached 24 degrees Celsius, he was placed in  steep Trendelenburg position.  Etomidate and steroids were administered.  When the BIS reached zero,  the patient was exsanguinated and flow was stopped.  The arch was inspected.  The tear extended into the arch and terminated inferior to the takeoff of  the left subclavian artery.  It was felt that a hemiarch repair would be suitable.  The dissection did not involve the great vessels.  A clamp was placed on the innominate artery near its origin, and antegrade cerebral perfusion was administered.  There was  good backbleeding from the left common carotid and the left subclavian.  Gentle traction was applied to the vessel loop on the right axillary artery distal to the cannula to ensure that the flow was to the innominate rather than to the  arm.  Glue was  applied to the flap after the majority of the dissected arch was resected.  A piece of the Hemashield graft was cut on a bias to fit the size of the defect.  A Teflon felt strip again was used to buttress the aortic side of the anastomosis, and an  end-to-end anastomosis was performed, again with a running 4-0 Prolene suture.  At the completion of the anastomosis after tying the suture, flow was gradually restored.  As the graft deaired, the aortic cross-clamp was placed across the aortic graft,  and full flow was resumed.  The total circulatory arrest time was 41 minutes with antegrade cerebral perfusion time of 39 minutes.  The graft then was cut to length, and an end-to-end graft-to-graft anastomosis was performed with a running 4-0 Prolene  suture.  After completion of this anastamosis, a warm dose of retrograde cardioplegia was administered.  The heart and graft were deaired, and the aortic cross-clamp was removed.  The total cross-clamp time was 106 minutes.  The patient initially fibrillated  and required 3 defibrillations, but eventually resumed a spontaneous rhythm.  He was bradycardic, and epicardial pacing wires were placed on the right ventricle and right atrium.  Rewarming was begun.  Rewarming was a prolonged process.  All the  anastomoses were  inspected multiple times during the rewarming phase, and bleeding sites were sutured.  When the patient reached a core temperature of 37 degrees Celsius, DDD pacing was initiated.  He then was weaned from cardiopulmonary bypass on the  first attempt.  The total bypass time was 218 minutes.  The initial cardiac index was greater than 2 L/min per sq m.  Post-bypass transesophageal echocardiography was essentially unchanged from the pre-bypass study with severe left ventricular  hypertrophy and no significant valvular pathology.  A test dose of protamine was administered and was well tolerated.  The atrial cannula was removed.  The vent and  retrograde cardioplegia cannula had been removed prior to weaning from bypass.  It also  should be noted that carbon dioxide was insufflated into the operative field beginning with the initiation of cardiopulmonary bypass until after deairing of the graft.  After administering additional volume, the axillary graft was divided with a stapler.   Hemostasis was achieved.  There was surprisingly little in the way of coagulopathy.  The chest was copiously irrigated with warm saline.  The pericardium was reapproximated over the graft with interrupted 3-0 silk sutures.  It came together easily  without tension.  A chest tube and a Blake drain were placed into the mediastinum.  The sternum was closed with a combination of single and double heavy gauge stainless steel wires, and the sternum came together easily.  There was a decrease in the  cardiac output after sternal closure.  Repeat visualization with transesophageal echocardiography showed good systolic wall motion, but the heart was relatively underfilled.  This responded to volume resuscitation.  The pectoralis fascia, subcutaneous  tissue, and skin were closed in standard fashion.  Chest tubes were placed to suction.  The right deltopectoral groove incision was closed in 2 layers.  A subcuticular closure was used on the skin.  The patient then was taken from the operating room to  the surgical intensive care unit, intubated and in critical but stable condition.  All sponge, needle and instrument counts were correct at the end of the procedure.  LN/NUANCE  D:11/25/2018 T:11/26/2018 JOB:[id number]

## 2018-11-26 NOTE — Progress Notes (Signed)
Patient ID: [facility], male   DOB: [birth year], 43 y.o.   MRN: [id number] EVENING ROUNDS NOTE :     [address]       [place] [id number]             [phone]                 2 Days Post-Op Procedure(s) (LRB): HEMIARCH REPAIR OF TYPE ONE AORTIC DISSECTION WITH ANTIGRADE CEREBRAL PERFUSSION AND MODERATE HYPOTHERMIC CIRCULATORY ARREST. (N/A)  Total Length of Stay:  LOS: 2 days  BP 119/77   Pulse 71   Temp 98.4 F (36.9 C) (Oral)   Resp 18   Ht 6' (1.829 m)   Wt 95 kg   SpO2 91%   BMI 28.40 kg/m   .Intake/Output      02/06 0701 - 02/07 0700   P.O. 930   I.V. (mL/kg) 198.1 (2.1)   IV Piggyback    Total Intake(mL/kg) 1128.1 (11.9)   Urine (mL/kg/hr) 375 (0.3)   Chest Tube    Total Output 375   Net +753.1         . sodium chloride Stopped (11/25/18 0907)  . sodium chloride Stopped (11/25/18 0559)  . sodium chloride Stopped (11/24/18 2317)  . amiodarone 30 mg/hr (11/26/18 1900)  . lactated ringers    . lactated ringers Stopped (11/24/18 2239)  . lactated ringers Stopped (11/25/18 0945)     Lab Results  Component Value Date   WBC 19.4 (H) 11/26/2018   HGB 8.4 (L) 11/26/2018   HCT 24.4 (L) 11/26/2018   PLT 151 11/26/2018   GLUCOSE 118 (H) 11/26/2018   CHOL 138 04/07/2017   TRIG 104 04/07/2017   HDL 58 04/07/2017   LDLCALC 59 04/07/2017   ALT 15 11/24/2018   AST 23 11/24/2018   NA 133 (L) 11/26/2018   K 3.8 11/26/2018   CL 100 11/26/2018   CREATININE 1.02 11/26/2018   BUN 11 11/26/2018   CO2 22 11/26/2018   TSH 0.538 12/30/2013   INR 1.47 11/24/2018   HGBA1C 5.3 04/07/2017   Itchy uop 150 past 8 hours Will give lasix Cr stable this am    Delight Ovens MD  Beeper [phone] Office [phone] 11/26/2018 7:27 PM

## 2018-11-27 ENCOUNTER — Inpatient Hospital Stay (HOSPITAL_COMMUNITY): Payer: BLUE CROSS/BLUE SHIELD

## 2018-11-27 DIAGNOSIS — I480 Paroxysmal atrial fibrillation: Secondary | ICD-10-CM

## 2018-11-27 LAB — BASIC METABOLIC PANEL
Anion gap: 12 (ref 5–15)
BUN: 11 mg/dL (ref 6–20)
CO2: 25 mmol/L (ref 22–32)
Calcium: 8 mg/dL — ABNORMAL LOW (ref 8.9–10.3)
Chloride: 97 mmol/L — ABNORMAL LOW (ref 98–111)
Creatinine, Ser: 1.07 mg/dL (ref 0.61–1.24)
GFR calc Af Amer: >60 mL/min (ref >60)
GFR calc non Af Amer: >60 mL/min (ref >60)
GLUCOSE: 89 mg/dL (ref 70–99)
POTASSIUM: 3.3 mmol/L — AB (ref 3.5–5.1)
Sodium: 134 mmol/L — ABNORMAL LOW (ref 135–145)

## 2018-11-27 LAB — CBC
HCT: 22.1 % — ABNORMAL LOW (ref 39.0–52.0)
HEMOGLOBIN: 7.3 g/dL — AB (ref 13.0–17.0)
MCH: 29.2 pg (ref 26.0–34.0)
MCHC: 33 g/dL (ref 30.0–36.0)
MCV: 88.4 fL (ref 80.0–100.0)
Platelets: 145 10*3/uL — ABNORMAL LOW (ref 150–400)
RBC: 2.5 MIL/uL — ABNORMAL LOW (ref 4.22–5.81)
RDW: 13.6 % (ref 11.5–15.5)
WBC: 10.5 10*3/uL (ref 4.0–10.5)
nRBC: 0.3 % — ABNORMAL HIGH (ref 0.0–0.2)

## 2018-11-27 LAB — GLUCOSE, CAPILLARY: Glucose-Capillary: 96 mg/dL (ref 70–99)

## 2018-11-27 MED ORDER — POTASSIUM CHLORIDE CRYS ER 20 MEQ PO TBCR
40.0000 meq | EXTENDED_RELEASE_TABLET | ORAL | Status: AC
  Administered 2018-11-27 (×2): 40 meq via ORAL
  Filled 2018-11-27 (×2): qty 2

## 2018-11-27 MED ORDER — POTASSIUM CHLORIDE CRYS ER 20 MEQ PO TBCR
20.0000 meq | EXTENDED_RELEASE_TABLET | ORAL | Status: DC
  Administered 2018-11-27: 20 meq via ORAL
  Filled 2018-11-27: qty 1

## 2018-11-27 MED ORDER — POTASSIUM CHLORIDE CRYS ER 20 MEQ PO TBCR: 20.0000 meq | EXTENDED_RELEASE_TABLET | ORAL | Status: DC | PRN

## 2018-11-27 MED ORDER — HYDROMORPHONE HCL 2 MG PO TABS
2.0000 mg | ORAL_TABLET | ORAL | Status: DC | PRN
  Administered 2018-11-28 – 2018-11-29 (×6): 2 mg via ORAL
  Filled 2018-11-27 (×6): qty 1

## 2018-11-27 NOTE — Progress Notes (Signed)
Rt instructed pt on the use of flutter valve. Pt able to demonstrate back good technique. 

## 2018-11-27 NOTE — Progress Notes (Signed)
3 Days Post-Op Procedure(s) (LRB): HEMIARCH REPAIR OF TYPE ONE AORTIC DISSECTION WITH ANTIGRADE CEREBRAL PERFUSSION AND MODERATE HYPOTHERMIC CIRCULATORY ARREST. (N/A) Subjective: Pain reasonably well controlled, denies nausea Still has itching  Objective: Vital signs in last 24 hours: Temp:  [97.3 F (36.3 C)-98.5 F (36.9 C)] 98.4 F (36.9 C) (02/07 0740) Pulse Rate:  [68-85] 85 (02/07 0705) Cardiac Rhythm: Normal sinus rhythm (02/07 0400) Resp:  [13-28] 13 (02/07 0705) BP: (108-139)/(60-89) 139/86 (02/07 0705) SpO2:  [86 %-97 %] 93 % (02/07 0705) Arterial Line BP: (112-124)/(54-73) 112/54 (02/06 1000) Weight:  [96.9 kg] 96.9 kg (02/07 0500)  Hemodynamic parameters for last 24 hours:    Intake/Output from previous day: 02/06 0701 - 02/07 0700 In: 1467.1 [P.O.:1170; I.V.:297.1] Out: 1970 [Urine:1950; Chest Tube:20] Intake/Output this shift: No intake/output data recorded.  General appearance: alert, cooperative and no distress Neurologic: intact Heart: regular rate and rhythm Lungs: diminished breath sounds bibasilar Abdomen: normal findings: soft, non-tender Wound: clean and dry  Lab Results: Recent Labs    11/26/18 0358 11/27/18 0412  WBC 19.4* 10.5  HGB 8.4* 7.3*  HCT 24.4* 22.1*  PLT 151 145*   BMET:  Recent Labs    11/26/18 0358 11/27/18 0412  NA 133* 134*  K 3.8 3.3*  CL 100 97*  CO2 22 25  GLUCOSE 118* 89  BUN 11 11  CREATININE 1.02 1.07  CALCIUM 8.4* 8.0*    PT/INR:  Recent Labs    11/24/18 2125  LABPROT 17.7*  INR 1.47   ABG    Component Value Date/Time   PHART 7.440 11/25/2018 0644   HCO3 21.0 11/25/2018 0644   TCO2 22 11/25/2018 0644   ACIDBASEDEF 2.0 11/25/2018 0644   O2SAT 95.0 11/25/2018 0644   CBG (last 3)  Recent Labs    11/26/18 1607 11/26/18 2147 11/27/18 0649  GLUCAP 99 99 96    Assessment/Plan: S/P Procedure(s) (LRB): HEMIARCH REPAIR OF TYPE ONE AORTIC DISSECTION WITH ANTIGRADE CEREBRAL PERFUSSION AND MODERATE  HYPOTHERMIC CIRCULATORY ARREST. (N/A) Plan for transfer to step-down: see transfer orders  Doing well POD # 3 CV- in SR on metoprolol and amiodarone  BP well controlled  RESP- still with some atelectasis on CXR- will add flutter valve, continue IS  RENAL- creatinine normal. Hypokalemia- supplement K  ENDO- CBG normal- dc insulin  Anemia secondary to ABL- Hgb down at 7.3- likely just reequilibration, no evidence of bleeding. Follow  Continue cardiac rehab  Social work consult re: drug rehab options   LOS: 3 days    Loreli Slot 11/27/2018

## 2018-11-27 NOTE — Consult Note (Addendum)
Cardiology Consult    Patient ID: Stephen [facility] MRN: [id number], DOB/AGE: [birth year]   Admit date: 11/24/2018 Date of Consult: 11/27/2018  Primary Physician: Loletta Specter, PA-C Primary Cardiologist: Previously seen by Dr. Elease Hashimoto in 2015.  Requesting Provider: Dr. Dorris Fetch.  Patient Profile    Stephen West is a 43 y.o. male with a history of uncontrolled hypertension, tobacco abuse, and cocaine abuse who presented to the [facility] ED on 11/24/2018 for evaluation of chest pain and was found to have a type A aortic dissection. He underwent emergent repair. Cardiology has been asked to see the patient today to help assist in the management of his hypertension at the request of Dr. Dorris Fetch.    History of Present Illness    Stephen West is a 43 y.o. male with a history uncontrolled hypertension, tobacco abuse, and cocaine abuse. He was previously seen by Dr. Elease Hashimoto in 12/2013 for evaluation of long-standing hypertension at which time patient's reported having high blood pressure since his teenage years. Per chart review,  he was admitted to [facility] in 03/2017 for chest pain. Initial troponin was elevated at 0.13. Myoview at that time showed no ischemia or infarct but LVEF was noted to be 45% with global hypokinesis. Echocardiogram showed LVEF 55-60% with grade 2 diastolic dysfunction but no regional wall motion abnormalities. Felt to be demand ischemia due to hypertension.   Patient presented to the [facility] on 11/24/2018 for evaluation of chest pain. Per chart review, patient presented with sharp chest pain with radiation to jaw with associated shortness of breath, lightheadedness, diaphoresis, and nausea. He was found to have a type A aortic dissection. He underwent emergent repair. He had some atrial fibrillation during the procedure and was started on IV Amiodarone. He was transitioned to PO Amiodarone earlier today. Cardiology was consulted to help manage his  hypertension.  Patient is doing well at this time. He still reports mild chest and abdominal pain but is resting comfortably. Prior to this event, patient denies any chest pain since 2018 prior to this event. He reports occasional palpitations, lightheadedness, and dizziness but denies any recent shortness of breath, orthopnea, PND, or edema. He does note having a decreased appetite intermittently over the past 2 months but denies any fever, chills, or weight loss. Patient reports having high blood pressure since the age of 89. He has a blood pressure machine at home and checks it sometimes. His systolic blood pressure tends to run in the 150's and diastolic in the 90's to 100's. He is on multiple antihypertensives at home including Coreg 12.5mg  twice daily, HCTZ 25mg  daily, Lisinopril 40mg  daily, and Diltiazem daily.   Patient has a 10+ year pack year smoking history and currently smokes about a 1/2 pack per day. He also reports marijuana use and states he last used the day prior to being admitted. He denies any cocaine use to me; however, in the ED he reported using cocaine within the past 5 days. He is unsure if he has any family history of heart disease. He thinks he has a family history of stroke but is unsure who or at what age.   Past Medical History   Past Medical History:  Diagnosis Date  . Bronchitis   . Hypertension   . NSTEMI (non-ST elevated myocardial infarction) [facility])     Past Surgical History:  Procedure Laterality Date  . BENTALL PROCEDURE N/A 11/24/2018   Procedure: HEMIARCH REPAIR OF TYPE ONE AORTIC DISSECTION WITH  ANTIGRADE CEREBRAL PERFUSSION AND MODERATE HYPOTHERMIC CIRCULATORY ARREST.;  Surgeon: Loreli SlotHendrickson, Steven C, MD;  [facility];  Service: Open Heart Surgery;  Laterality: N/A;  . DENTAL SURGERY       Allergies  Allergies  Allergen Reactions  . Carrot [Daucus Carota] Anaphylaxis    Unknown  . Banana Nausea And Vomiting    Inpatient Medications    .  acetaminophen  1,000 mg Oral Q6H   Or  . acetaminophen (TYLENOL) oral liquid 160 mg/5 mL  1,000 mg Per Tube Q6H  . amiodarone  200 mg Oral BID  . aspirin EC  325 mg Oral Daily   Or  . aspirin  324 mg Per Tube Daily  . bisacodyl  10 mg Oral Daily   Or  . bisacodyl  10 mg Rectal Daily  . docusate sodium  200 mg Oral Daily  . enoxaparin (LOVENOX) injection  40 mg Subcutaneous Q24H  . mouth rinse  15 mL Mouth Rinse BID  . metoprolol tartrate  25 mg Oral BID   Or  . metoprolol tartrate  25 mg Per Tube BID  . mupirocin ointment   Nasal BID  . potassium chloride  40 mEq Oral Q4H    Family History    Family History  Problem Relation Age of Onset  . Hypertension Mother   . Diabetes Mellitus II Mother    He indicated that the status of his mother is unknown.   Social History    Social History   Socioeconomic History  . Marital status: Married    Spouse name: Not on file  . Number of children: Not on file  . Years of education: Not on file  . Highest education level: Not on file  Occupational History  . Not on file  Social Needs  . Financial resource strain: Not on file  . Food insecurity:    Worry: Not on file    Inability: Not on file  . Transportation needs:    Medical: Not on file    Non-medical: Not on file  Tobacco Use  . Smoking status: Current Every Day Smoker    Packs/day: 0.50    Types: Cigarettes  . Smokeless tobacco: Never Used  Substance and Sexual Activity  . Alcohol use: Yes    Alcohol/week: 5.0 standard drinks    Types: 5 Cans of beer per week    Comment: occ  . Drug use: Yes    Types: Marijuana, Cocaine  . Sexual activity: Not on file  Lifestyle  . Physical activity:    Days per week: Not on file    Minutes per session: Not on file  . Stress: Not on file  Relationships  . Social connections:    Talks on phone: Not on file    Gets together: Not on file    Attends religious service: Not on file    Active member of club or organization: Not  on file    Attends meetings of clubs or organizations: Not on file    Relationship status: Not on file  . Intimate partner violence:    Fear of current or ex partner: Not on file    Emotionally abused: Not on file    Physically abused: Not on file    Forced sexual activity: Not on file  Other Topics Concern  . Not on file  Social History Narrative  . Not on file     Review of Systems    Review of Systems  Constitutional:  Negative for chills and fever.  HENT: Positive for congestion. Negative for sore throat.   Respiratory: Negative for cough, hemoptysis and shortness of breath.   Cardiovascular: Positive for chest pain and palpitations. Negative for orthopnea, leg swelling and PND.  Gastrointestinal: Positive for nausea. Negative for blood in stool and vomiting.  Genitourinary: Negative for hematuria.  Musculoskeletal: Negative for falls and myalgias.  Neurological: Positive for dizziness. Negative for loss of consciousness.  Endo/Heme/Allergies: Does not bruise/bleed easily.  Psychiatric/Behavioral: Positive for substance abuse.    Physical Exam    Blood pressure 129/79, pulse 83, temperature 98.1 F (36.7 C), temperature source Oral, resp. rate 17, height 6' (1.829 m), weight 96.9 kg, SpO2 91 %.  General: 43 y.o. African-American  male resting comfortably in no acute distress. Pleasant and cooperative. HEENT: Normal  Neck: Supple. Unable to assess JVD due to bandage from central line. Lungs: No increased work of breathing. Clear to auscultation bilaterally. No wheezes, rhonchi, or rales. Heart: RRR. Distinct S1 and S2. No murmurs, gallops, or rubs. Temp wires in place.  Abdomen: Soft, non-distended, and non-tender to palpation. Bowel sounds present.  Extremities: No significant lower extremity edema. Radial and distal pedal 2+ and equal bilaterally. Skin: Warm and dry. Neuro: Alert and oriented x3. No focal deficits. Moves all extremities spontaneously. Psych: Normal  affect.  Labs    Troponin (Point of Care Test) No results for input(s): TROPIPOC in the last 72 hours. No results for input(s): CKTOTAL, CKMB, TROPONINI in the last 72 hours. Lab Results  Component Value Date   WBC 10.5 11/27/2018   HGB 7.3 (L) 11/27/2018   HCT 22.1 (L) 11/27/2018   MCV 88.4 11/27/2018   PLT 145 (L) 11/27/2018    Recent Labs  Lab 11/24/18 1021  11/27/18 0412  NA 139   < > 134*  K 3.6   < > 3.3*  CL 106   < > 97*  CO2 21*   < > 25  BUN 13   < > 11  CREATININE 1.17   < > 1.07  CALCIUM 9.0   < > 8.0*  PROT 6.7  --   --   BILITOT 0.8  --   --   ALKPHOS 65  --   --   ALT 15  --   --   AST 23  --   --   GLUCOSE 120*   < > 89   < > = values in this interval not displayed.   Lab Results  Component Value Date   CHOL 138 04/07/2017   HDL 58 04/07/2017   LDLCALC 59 04/07/2017   TRIG 104 04/07/2017   Lab Results  Component Value Date   DDIMER 0.46 07/30/2018     Radiology Studies    Dg Chest 2 View  Result Date: 11/27/2018 CLINICAL DATA:  Followup CABG EXAM: CHEST - 2 VIEW COMPARISON:  11/26/2018 FINDINGS: Mediastinal drain has been removed. Right internal jugular venous access sheath remains in place. No pneumothorax is seen. Basilar/lower lobe atelectasis persists, left more than right. IMPRESSION: No pneumothorax. Persistent lower lobe atelectasis left more than right. Electronically Signed   By: Paulina Fusi M.D.   On: 11/27/2018 07:44   Dg Chest Port 1 View  Result Date: 11/26/2018 CLINICAL DATA:  Follow-up aortic repair EXAM: PORTABLE CHEST 1 VIEW COMPARISON:  11/25/2018 FINDINGS: Postsurgical changes are again identified and stable. Cardiac shadow is mildly enlarged but stable. Mediastinal drain remains in place. Swan-Ganz catheter has been removed. Pericardial  drain is again seen and stable. Lungs demonstrate bibasilar atelectasis slightly increased when compared with the prior exam. Small effusions may be present as well although this may be more  positional in nature. No acute bony abnormality is noted. IMPRESSION: Slight increasing basilar atelectasis with suggestion of small pleural effusions. Electronically Signed   By: Alcide Clever M.D.   On: 11/26/2018 08:34   Dg Chest Port 1 View  Result Date: 11/25/2018 CLINICAL DATA:  Chest tube. EXAM: PORTABLE CHEST 1 VIEW COMPARISON:  Yesterday FINDINGS: Interval tracheal and esophageal extubation. Stable cardiomediastinal contours in this patient with repaired aortic dissection. Swan-Ganz catheter from the right with tip at the pulmonary artery outflow tract. Midline chest tube persists. No pneumothorax. Improved atelectasis, especially on the left. No pulmonary edema IMPRESSION: 1. Clearing atelectasis. 2. No visible pneumothorax. Electronically Signed   By: Marnee Spring M.D.   On: 11/25/2018 09:14   Dg Chest Port 1 View  Result Date: 11/24/2018 CLINICAL DATA:  Status post surgery for aneurysm and dissection of the ascending thoracic aorta. EXAM: PORTABLE CHEST 1 VIEW COMPARISON:  Chest CTA dated 11/24/2018 and portable chest dated 11/24/2018. FINDINGS: Interval endotracheal tube in satisfactory position. Interval nasogastric tube with its tip and side hole in the stomach. Interval median sternotomy wires and mediastinal tubes. Interval right jugular probable Swan-Ganz catheter with its tip in the proximal right ventricle. Mildly progressive enlargement of the cardiac silhouette. No pneumothorax seen. Interval left lower lobe atelectasis and mild right mid to lower lung zone atelectasis. Interval postsurgical widening of the superior mediastinum or adjacent atelectasis and obscuration of the aortic arch. Multiple small surgical clips overlying the right shoulder area. IMPRESSION: 1. Postoperative changes, as described above. 2. Interval left lower lobe atelectasis and mild right mid to lower lung zone atelectasis. Electronically Signed   By: Beckie Salts M.D.   On: 11/24/2018 21:53   Dg Chest Portable  1 View  Result Date: 11/24/2018 CLINICAL DATA:  Shortness of breath and chest pain EXAM: PORTABLE CHEST 1 VIEW COMPARISON:  July 30, 2018 FINDINGS: The lungs are clear. Heart size and pulmonary vascularity are normal. No adenopathy. No bone lesions. No pneumothorax. IMPRESSION: No edema or consolidation. Electronically Signed   By: Bretta Bang III M.D.   On: 11/24/2018 10:35   Ct Angio Chest Aorta W And/or Wo Contrast  Result Date: 11/24/2018 CLINICAL DATA:  Chest pain EXAM: CT ANGIOGRAPHY CHEST WITH CONTRAST TECHNIQUE: Multidetector CT imaging of the chest was performed using the standard protocol during bolus administration of intravenous contrast. Multiplanar CT image reconstructions and MIPs were obtained to evaluate the vascular anatomy. CONTRAST:  <See Chart> ISOVUE-370 IOPAMIDOL (ISOVUE-370) INJECTION 76% COMPARISON:  Chest radiograph, 11/24/2018 FINDINGS: Cardiovascular: Preferential opacification of the thoracic aorta. There is dissection and aneurysm of the tubular ascending thoracic aorta measuring 4.4 x 4.4 cm in caliber. There is a large fenestration which terminates the dissection flap at the proximal aortic arch and the dissection flap does not involve the arch vessels or coronary sinuses. Normal heart size. No pericardial effusion. Mediastinum/Nodes: No enlarged mediastinal, hilar, or axillary lymph nodes. Thyroid gland, trachea, and esophagus demonstrate no significant findings. Lungs/Pleura: Lungs are clear. No pleural effusion or pneumothorax. Upper Abdomen: No acute abnormality. Musculoskeletal: No chest wall abnormality. No acute or significant osseous findings. Review of the MIP images confirms the above findings. Review of the MIP images confirms the above findings. IMPRESSION: There is dissection and aneurysm of the tubular ascending thoracic aorta measuring 4.4 x 4.4  cm in caliber. There is a large fenestration which terminates the dissection flap at the proximal aortic arch and  the dissection flap does not involve the arch vessels or coronary sinuses. These results were called by telephone at the time of interpretation on 11/24/2018 at 12:52 pm to Dr. Gerhard Munch , who verbally acknowledged these results. Electronically Signed   By: Lauralyn Primes M.D.   On: 11/24/2018 12:56    EKG     EKG: EKG was personally reviewed and demonstrates: Normal sinus rhythm with significant LVH and repolarization changes.  Telemetry: Telemetry was personally reviewed and demonstrates: Sinus rhythm with heart rates in the 70's to 90's.   Cardiac Imaging    TEE 11/24/2018:  Septum: No Patent Foramen Ovale present.  Left atrium: Patent foramen ovale not present.  Aorta: The aortic root is mildly dilated at the sinus of Valsalva. The ascending aorta is moderately dilated. Aneurysm present in the ascending aorta and sinus of Valslava. Stanford type A dissection present from the aortic root to the aortic arch.  Right ventricle: Normal cavity size, wall thickness and ejection fraction.  Tricuspid valve: Trace regurgitation.  Pericardium: Small pericardial effusion adjacent to the left ventricle. Effusion is fluid.  Aorta: Graft present in the ascending aorta. Dissection present. _______________  2-D Echocardiogram 04/07/2017: Study Conclusions: - Left ventricle: The cavity size was normal. There was prominent   hypertrophy of apical LV segments. Systolic function was normal.   The estimated ejection fraction was in the range of 55% to 60%.   Wall motion was normal; there were no regional wall motion   abnormalities. Features are consistent with a pseudonormal left   ventricular filling pattern, with concomitant abnormal relaxation   and increased filling pressure (grade 2 diastolic dysfunction). - Aortic valve: There was no stenosis. - Aorta: Borderline dilated aortic root. Aortic root dimension: 37   mm (ED). - Mitral valve: There was no significant regurgitation. - Right  ventricle: The cavity size was normal. Systolic function   was mildly reduced. - Tricuspid valve: Peak RV-RA gradient (S): 17 mm Hg. - Pulmonary arteries: PA peak pressure: 20 mm Hg (S). - Inferior vena cava: The vessel was normal in size. The   respirophasic diameter changes were in the normal range (>= 50%),   consistent with normal central venous pressure.  Impressions: - Normal LV size with LV hypertrophy pattern consistent with apical   hypertrophic cardiomyopathy. Moderate diastolic dysfunction. EF   55-60%. Normal RV size with mildly decreased systolic function.   No significant valvular abnormalities.  Assessment & Plan    Hypertension - Patient presented with chest pain and was found to have a type A aortic dissection. Cardiology consulted today to help with management of patient's hypertension. Patient reports having hypertension since he was 43 years old. Systolic BP usually in the 150's and diastolic usually in the 90's to 100's. Patient previously on Coreg 12.5mg  twice daily, HCTZ 25mg  daily, Lisinopril 40mg  daily, and Diltiazem 240mg  daily at home.  - BP currently well controlled at 108/68 but has been as high as 142/92 at times over the last day. - Currently only on Lopressor 25mg  twice daily. He has not needed any PRN medications since 11/25/2018. - Consider transitioning back to home Coreg for added BP control and maximizing dose as tolerated to minimize shear forces. Could then add back home ACEi or ARB if needed.  - Discussed importance of limiting sodium intake.   Peri-Operative Atrial Fibrillation - Patient went into atrial  fibrillation during procedure and was started on IV Amiodarone. Reviewed prior EKGs and there is no evidence of any prior atrial fibrillation. Patient does not occasional palpitations.  - Currently maintaining sinus rhythm on telemetry with heart rates in the 70's to 90's.  - Currently on Amiodarone 200mg  twice daily. Continue for 3 months. -  Continue beta-blocker as above. - CHADS-VASc = 1-2 (hypertension, aortic dissection). Currently not on any anticoagulation. Will discuss whether long-term anticoagulation is necessary when felt safe from CT surgery standpoint with MD.  Polysubstance Abuse - Patient has a 10+ pack year smoking history and currently smokes 1/2 pack per day. - Patient also reports marijuana use and states he last used 1 day prior to admission. - Patient denies any cocaine use to me but reported use within 5 days in the ED.  - Complete cessation is advised.   Will arrange for follow-up in our office.   Signed, Corrin Parkerallie E Goodrich, PA-C 11/27/2018, 1:49 PM  For questions or updates, please contact   Please consult [email or web address] for contact info under Cardiology/STEMI.  Attending Note:   The patient was seen and examined.  Agree with assessment and plan as noted above.  Changes made to the above note as needed.  Patient seen and independently examined with  Marjie Skiffallie Goodrich, PA .   We discussed all aspects of the encounter. I agree with the assessment and plan as stated above.  1.   HTN:   Consider changing metoprolol to Coreg or perhaps Labetolol Overall , he seems to be making good progress.  2. PAF:   Had transient PAF following surgery for his aortic dissection. Has maintained NSR  Plan on continuing amio for 3 months and then DC   I have spent a total of 40 minutes with patient reviewing [facility]  notes , telemetry, EKGs, labs and examining patient as well as establishing an assessment and plan that was discussed with the patient. > 50% of time was spent in direct patient care.    Vesta MixerPhilip J. Mickey Hebel, Montez HagemanJr., MD, [facility] HospitalFACC 11/29/2018, 9:30 PM [address],  [address] Office [phone] Pager [phone]

## 2018-11-27 NOTE — Progress Notes (Signed)
CARDIAC REHAB PHASE I   PRE:  Rate/Rhythm: 84 SR  BP:  Sitting: 129/79      SaO2: 96 RA  MODE:  Ambulation: 270 ft   POST:  Rate/Rhythm: 96 SR  BP:  Sitting: 130/88    SaO2: 98 RA   Pt ambulated 241ft in hallway, pt declined use of walker during first half of walk, but tired quickly. Pt used walker second half of walk with several standing rest breaks c/o "tiredness" and LLQ pain. Spoke with pt and family about importance of blood pressure control and adherence to meds. Pt admits to not taking his meds regularly. Pt returned to recliner, call bell and phone within reach. Will continue to follow.  [id number] Reynold Bowen, RN BSN 11/27/2018 11:46 AM

## 2018-11-27 NOTE — Clinical Social Work Note (Signed)
Clinical Social Work Assessment  Patient Details  Name: Stephen West MRN: [id number] Date of Birth: 06/26/1976  Date of referral:  11/27/18               Reason for consult:  Community Resources, Substance Use/ETOH Abuse, Family Concerns                Permission sought to share information with:  Family Supports Permission granted to share information::  No  Name::        Agency::     Relationship::     Contact Information:     Housing/Transportation Living arrangements for the past 2 months:  Single Family Home Source of Information:  Patient Patient Interpreter Needed:  None Criminal Activity/Legal Involvement Pertinent to Current Situation/Hospitalization:  No - Comment as needed Significant Relationships:  Spouse Lives with:  Spouse Do you feel safe going back to the place where you live?  Yes(but patient states it is a stressful environment) Need for family participation in patient care:  Yes (Comment)  Care giving concerns:  CSW received consult for substance and alcohol abuse.    Social Worker assessment / plan:  CSW  Spoke with the patient at bedside. He was alert and oriented.  Patient expressed he was stressed mainly about his marriage and home environment. CSW talked with the patient about identifying stress related triggers and seeking ways to cope with stress. He admits to marijuana and cocaine use. Patient states he can stop using cocaine. However, smoking and marijuana is more difficult for him to stop. Patient also states he drinks alcohol consuming mainly beer. CSW explored with the patient ways to decrease his usage which could lead to quitting.  Patient states he would like to be drug free and sober. CSW encouraged the patient to review resources and seek appropriate help.  CSW provided the patient with inpatient and outpatient substance abuse and Mental Health resources.   Employment status:  Technical brewer:  Managed Care PT Recommendations:   Not assessed at this time Information / Referral to community resources:  Residential Substance Abuse Treatment Options, SBIRT, Outpatient Substance Abuse Treatment Options  Patient/Family's Response to care:  Patient expressed appreciation of CSW assistance.   Patient/Family's Understanding of and Emotional Response to Diagnosis, Current Treatment, and Prognosis: Patient expressed understanding of CSW role and discharge process as well as medical condition. No questions/concerns about plan or treatment at this time.   Emotional Assessment Appearance:  Appears older than stated age Attitude/Demeanor/Rapport:  Engaged Affect (typically observed):  Appropriate, Accepting, Calm, Pleasant, Hopeful Orientation:  Oriented to Self, Oriented to Place, Oriented to  Time, Oriented to Situation Alcohol / Substance use:  Illicit Drugs Psych involvement (Current and /or in the community):  No (Comment)  Discharge Needs  Concerns to be addressed:  Coping/Stress Concerns, Substance Abuse Concerns, Mental Health Concerns, Care Coordination Readmission within the last 30 days:  No Current discharge risk:  None Barriers to Discharge:  Continued Medical Work up   [employer], LCSWA 11/27/2018, 5:51 PM

## 2018-11-28 DIAGNOSIS — I10 Essential (primary) hypertension: Secondary | ICD-10-CM

## 2018-11-28 LAB — CBC
HCT: 23 % — ABNORMAL LOW (ref 39.0–52.0)
Hemoglobin: 7.9 g/dL — ABNORMAL LOW (ref 13.0–17.0)
MCH: 30 pg (ref 26.0–34.0)
MCHC: 34.3 g/dL (ref 30.0–36.0)
MCV: 87.5 fL (ref 80.0–100.0)
Platelets: 189 10*3/uL (ref 150–400)
RBC: 2.63 MIL/uL — ABNORMAL LOW (ref 4.22–5.81)
RDW: 13.5 % (ref 11.5–15.5)
WBC: 9.1 10*3/uL (ref 4.0–10.5)
nRBC: 0.3 % — ABNORMAL HIGH (ref 0.0–0.2)

## 2018-11-28 LAB — BASIC METABOLIC PANEL WITH GFR
Anion gap: 12 (ref 5–15)
BUN: 9 mg/dL (ref 6–20)
CO2: 24 mmol/L (ref 22–32)
Calcium: 8.5 mg/dL — ABNORMAL LOW (ref 8.9–10.3)
Chloride: 102 mmol/L (ref 98–111)
Creatinine, Ser: 1.07 mg/dL (ref 0.61–1.24)
GFR calc Af Amer: >60 mL/min
GFR calc non Af Amer: >60 mL/min
Glucose, Bld: 93 mg/dL (ref 70–99)
Potassium: 3.8 mmol/L (ref 3.5–5.1)
Sodium: 138 mmol/L (ref 135–145)

## 2018-11-28 LAB — BPAM RBC
Blood Product Expiration Date: 202002252359
Blood Product Expiration Date: 202002252359
Blood Product Expiration Date: 202002252359
Blood Product Expiration Date: 202002262359
ISSUE DATE / TIME: 202002041459
ISSUE DATE / TIME: 202002041459
Unit Type and Rh: 7300
Unit Type and Rh: 7300
Unit Type and Rh: 7300
Unit Type and Rh: 7300

## 2018-11-28 LAB — TYPE AND SCREEN
ABO/RH(D): B POS
Antibody Screen: NEGATIVE
Unit division: 0
Unit division: 0
Unit division: 0
Unit division: 0

## 2018-11-28 MED ORDER — LISINOPRIL 10 MG PO TABS
20.0000 mg | ORAL_TABLET | Freq: Every day | ORAL | Status: DC
  Administered 2018-11-28: 20 mg via ORAL
  Filled 2018-11-28: qty 2

## 2018-11-28 MED ORDER — CARVEDILOL 12.5 MG PO TABS: 12.5000 mg | ORAL_TABLET | Freq: Two times a day (BID) | ORAL | Status: DC

## 2018-11-28 MED ORDER — POTASSIUM CHLORIDE CRYS ER 20 MEQ PO TBCR
20.0000 meq | EXTENDED_RELEASE_TABLET | Freq: Once | ORAL | Status: AC
  Administered 2018-11-28: 20 meq via ORAL
  Filled 2018-11-28: qty 1

## 2018-11-28 MED ORDER — CARVEDILOL 12.5 MG PO TABS
12.5000 mg | ORAL_TABLET | Freq: Two times a day (BID) | ORAL | Status: DC
  Administered 2018-11-28 – 2018-11-29 (×3): 12.5 mg via ORAL
  Filled 2018-11-28 (×3): qty 1

## 2018-11-28 NOTE — Discharge Instructions (Signed)

## 2018-11-28 NOTE — Progress Notes (Signed)
CARDIAC REHAB PHASE I   1355 - 1445  Pt just woke up from nap. Pt was in visual pain at sternal site. C/o SOB while talking. Pt educated on sternal precautions and infection prevention. Pt educated on diet [facility]), restrictions, and exercise. Pt educated on smoking cessation and given resources for help. Pt referred to [facility] at [facility]. Pt voices understanding.   Alisia Ferrari, MS 11/28/2018 2:32 PM

## 2018-11-28 NOTE — Plan of Care (Signed)
Care plans reviewed and patient is progressing.  

## 2018-11-28 NOTE — Progress Notes (Addendum)
[address]       [place] [address]             [phone]        4 Days Post-Op Procedure(s) (LRB): HEMIARCH REPAIR OF TYPE ONE AORTIC DISSECTION WITH ANTIGRADE CEREBRAL PERFUSSION AND MODERATE HYPOTHERMIC CIRCULATORY ARREST. (N/A)  Subjective: Patient requesting iv from right arm be removed as it is bothering him.  Objective: Vital signs in last 24 hours: Temp:  [97.4 F (36.3 C)-98.7 F (37.1 C)] 98.7 F (37.1 C) (02/07 1944) Pulse Rate:  [73-90] 90 (02/07 1944) Cardiac Rhythm: Normal sinus rhythm (02/08 0700) Resp:  [11-18] 11 (02/07 1944) BP: (108-144)/(68-97) 144/97 (02/07 1944) SpO2:  [90 %-94 %] 94 % (02/07 1944) Weight:  [95 kg] 95 kg (02/08 0500)    Pre op weight 97.5 kg Current Weight  11/28/18 95 kg      Intake/Output from previous day: 02/07 0701 - 02/08 0700 In: 400 [P.O.:400] Out: 420 [Urine:420]   Physical Exam:  Cardiovascular: RRR Pulmonary: Slightly diminished at bases Abdomen: Soft, non tender, bowel sounds present. Extremities: Trace bilateral lower extremity edema. Feet warm Wounds: Sternal and right axillary wounds are clean and dry.  No erythema or signs of infection.  Lab Results: CBC: Recent Labs    11/27/18 0412 11/28/18 0323  WBC 10.5 9.1  HGB 7.3* 7.9*  HCT 22.1* 23.0*  PLT 145* 189   BMET:  Recent Labs    11/27/18 0412 11/28/18 0323  NA 134* 138  K 3.3* 3.8  CL 97* 102  CO2 25 24  GLUCOSE 89 93  BUN 11 9  CREATININE 1.07 1.07  CALCIUM 8.0* 8.5*    PT/INR:  Lab Results  Component Value Date   INR 1.47 11/24/2018   INR 0.96 11/24/2018   ABG:  INR: Will add last result for INR, ABG once components are confirmed Will add last 4 CBG results once components are confirmed  Assessment/Plan:  1. CV - Peri op a fib. SR in the 90's this am. SBP 140's to 150's this am. On Lopressor 25 mg bid and Amiodarone 200 mg bid. Will restart Lisinopril and as discussed with Dr. Roxan Hockey, will  change Lopressor to Coreg as he was on PTA. 2.  Pulmonary - On room air. Encourage incentive spirometer. 3. Supplement potassium 4.  Acute blood loss anemia - H and H stable at 7.9 and 23 this am 5. History of polysubstance abuse-cessation already discussed 6. Mild thrombocytopenia resolved as platelets up to 189,000 7. Possible discharge 1-2 days  Sharalyn Ink ZimmermanPA-C 11/28/2018,8:42 AM [phone]  Patient seen and examined, agree with above Dc pacing wires. He had questions about rehab options. He does not have a medical need for rehab.  Case manager met with him re: substance abuse options yesterday  Remo Lipps C. Roxan Hockey, MD [facility] [phone]

## 2018-11-28 NOTE — Progress Notes (Signed)
Progress Note  Patient Name: Stephen West Date of Encounter: 11/28/2018  Primary Cardiologist: Nahser  Subjective   Some chest wall pain with movment  Inpatient Medications    Scheduled Meds: . acetaminophen  1,000 mg Oral Q6H   Or  . acetaminophen (TYLENOL) oral liquid 160 mg/5 mL  1,000 mg Per Tube Q6H  . amiodarone  200 mg Oral BID  . aspirin EC  325 mg Oral Daily   Or  . aspirin  324 mg Per Tube Daily  . bisacodyl  10 mg Oral Daily   Or  . bisacodyl  10 mg Rectal Daily  . carvedilol  12.5 mg Oral BID WC  . docusate sodium  200 mg Oral Daily  . enoxaparin (LOVENOX) injection  40 mg Subcutaneous Q24H  . lisinopril  20 mg Oral Daily  . mouth rinse  15 mL Mouth Rinse BID  . mupirocin ointment   Nasal BID   Continuous Infusions:  PRN Meds: ALPRAZolam, hydrALAZINE, HYDROmorphone, hydrOXYzine, labetalol, ondansetron (ZOFRAN) IV, traMADol   Vital Signs    Vitals:   11/27/18 1944 11/28/18 0500 11/28/18 1056 11/28/18 1100  BP: (!) 144/97  (!) 157/102 (!) 157/102  Pulse: 90   87  Resp: 11  20 20   Temp: 98.7 F (37.1 C)  97.6 F (36.4 C)   TempSrc: Oral  Oral   SpO2: 94%  97% 94%  Weight:  95 kg    Height:        Intake/Output Summary (Last 24 hours) at 11/28/2018 1336 Last data filed at 11/28/2018 0930 Gross per 24 hour  Intake 640 ml  Output -  Net 640 ml   Last 3 Weights 11/28/2018 11/27/2018 11/25/2018  Weight (lbs) 209 lb 8 oz 213 lb 10 oz 209 lb 7 oz  Weight (kg) 95.029 kg 96.9 kg 95 kg  Some encounter information is confidential and restricted. Go to Review Flowsheets activity to see all data.      Telemetry    SR - Personally Reviewed  ECG    na  Physical Exam   GEN: No acute distress.   Neck: No JVD Cardiac: RRR, no murmurs, rubs, or gallops.  Respiratory: Clear to auscultation bilaterally. GI: Soft, nontender, non-distended  MS: No edema; No deformity. Neuro:  Nonfocal  Psych: Normal affect   Labs    Chemistry Recent Labs  Lab  11/24/18 1021  11/26/18 0358 11/27/18 0412 11/28/18 0323  NA 139   < > 133* 134* 138  K 3.6   < > 3.8 3.3* 3.8  CL 106   < > 100 97* 102  CO2 21*   < > 22 25 24   GLUCOSE 120*   < > 118* 89 93  BUN 13   < > 11 11 9   CREATININE 1.17   < > 1.02 1.07 1.07  CALCIUM 9.0   < > 8.4* 8.0* 8.5*  PROT 6.7  --   --   --   --   ALBUMIN 3.9  --   --   --   --   AST 23  --   --   --   --   ALT 15  --   --   --   --   ALKPHOS 65  --   --   --   --   BILITOT 0.8  --   --   --   --   GFRNONAA >60   < > >60 >60 >60  GFRAA >60   < > >60 >60 >60  ANIONGAP 12   < > 11 12 12    < > = values in this interval not displayed.     Hematology Recent Labs  Lab 11/26/18 0358 11/27/18 0412 11/28/18 0323  WBC 19.4* 10.5 9.1  RBC 2.76* 2.50* 2.63*  HGB 8.4* 7.3* 7.9*  HCT 24.4* 22.1* 23.0*  MCV 88.4 88.4 87.5  MCH 30.4 29.2 30.0  MCHC 34.4 33.0 34.3  RDW 13.8 13.6 13.5  PLT 151 145* 189    Cardiac Enzymes Recent Labs  Lab 11/24/18 1021  TROPONINI <0.03   No results for input(s): TROPIPOC in the last 168 hours.   BNP Recent Labs  Lab 11/24/18 1107  BNP 28.3     DDimer No results for input(s): DDIMER in the last 168 hours.   Radiology    Dg Chest 2 View  Result Date: 11/27/2018 CLINICAL DATA:  Followup CABG EXAM: CHEST - 2 VIEW COMPARISON:  11/26/2018 FINDINGS: Mediastinal drain has been removed. Right internal jugular venous access sheath remains in place. No pneumothorax is seen. Basilar/lower lobe atelectasis persists, left more than right. IMPRESSION: No pneumothorax. Persistent lower lobe atelectasis left more than right. Electronically Signed   By: Paulina FusiMark  Shogry M.D.   On: 11/27/2018 07:44    Cardiac Studies     Patient Profile     Stephen West is a 43 y.o. male with a history of uncontrolled hypertension, tobacco abuse, and cocaine abuse who [facility] for evaluation of chest pain and was found to have a type A aortic dissection. He underwent  emergent repair. Cardiology has been asked to see the patient today to help assist in the management of his hypertension at the request of Dr. Dorris FetchHendrickson.    Assessment & Plan    1. Type A dissection - history of HTN and cocaine abuse presneted with dissectoin - s/p emergent repair - post op care per CT surgery  2. HTN - currently on coreg 12.5mg  bid, lisinopril 20mg . Beta blocker in setting of prior cocaine use is debated, given his dissection and afib would favor continued use of the nonspecific coreg he is on - coreg and lisinopril restarted today, follow bp's today. Room to titrate both as needed, if 3rd med needed would start chlorthalidone.   3. Postop afib - on amio,coreg.  - converted to SR on amio. CHADS2Vasc score is 1. With transient afib and low risk score would not start anticoag at this time   We will follow telemetry and vitals tomorrow.  For questions or updates, please contact [facility] Please consult [email or web address] for contact info under        Signed, Dina RichBranch, Jonathan, MD  11/28/2018, 1:36 PM

## 2018-11-29 DIAGNOSIS — I7101 Dissection of thoracic aorta: Principal | ICD-10-CM

## 2018-11-29 MED ORDER — LISINOPRIL 40 MG PO TABS
40.0000 mg | ORAL_TABLET | Freq: Every day | ORAL | Status: DC
  Administered 2018-11-29 – 2018-11-30 (×2): 40 mg via ORAL
  Filled 2018-11-29 (×2): qty 1

## 2018-11-29 MED ORDER — CARVEDILOL 25 MG PO TABS
25.0000 mg | ORAL_TABLET | Freq: Two times a day (BID) | ORAL | Status: DC
  Administered 2018-11-29 – 2018-11-30 (×2): 25 mg via ORAL
  Filled 2018-11-29 (×2): qty 1

## 2018-11-29 MED ORDER — CLONIDINE HCL 0.2 MG PO TABS
0.2000 mg | ORAL_TABLET | Freq: Two times a day (BID) | ORAL | Status: DC
  Administered 2018-11-29 – 2018-11-30 (×3): 0.2 mg via ORAL
  Filled 2018-11-29 (×3): qty 1

## 2018-11-29 NOTE — Progress Notes (Addendum)
      [address]       [place] [address]             [phone]        5 Days Post-Op Procedure(s) (LRB): HEMIARCH REPAIR OF TYPE ONE AORTIC DISSECTION WITH ANTIGRADE CEREBRAL PERFUSSION AND MODERATE HYPOTHERMIC CIRCULATORY ARREST. (N/A)  Subjective: Patient walked entire hallway without walker earlier this am. He has no complaints at this time.  Objective: Vital signs in last 24 hours: Temp:  [97.6 F (36.4 C)-98.3 F (36.8 C)] 97.7 F (36.5 C) (02/09 0800) Pulse Rate:  [82-87] 82 (02/09 0530) Cardiac Rhythm: Normal sinus rhythm (02/08 2100) Resp:  [14-39] 24 (02/09 0800) BP: (135-175)/(85-114) 150/100 (02/09 0800) SpO2:  [94 %-97 %] 95 % (02/09 0800)    Pre op weight 97.5 kg Current Weight  11/28/18 95 kg      Intake/Output from previous day: 02/08 0701 - 02/09 0700 In: 1200 [P.O.:1200] Out: 625 [Urine:625]   Physical Exam:  Cardiovascular: RRR Pulmonary: Slightly diminished at bases Abdomen: Soft, non tender, bowel sounds present. Extremities: Trace bilateral lower extremity edema. Feet warm Wounds: Sternal and right axillary wounds are clean and dry.  No erythema or signs of infection.  Lab Results: CBC: Recent Labs    11/27/18 0412 11/28/18 0323  WBC 10.5 9.1  HGB 7.3* 7.9*  HCT 22.1* 23.0*  PLT 145* 189   BMET:  Recent Labs    11/27/18 0412 11/28/18 0323  NA 134* 138  K 3.3* 3.8  CL 97* 102  CO2 25 24  GLUCOSE 89 93  BUN 11 9  CREATININE 1.07 1.07  CALCIUM 8.0* 8.5*    PT/INR:  Lab Results  Component Value Date   INR 1.47 11/24/2018   INR 0.96 11/24/2018   ABG:  INR: Will add last result for INR, ABG once components are confirmed Will add last 4 CBG results once components are confirmed  Assessment/Plan:  1. CV - Peri op a fib. SR in the 90's this am. SBP 150's this am. On Coreg 12.5 mg bid, Amiodarone 200 mg bid, and Lisinopril 20 mg daily. Will increase Lisinopril to 40 mg daily as taken PTA and start  Catapres orally (as discussed with Dr. Dorris Fetch). 2.  Pulmonary - On room air. Encourage incentive spirometer. 3.  Acute blood loss anemia - H and H stable at 7.9 and 23 this am 4. History of polysubstance abuse-cessation already discussed 5. EPW ordered out yesterday;nurse will take out this am 6. Will discharge once BP under better control  Donielle M ZimmermanPA-C 11/29/2018,8:47 AM [phone]  Patient seen and examined, agree with above Having more pain today after coughing spell Sternum is stable and incision looks good BP med adjustments as above hypertensive currently- will give labetalol IV  Salvatore Decent. Dorris Fetch, MD [facility] [phone]

## 2018-11-29 NOTE — Plan of Care (Signed)
Care plans reviewed and patient is progressing.  

## 2018-11-30 ENCOUNTER — Encounter: Payer: Self-pay | Admitting: *Deleted

## 2018-11-30 MED ORDER — AMIODARONE HCL 200 MG PO TABS: 200.0000 mg | ORAL_TABLET | Freq: Two times a day (BID) | ORAL | 1 refills | Status: DC

## 2018-11-30 MED ORDER — HYDROCOD POLST-CPM POLST ER 10-8 MG/5ML PO SUER: 5.0000 mL | Freq: Four times a day (QID) | ORAL | Status: DC | PRN

## 2018-11-30 MED ORDER — GUAIFENESIN ER 600 MG PO TB12
1200.0000 mg | ORAL_TABLET | Freq: Two times a day (BID) | ORAL | Status: DC
  Administered 2018-11-30: 1200 mg via ORAL
  Filled 2018-11-30: qty 2

## 2018-11-30 MED ORDER — CARVEDILOL 25 MG PO TABS: 25.0000 mg | ORAL_TABLET | Freq: Two times a day (BID) | ORAL | 3 refills | Status: DC

## 2018-11-30 MED ORDER — CLONIDINE HCL 0.2 MG PO TABS: 0.2000 mg | ORAL_TABLET | Freq: Two times a day (BID) | ORAL | 3 refills | Status: DC

## 2018-11-30 MED ORDER — HYDROMORPHONE HCL 2 MG PO TABS: 2.0000 mg | ORAL_TABLET | ORAL | 0 refills | Status: DC | PRN

## 2018-11-30 MED ORDER — ASPIRIN 325 MG PO TBEC: 325.0000 mg | DELAYED_RELEASE_TABLET | Freq: Every day | ORAL | 0 refills | Status: DC

## 2018-11-30 MED FILL — Heparin Sodium (Porcine) Inj 1000 Unit/ML: INTRAMUSCULAR | Qty: 10 | Status: AC

## 2018-11-30 MED FILL — Potassium Chloride Inj 2 mEq/ML: INTRAVENOUS | Qty: 40 | Status: AC

## 2018-11-30 MED FILL — Electrolyte-R (PH 7.4) Solution: INTRAVENOUS | Qty: 4000 | Status: AC

## 2018-11-30 MED FILL — Sodium Bicarbonate IV Soln 8.4%: INTRAVENOUS | Qty: 50 | Status: AC

## 2018-11-30 MED FILL — Sodium Chloride IV Soln 0.9%: INTRAVENOUS | Qty: 2000 | Status: AC

## 2018-11-30 MED FILL — Mannitol IV Soln 20%: INTRAVENOUS | Qty: 500 | Status: AC

## 2018-11-30 MED FILL — Heparin Sodium (Porcine) Inj 1000 Unit/ML: INTRAMUSCULAR | Qty: 30 | Status: AC

## 2018-11-30 MED FILL — Heparin Sodium (Porcine) Inj 1000 Unit/ML: INTRAMUSCULAR | Qty: 2500 | Status: AC

## 2018-11-30 MED FILL — Magnesium Sulfate Inj 50%: INTRAMUSCULAR | Qty: 10 | Status: AC

## 2018-11-30 MED FILL — Lidocaine HCl(Cardiac) IV PF Soln Pref Syr 100 MG/5ML (2%): INTRAVENOUS | Qty: 5 | Status: AC

## 2018-11-30 NOTE — Progress Notes (Signed)
6 Days Post-Op Procedure(s) (LRB): HEMIARCH REPAIR OF TYPE ONE AORTIC DISSECTION WITH ANTIGRADE CEREBRAL PERFUSSION AND MODERATE HYPOTHERMIC CIRCULATORY ARREST. (N/A) Subjective: Primary complaint is pain associated with his frequent non-productive cough.  He is otherwise doing well, is independent with mobility and having appropriate bowel function.  BP has improved but MAP this AM still >162mmHg.  Objective: Vital signs in last 24 hours: Temp:  [97.5 F (36.4 C)-97.8 F (36.6 C)] 97.8 F (36.6 C) (02/10 0449) Pulse Rate:  [78-92] 80 (02/10 0449) Cardiac Rhythm: Normal sinus rhythm (02/09 2200) Resp:  [11-69] 17 (02/10 0449) BP: (86-187)/(52-118) 128/86 (02/10 0449) SpO2:  [95 %-100 %] 100 % (02/10 0449) Weight:  [92.4 kg] 92.4 kg (02/10 0331)  Hemodynamic parameters for last 24 hours:    Intake/Output from previous day: 02/09 0701 - 02/10 0700 In: 1561 [P.O.:1561] Out: 540 [Urine:540] Intake/Output this shift: No intake/output data recorded.  General appearance: Alert, cooperative, mild distress related to sternal pain that is ocurring with cough. Heart: regular rate and rhythm and good peripheral perfusion. Lungs: Breath sounds clear. Abdomen: Soft and non-tender. Extremities: All well perfused with no edema. Wound: Sternal incsion well approximated, clean and dry. Sternum is stable.  Lab Results: Recent Labs    11/28/18 0323  WBC 9.1  HGB 7.9*  HCT 23.0*  PLT 189   BMET:  Recent Labs    11/28/18 0323  NA 138  K 3.8  CL 102  CO2 24  GLUCOSE 93  BUN 9  CREATININE 1.07  CALCIUM 8.5*    PT/INR: No results for input(s): LABPROT, INR in the last 72 hours. ABG    Component Value Date/Time   PHART 7.440 11/25/2018 0644   HCO3 21.0 11/25/2018 0644   TCO2 22 11/25/2018 0644   ACIDBASEDEF 2.0 11/25/2018 0644   O2SAT 95.0 11/25/2018 0644   CBG (last 3)  No results for input(s): GLUCAP in the last 72 hours.  Assessment/Plan: S/P Procedure(s)  (LRB): HEMIARCH REPAIR OF TYPE ONE AORTIC DISSECTION WITH ANTIGRADE CEREBRAL PERFUSSION AND MODERATE HYPOTHERMIC CIRCULATORY ARREST. (N/A)  1.  POD6  Hemiarch repair of Type I aortic dissection.  Overall continues to make satisfactory recovery.  Doing well with mobility.  2. Hypertension- control better on Coreg, clonidine, and lisinopril.  MAP still greater than this am while at rest. Appreciate assistance from cardiology with BP mgt.  3. Post-op a-fib.  Stable SR on oral amiodarone.  Plan to continue for 3 months. 4. Expected acute blood loss anemia- Hct trending up on labs drawn 11/28/18.   No sign of further losses.  5. History of polysubstance abuse-cessation already discussed. Inpatient and outpatient substance abuse and Mental Health resources given to patient and family by Child psychotherapist.  6. Non-productive cough-- add Mucinex and prn Tussionex.   Leary Roca, PA-C [phone] 11/30/2018

## 2018-11-30 NOTE — Progress Notes (Signed)
CARDIAC REHAB PHASE I   Reviewed d/c education with pt and family. Pt instructed to shower and monitor incisions daily. Encouraged continued IS use, walks and sternal precautions. Pt referred to CRP II [place].   [id number] Reynold Bowen, RN BSN 11/30/2018 10:43 AM

## 2018-11-30 NOTE — Care Management Note (Signed)
Case Management Note Donn Pierini RN, BSN Transitions of Care Unit 4E- RN Case Manager [phone]  Patient Details  Name: Stephen West MRN: [id number] Date of Birth: 10-17-1976  Subjective/Objective:    Pt admitted with aortic dissection s/p repair                Action/Plan: PTA pt lived at home with wife, plan to return home, CSW consulted for substance abuse resources, [facility] consulted for community f/u. No CM needs noted for transition home. Pt is seen at the [facility] for primary care  Expected Discharge Date:  11/30/18               Expected Discharge Plan:  Home/Self Care  In-House Referral:  Clinical Social Work  Discharge planning Services  CM Consult  Post Acute Care Choice:  NA Choice offered to:  NA  DME Arranged:    DME Agency:     HH Arranged:    HH Agency:     Status of Service:  Completed, signed off  If discussed at [employer] of Stay Meetings, dates discussed:    Discharge Disposition: home/self care   Additional Comments:  Darrold Span, RN 11/30/2018, 11:05 AM

## 2018-11-30 NOTE — Consult Note (Signed)
   [facility] CM Inpatient Consult   11/30/2018  JACARY REFFNER 1975/11/22 [phone]    Referral received from inpatient [facility] Care Management services due to increased risk for readmission.    Mr. Scholes has history of substance abuse and medical history of AFIB, HTN, NSTEMI.  He is s/p aortic dissection repair. Resources for substance abuse was provided by inpatient LCSW.   Went to bedside to speak with Mr. Faust and his mother. Explained [facility] Care Management for follow up for chronic disease management for HTN. Discussed telephonic RNCM follow up. He is agreeable and [facility] written consent obtained. [facility] folder provided.   Mr. Tuazon lives with his wife. Denies having any concerns with medications. Denies need for [facility] social worker follow up regarding substance abuse.  Confirmed best contact number as [phone].  He goes to [facility] for Primary Care.   Will make referral to [facility] Telephonic RNCM for follow up.    Raiford Noble, MSN-Ed, RN,BSN [facility] Liaison [phone]

## 2018-11-30 NOTE — Progress Notes (Signed)
Progress Note  Patient Name: Stephen West Date of Encounter: 11/30/2018  Primary Cardiologist: Kristeen Miss, MD   Subjective   Pt feeling better each day. He has soreness at op site but no substernal chest pain or shortness of breath. He is having cough, encouraged to use IS.   Inpatient Medications    Scheduled Meds: . amiodarone  200 mg Oral BID  . aspirin EC  325 mg Oral Daily   Or  . aspirin  324 mg Per Tube Daily  . bisacodyl  10 mg Oral Daily   Or  . bisacodyl  10 mg Rectal Daily  . carvedilol  25 mg Oral BID  . cloNIDine  0.2 mg Oral BID  . docusate sodium  200 mg Oral Daily  . enoxaparin (LOVENOX) injection  40 mg Subcutaneous Q24H  . guaiFENesin  1,200 mg Oral BID  . lisinopril  40 mg Oral Daily  . mouth rinse  15 mL Mouth Rinse BID  . mupirocin ointment   Nasal BID   Continuous Infusions:  PRN Meds: ALPRAZolam, chlorpheniramine-HYDROcodone, hydrALAZINE, HYDROmorphone, hydrOXYzine, labetalol, ondansetron (ZOFRAN) IV, traMADol   Vital Signs    Vitals:   11/29/18 1958 11/30/18 0331 11/30/18 0449 11/30/18 0750  BP: 133/83  128/86 (!) 132/102  Pulse: 87  80 78  Resp: 18  17 13   Temp: 97.6 F (36.4 C)  97.8 F (36.6 C) 97.6 F (36.4 C)  TempSrc: Oral  Oral Oral  SpO2: 98%  100% 95%  Weight:  92.4 kg    Height:  6' (1.829 m)      Intake/Output Summary (Last 24 hours) at 11/30/2018 4627 Last data filed at 11/30/2018 0800 Gross per 24 hour  Intake 1501 ml  Output 390 ml  Net 1111 ml   Last 3 Weights 11/30/2018 11/29/2018 11/29/2018  Weight (lbs) 203 lb 11.3 oz (No Data) (No Data)  Weight (kg) 92.4 kg (No Data) (No Data)  Some encounter information is confidential and restricted. Go to Review Flowsheets activity to see all data.      Telemetry    Sinus rhythm in the 80's - Personally Reviewed  ECG    Normal sinus rhythm, 91 bpm, Rightward axis, Moderate voltage criteria for LVH, may be normal variant - Personally Reviewed  Physical Exam    GEN: No acute distress.   Neck: No JVD Cardiac: RRR, no murmurs, rubs, or gallops.  Respiratory: Clear to auscultation bilaterally. GI: Soft, nontender, non-distended  MS: No edema; No deformity. Neuro:  Nonfocal  Psych: Normal affect   Labs    Chemistry Recent Labs  Lab 11/24/18 1021  11/26/18 0358 11/27/18 0412 11/28/18 0323  NA 139   < > 133* 134* 138  K 3.6   < > 3.8 3.3* 3.8  CL 106   < > 100 97* 102  CO2 21*   < > 22 25 24   GLUCOSE 120*   < > 118* 89 93  BUN 13   < > 11 11 9   CREATININE 1.17   < > 1.02 1.07 1.07  CALCIUM 9.0   < > 8.4* 8.0* 8.5*  PROT 6.7  --   --   --   --   ALBUMIN 3.9  --   --   --   --   AST 23  --   --   --   --   ALT 15  --   --   --   --   ALKPHOS 65  --   --   --   --  BILITOT 0.8  --   --   --   --   GFRNONAA >60   < > >60 >60 >60  GFRAA >60   < > >60 >60 >60  ANIONGAP 12   < > 11 12 12    < > = values in this interval not displayed.     Hematology Recent Labs  Lab 11/26/18 0358 11/27/18 0412 11/28/18 0323  WBC 19.4* 10.5 9.1  RBC 2.76* 2.50* 2.63*  HGB 8.4* 7.3* 7.9*  HCT 24.4* 22.1* 23.0*  MCV 88.4 88.4 87.5  MCH 30.4 29.2 30.0  MCHC 34.4 33.0 34.3  RDW 13.8 13.6 13.5  PLT 151 145* 189    Cardiac Enzymes Recent Labs  Lab 11/24/18 1021  TROPONINI <0.03   No results for input(s): TROPIPOC in the last 168 hours.   BNP Recent Labs  Lab 11/24/18 1107  BNP 28.3     DDimer No results for input(s): DDIMER in the last 168 hours.   Radiology    No results found.  Cardiac Studies   Intraoperative TEE 11/24/18 Result status: Final result   Septum: No Patent Foramen Ovale present.  Left atrium: Patent foramen ovale not present.  Aorta: The aortic root is mildly dilated at the sinus of Valsalva. The ascending aorta is moderately dilated. Aneurysm present in the ascending aorta and sinus of Valslava. Stanford type A dissection present from the aortic root to the aortic arch.  Right ventricle: Normal cavity  size, wall thickness and ejection fraction.  Tricuspid valve: Trace regurgitation.  Pericardium: Small pericardial effusion adjacent to the left ventricle. Effusion is fluid.  Aorta: Graft present in the ascending aorta. Dissection present.    Patient Profile     43 y.o. male with a history of uncontrolledhypertension,tobacco abuse,and cocaine abusewho presented to the [facility] ED on 11/24/2018 for evaluation of chest pain and was found to have a type A aortic dissection. He underwent emergent repair. Cardiology has been asked to see the patient today to help assist in the management of his hypertension at the request of Dr. Dorris Fetch.  Assessment & Plan    1. Type A dissection  -History of HTN and cocaine abuse presented with dissection -S/P emergent repair on 11/24/2018 -Post op care per CT surgery  2. Hypertension -BB felt to be important for BP control in the setting of aortic dissection. On non-specific Coreg used in setting of cocaine use.  -On Coreg increased to 25 mg BID last night and lisinopril increased to 40 mg yesterday.   Clonidine added yesterday.  -BP continues to be intermittently elevated, well controlled over night but up this am. Continue to monitor response to increased meds.   3. Post op Afib -maintaining sinus rhythm on amiodarone and coreg. CHA2DS2/VAS Stroke Risk Score is 1 for hypertension.  With transient post op afib and low risk score, not pursuing anticoagulation. Plan only short term use of amiodarone.    For questions or updates, please contact [facility] Please consult [email or web address] for contact info under      Signed, Berton Bon, NP  11/30/2018, 9:03 AM

## 2018-11-30 NOTE — Progress Notes (Signed)
Patient in a stable condition, discharge education reviewed with patient , he verbalized understanding, chest tube sutures removed as ordered, patient belongings at bedside, tele dc ccmd notified, patient awaiting his wife for transportation home.

## 2018-12-01 ENCOUNTER — Other Ambulatory Visit: Payer: Self-pay

## 2018-12-01 NOTE — Patient Outreach (Signed)
[facility]) Care Management  12/01/2018  HARTLEY HISLE 12/24/1975 [id number]     Transition of Care Referral  Referral Date:11/30/2018 Referral Source: [facility] Liaison Date of Admission: 11/24/2018 Diagnosis: "aortic dissection, thoracic" Date of Discharge: 11/30/2018 Facility: [facility] Insurance: [facility]    Outreach attempt # 1 to patient. Spoke with patient who reported he just took pain medicine about 20 mins ago and "it's starting to kick in." He asked to be contacted back at another time.     Plan: RN CM will make outreach attempt to patient within 3-4 business days.   Antionette Fairy, RN,BSN,CCM [facility] Care Management Telephonic Care Management Coordinator Direct Phone: [phone] Toll Free: [phone] Fax: [phone]

## 2018-12-02 ENCOUNTER — Other Ambulatory Visit: Payer: Self-pay

## 2018-12-02 NOTE — Patient Outreach (Signed)
[facility]) Care Management  [facility] Care Manager  12/02/2018   AYLIN BECKSTRAND [birth year] [id number]    Transition of Care Referral  Referral Date:11/30/2018 Referral Source: [facility] Liaison Date of Admission: 11/24/2018 Diagnosis: "aortic dissection, thoracic" Date of Discharge: 11/30/2018 Facility: [employer] [facility] Insurance: [facility]   Outreach attempt #2 to patient. Spoke with patient. He voices that he is doing fairly well.  Social: patient resides in his home along with his spouse and young son. patient was independent with ADLs/IADLs prior to recent hospital admission. He states that he was very active and is looking forward to returning to being active. No recent falls reported. Patient states he is not currently have to use any assistive device to get around.  Conditions: Per chart review, patient has PMH of HTN, polysubstance abuse, bronchitis and NSTEMI. Patient was hospitalized from 11/24/2018-11/30/2018 where he underwent thoracic aortic dissection. patient reports that this pain is controlled and denies any at present. He states most pain episodes are when he first wakes up in the mornings na attributes it to the way he sleeps. He states that he is doing breathing exercises as advised while in the hospital and using incentive spirometry. He voices that he has been walking around the house and is ready to try walking short distances outside of the home.   Appointments: Patient has MD follow up appts in place. He goes to see PCP on 12/15/2018, cardiology on 12/16/2018 and surgeon on 12/29/2018. He denies any issues with transportation.  Advance Directives: None. Declined info.  Consent: [facility] services reviewed and discussed with patient. Patient had previously agreed to [facility] services at bedside with [facility] hospital liaison and gave verbal consent again.    Medications: RN CM completed med review with patient. Per discharge summary patient is to be taking Tessalon  Pearles, Flonase, Mucinex and Potassium. He does not have those meds in the home and voices he was not given a prescription for any of them. Patient will contact MD office today and follow up regarding these meds.   Encounter Medications:  Outpatient Encounter Medications as of 12/02/2018  Medication Sig  . amiodarone (PACERONE) 200 MG tablet Take 1 tablet (200 mg total) by mouth 2 (two) times daily.  Marland Kitchen aspirin EC 325 MG EC tablet Take 1 tablet (325 mg total) by mouth daily.  . carvedilol (COREG) 25 MG tablet Take 1 tablet (25 mg total) by mouth 2 (two) times daily.  . cloNIDine (CATAPRES) 0.2 MG tablet Take 1 tablet (0.2 mg total) by mouth 2 (two) times daily.  . hydrochlorothiazide (HYDRODIURIL) 25 MG tablet Take 1 tablet (25 mg total) by mouth daily.  Marland Kitchen HYDROmorphone (DILAUDID) 2 MG tablet Take 1 tablet (2 mg total) by mouth every 4 (four) hours as needed for moderate pain or severe pain.  Marland Kitchen lisinopril (PRINIVIL,ZESTRIL) 40 MG tablet Take 1 tablet (40 mg total) by mouth daily.  Marland Kitchen atorvastatin (LIPITOR) 40 MG tablet Take 1 tablet (40 mg total) by mouth daily at 6 PM. (Patient not taking: Reported on 11/24/2018)  . benzonatate (TESSALON) 200 MG capsule Take 1 capsule (200 mg total) by mouth 2 (two) times daily as needed for cough. (Patient not taking: Reported on 11/24/2018)  . fluticasone (FLONASE) 50 MCG/ACT nasal spray Place 2 sprays into both nostrils daily. (Patient not taking: Reported on 11/24/2018)  . Guaifenesin (MUCINEX MAXIMUM STRENGTH) 1200 MG TB12 Take 1 tablet (1,200 mg total) by mouth 2 (two) times daily. (Patient not taking: Reported on  11/24/2018)  . potassium chloride SA (K-DUR,KLOR-CON) 20 MEQ tablet Take 1 tablet (20 mEq total) by mouth daily. (Patient not taking: Reported on 11/24/2018)   No facility-administered encounter medications on file as of 12/02/2018.     Functional Status:  In your present state of health, do you have any difficulty performing the following activities:  12/02/2018 11/25/2018  Hearing? N N  Vision? N N  Difficulty concentrating or making decisions? N N  Walking or climbing stairs? N N  Dressing or bathing? N N  Doing errands, shopping? N N  Preparing Food and eating ? N -  Using the Toilet? N -  In the past six months, have you accidently leaked urine? N -  Do you have problems with loss of bowel control? N -  Managing your Medications? N -  Managing your Finances? N -  Housekeeping or managing your Housekeeping? N -  Some recent data might be hidden    Fall/Depression Screening: Fall Risk  12/02/2018  Falls in the past year? 0  Number falls in past yr: 0  Injury with Fall? 0  Risk for fall due to : Medication side effect  Follow up Falls evaluation completed   PHQ 2/9 Scores 12/02/2018 05/08/2017 12/12/2014  PHQ - 2 Score 0 1 0  Some encounter information is confidential and restricted. Go to Review Flowsheets activity to see all data.    Assessment:  THN CM Care Plan Problem One     Most Recent Value  Care Plan Problem One  Patient at increased risk for readmisison to the hospital given his medical condition.  Role Documenting the Problem One  Care Management Telephonic Coordinator  Care Plan for Problem One  Active  THN Long Term Goal   Patient wll have no hospital readmission within the next 14 days.   THN Long Term Goal Start Date  12/02/18  Interventions for Problem One Long Term Goal  RN CM completed TOC assessemnt with patient and identifed potential risk factors and helped patient remove barriers. RN CM discussed with patient the importance of seeking medical attention for any new and/or worsening symptoms.   THN CM Short Term Goal #1   Patient will complete all MD follow up appts as scheduled within two weeks.  THN CM Short Term Goal #1 Start Date  12/02/18  Interventions for Short Term Goal #1  RN CM confirmed with patient he knows appts dates and times. RN CM confirmed that patient has transportation to appts.  THN CM  Short Term Goal #2   Patient will adhere to medication regimen and take all meds as prescribed within the next two weeks.  THN CM Short Term Goal #2 Start Date  12/02/18  Interventions for Short Term Goal #2  RN CM completed med review with paitent. RN CM confirmed there were meds that patient not taking but supposed to be taking and patient will follow up with MD.       Plan: RN CM provided patient with RN CM contact info and advised to all for any issues or concerns. RN CM will make follow up call to patient within a week. RN CM will send patient [facility] letter via mail. RN CM will send MD letter to PCP.  Antionette Fairy, RN,BSN,CCM [facility] Care Management Telephonic Care Management Coordinator Direct Phone: [phone] Toll Free: [phone] Fax: [phone]

## 2018-12-04 ENCOUNTER — Telehealth (HOSPITAL_COMMUNITY): Payer: Self-pay

## 2018-12-04 NOTE — Telephone Encounter (Signed)
Pt insurance is active and benefits verified through [employer] $0.00, DED $250.00/$0.00 met, out of pocket $650.00/$16.00 met, co-insurance 30%. No pre-authorization required. [employer]port, 12/04/2018 @ 8:56AM, REF# [id number]  Will contact patient to see if he is interested in the Cardiac Rehab Program. If interested, patient will need to complete follow up appt. Once completed, patient will be contacted for scheduling upon review by the RN Navigator.

## 2018-12-05 ENCOUNTER — Emergency Department (HOSPITAL_COMMUNITY)
Admission: EM | Admit: 2018-12-05 | Discharge: 2018-12-05 | Disposition: A | Discharge status: Home / Self Care | Payer: BLUE CROSS/BLUE SHIELD | Attending: Emergency Medicine | Admitting: Emergency Medicine

## 2018-12-05 ENCOUNTER — Emergency Department (HOSPITAL_COMMUNITY): Payer: BLUE CROSS/BLUE SHIELD

## 2018-12-05 ENCOUNTER — Other Ambulatory Visit: Payer: Self-pay

## 2018-12-05 DIAGNOSIS — I252 Old myocardial infarction: Secondary | ICD-10-CM | POA: Diagnosis not present

## 2018-12-05 DIAGNOSIS — I97618 Postprocedural hemorrhage and hematoma of a circulatory system organ or structure following other circulatory system procedure: Secondary | ICD-10-CM | POA: Diagnosis not present

## 2018-12-05 DIAGNOSIS — J45909 Unspecified asthma, uncomplicated: Secondary | ICD-10-CM | POA: Diagnosis not present

## 2018-12-05 DIAGNOSIS — I10 Essential (primary) hypertension: Secondary | ICD-10-CM | POA: Insufficient documentation

## 2018-12-05 DIAGNOSIS — Z7982 Long term (current) use of aspirin: Secondary | ICD-10-CM | POA: Diagnosis not present

## 2018-12-05 DIAGNOSIS — X58XXXD Exposure to other specified factors, subsequent encounter: Secondary | ICD-10-CM | POA: Diagnosis not present

## 2018-12-05 DIAGNOSIS — R58 Hemorrhage, not elsewhere classified: Secondary | ICD-10-CM

## 2018-12-05 DIAGNOSIS — Z5189 Encounter for other specified aftercare: Secondary | ICD-10-CM

## 2018-12-05 DIAGNOSIS — S31111D Laceration without foreign body of abdominal wall, left upper quadrant without penetration into peritoneal cavity, subsequent encounter: Secondary | ICD-10-CM | POA: Diagnosis not present

## 2018-12-05 DIAGNOSIS — Z79899 Other long term (current) drug therapy: Secondary | ICD-10-CM | POA: Insufficient documentation

## 2018-12-05 DIAGNOSIS — F17210 Nicotine dependence, cigarettes, uncomplicated: Secondary | ICD-10-CM | POA: Diagnosis not present

## 2018-12-05 LAB — CBC WITH DIFFERENTIAL/PLATELET
Abs Immature Granulocytes: 0.04 10*3/uL (ref 0.00–0.07)
Basophils Absolute: 0.1 10*3/uL (ref 0.0–0.1)
Basophils Relative: 1 %
Eosinophils Absolute: 0.4 10*3/uL (ref 0.0–0.5)
Eosinophils Relative: 5 %
HCT: 27.5 % — ABNORMAL LOW (ref 39.0–52.0)
Hemoglobin: 9 g/dL — ABNORMAL LOW (ref 13.0–17.0)
Immature Granulocytes: 1 %
Lymphocytes Relative: 24 %
Lymphs Abs: 1.8 10*3/uL (ref 0.7–4.0)
MCH: 29.8 pg (ref 26.0–34.0)
MCHC: 32.7 g/dL (ref 30.0–36.0)
MCV: 91.1 fL (ref 80.0–100.0)
MONOS PCT: 7 %
Monocytes Absolute: 0.6 10*3/uL (ref 0.1–1.0)
Neutro Abs: 4.9 10*3/uL (ref 1.7–7.7)
Neutrophils Relative %: 62 %
Platelets: 561 10*3/uL — ABNORMAL HIGH (ref 150–400)
RBC: 3.02 MIL/uL — ABNORMAL LOW (ref 4.22–5.81)
RDW: 14.6 % (ref 11.5–15.5)
WBC: 7.7 10*3/uL (ref 4.0–10.5)
nRBC: 0 % (ref 0.0–0.2)

## 2018-12-05 LAB — COMPREHENSIVE METABOLIC PANEL
ALT: 32 U/L (ref 0–44)
ANION GAP: 10 (ref 5–15)
AST: 21 U/L (ref 15–41)
Albumin: 2.9 g/dL — ABNORMAL LOW (ref 3.5–5.0)
Alkaline Phosphatase: 72 U/L (ref 38–126)
BUN: 9 mg/dL (ref 6–20)
CO2: 23 mmol/L (ref 22–32)
Calcium: 8.5 mg/dL — ABNORMAL LOW (ref 8.9–10.3)
Chloride: 105 mmol/L (ref 98–111)
Creatinine, Ser: 1.08 mg/dL (ref 0.61–1.24)
GFR calc Af Amer: >60 mL/min (ref >60)
GFR calc non Af Amer: >60 mL/min (ref >60)
Glucose, Bld: 121 mg/dL — ABNORMAL HIGH (ref 70–99)
Potassium: 3.6 mmol/L (ref 3.5–5.1)
Sodium: 138 mmol/L (ref 135–145)
Total Bilirubin: 0.4 mg/dL (ref 0.3–1.2)
Total Protein: 5.6 g/dL — ABNORMAL LOW (ref 6.5–8.1)

## 2018-12-05 MED ORDER — LIDOCAINE-EPINEPHRINE 1 %-1:100000 IJ SOLN
5.0000 mL | Freq: Once | INTRAMUSCULAR | Status: AC
  Administered 2018-12-05: 5 mL via INTRADERMAL
  Filled 2018-12-05: qty 5

## 2018-12-05 NOTE — ED Notes (Signed)
Patient transported to X-ray 

## 2018-12-05 NOTE — ED Provider Notes (Signed)
[facility] EMERGENCY DEPARTMENT Provider Note   CSN: [id number] Arrival date & time: 12/05/18  1114     History   Chief Complaint Chief Complaint  Patient presents with  . Post-op Problem    HPI ALWALEED HODGEN is a 43 y.o. male.  HPI   43 year old male with a history of recent Type A aortic dissection with repair on February 4 with Dr. Dorris Fetch who presents with concern for continued bleeding from the site where he reports his pacer wires were located.  Reports that the wires were pulled on February 10, just prior to discharge, and that since then he has had continued bleeding from the site.  Reports that it will slow down, however if he gets up and walks around it will start bleeding again, and reports that over the last 5 days it has continued to bleed.  He reports he had some bleeding from the right-sided site, however the left side has been more persistent.  Otherwise, he reports he is recovering well since his surgery.  Reports he does have some chest pain with cough, and some mild continuing cough.  Reports that he feels short of breath with exertion which has been present since the surgery, but denies any new or worsening shortness of breath.  Denies fevers.  Past Medical History:  Diagnosis Date  . Bronchitis   . Hypertension   . NSTEMI (non-ST elevated myocardial infarction) [facility])     Patient Active Problem List   Diagnosis Date Noted  . Aortic dissection, thoracic (HCC) 11/24/2018  . Polysubstance abuse (HCC) 04/07/2017  . Tobacco abuse 04/07/2017  . Hypertensive emergency 04/07/2017  . Hypokalemia 04/07/2017  . Hip pain 01/12/2014  . Sinus congestion 01/12/2014  . Asthma 01/06/2014  . HTN (hypertension) 12/30/2013  . Borderline hyperglycemia 12/30/2013  . Chronic generalized abdominal pain 12/30/2013  . Headache 12/30/2013  . Urinary frequency 12/30/2013  . Non-suicidal depressed mood 12/30/2013  . Tobacco abuse counseling 12/30/2013  .  Screening for hyperlipidemia 12/30/2013    Past Surgical History:  Procedure Laterality Date  . BENTALL PROCEDURE N/A 11/24/2018   Procedure: HEMIARCH REPAIR OF TYPE ONE AORTIC DISSECTION WITH ANTIGRADE CEREBRAL PERFUSSION AND MODERATE HYPOTHERMIC CIRCULATORY ARREST.;  Surgeon: Loreli Slot, MD;  Location: [facility] OR;  Service: Open Heart Surgery;  Laterality: N/A;  . DENTAL SURGERY          Home Medications    Prior to Admission medications   Medication Sig Start Date End Date Taking? Authorizing Provider  amiodarone (PACERONE) 200 MG tablet Take 1 tablet (200 mg total) by mouth 2 (two) times daily. 11/30/18  Yes Barrett, Rae Roam, PA-C  aspirin EC 325 MG EC tablet Take 1 tablet (325 mg total) by mouth daily. 11/30/18  Yes Barrett, Polo Mcmartin R, PA-C  carvedilol (COREG) 25 MG tablet Take 1 tablet (25 mg total) by mouth 2 (two) times daily. 11/30/18  Yes Barrett, Nyzir Dubois R, PA-C  cloNIDine (CATAPRES) 0.2 MG tablet Take 1 tablet (0.2 mg total) by mouth 2 (two) times daily. 11/30/18  Yes Barrett, Karli Wickizer R, PA-C  HYDROmorphone (DILAUDID) 2 MG tablet Take 1 tablet (2 mg total) by mouth every 4 (four) hours as needed for moderate pain or severe pain. 11/30/18  Yes Barrett, Jara Feider R, PA-C  atorvastatin (LIPITOR) 40 MG tablet Take 1 tablet (40 mg total) by mouth daily at 6 PM. Patient not taking: Reported on 12/05/2018 07/16/18   Loletta Specter, PA-C  benzonatate (TESSALON) 200 MG capsule  Take 1 capsule (200 mg total) by mouth 2 (two) times daily as needed for cough. Patient not taking: Reported on 11/24/2018 07/30/18   Fayrene Helperran, Bowie, PA-C  fluticasone [facility](FLONASE) 50 MCG/ACT nasal spray Place 2 sprays into both nostrils daily. Patient not taking: Reported on 11/24/2018 11/23/18   Couture, Cortni S, PA-C  Guaifenesin (MUCINEX MAXIMUM STRENGTH) 1200 MG TB12 Take 1 tablet (1,200 mg total) by mouth 2 (two) times daily. Patient not taking: Reported on 11/24/2018 07/16/18   Loletta SpecterGomez, Roger David, PA-C  hydrochlorothiazide  (HYDRODIURIL) 25 MG tablet Take 1 tablet (25 mg total) by mouth daily. 07/16/18   Loletta SpecterGomez, Roger David, PA-C  lisinopril (PRINIVIL,ZESTRIL) 40 MG tablet Take 1 tablet (40 mg total) by mouth daily. 07/16/18   Loletta SpecterGomez, Roger David, PA-C  potassium chloride SA (K-DUR,KLOR-CON) 20 MEQ tablet Take 1 tablet (20 mEq total) by mouth daily. Patient not taking: Reported on 11/24/2018 07/30/18   Fayrene Helperran, Bowie, PA-C    Family History Family History  Problem Relation Age of Onset  . Hypertension Mother   . Diabetes Mellitus II Mother     Social History Social History   Tobacco Use  . Smoking status: Current Every Day Smoker    Packs/day: 0.50    Types: Cigarettes  . Smokeless tobacco: Never Used  Substance Use Topics  . Alcohol use: Yes    Alcohol/week: 5.0 standard drinks    Types: 5 Cans of beer per week    Comment: occ  . Drug use: Yes    Types: Marijuana, Cocaine     Allergies   Carrot [daucus carota] and Banana   Review of Systems Review of Systems  Constitutional: Positive for fatigue. Negative for fever.  Eyes: Negative for visual disturbance.  Respiratory: Positive for shortness of breath (on exertion, present since surgery no change). Cough: occasional, not worsening, present since surgery.   Cardiovascular: Negative for chest pain.  Gastrointestinal: Negative for abdominal pain, nausea, rectal pain and vomiting.  Genitourinary: Negative for difficulty urinating.  Musculoskeletal: Negative for back pain and neck stiffness.  Skin: Positive for wound. Negative for rash.  Neurological: Negative for syncope and headaches.     Physical Exam Updated Vital Signs BP (!) 167/111 (BP Location: Right Arm)   Pulse 82   Temp 97.9 F (36.6 C) (Oral)   Resp 20   Ht 6' (1.829 m)   Wt 93 kg   SpO2 98%   BMI 27.80 kg/m   Physical Exam Vitals signs and nursing note reviewed.  Constitutional:      General: He is not in acute distress.    Appearance: He is well-developed. He is not  diaphoretic.  HENT:     Head: Normocephalic and atraumatic.  Eyes:     Conjunctiva/sclera: Conjunctivae normal.  Neck:     Musculoskeletal: Normal range of motion.  Cardiovascular:     Rate and Rhythm: Normal rate and regular rhythm.     Heart sounds: Normal heart sounds. No murmur. No friction rub. No gallop.      Comments: Sternal incision C/D/I Incision right upper chest C/D/I Site right superior abdomen and left superior abdomen with small hole, no surrounding erythema, no purulent discharge, signs of recent bleeding from left site Pulmonary:     Effort: Pulmonary effort is normal. No respiratory distress.     Breath sounds: Normal breath sounds. No wheezing or rales.  Abdominal:     General: There is no distension.     Palpations: Abdomen is soft.  Tenderness: There is no abdominal tenderness. There is no guarding.  Skin:    General: Skin is warm and dry.  Neurological:     Mental Status: He is alert and oriented to person, place, and time.      ED Treatments / Results  Labs (all labs ordered are listed, but only abnormal results are displayed) Labs Reviewed  CBC WITH DIFFERENTIAL/PLATELET - Abnormal; Notable for the following components:      Result Value   RBC 3.02 (*)    Hemoglobin 9.0 (*)    HCT 27.5 (*)    Platelets 561 (*)    All other components within normal limits  COMPREHENSIVE METABOLIC PANEL - Abnormal; Notable for the following components:   Glucose, Bld 121 (*)    Calcium 8.5 (*)    Total Protein 5.6 (*)    Albumin 2.9 (*)    All other components within normal limits    EKG EKG Interpretation  Date/Time:  Saturday December 05 2018 11:33:25 EST Ventricular Rate:  78 PR Interval:    QRS Duration: 96 QT Interval:  387 QTC Calculation: 441 R Axis:   81 Text Interpretation:  Sinus rhythm LVH with secondary repolarization abnormality Probable anterior infarct, age indeterminate Since prior ECG, TW inversions new, however these were present on  prior ECGs Confirmed by Alvira Monday ([id number]) on 12/05/2018 1:00:17 PM   Radiology Dg Chest 2 View  Result Date: 12/05/2018 CLINICAL DATA:  Procedure:HEMIARCH REPAIR OF TYPE ONE AORTIC DISSECTION WITH ANTIGRADE CEREBRAL PERFUSSION AND MODERATE HYPOTHERMIC CIRCULATORY ARREST Done 2/04/20Per pt x 1 week began bleeding from surgical incisions on upper anterior abdomen. Pt continues to have bleeding on and off since bypass surgery. Cough x Monday: chest pain to upper midle of chest when cough. Past smoker. Hx of HTN, NSTEMI, bronchitis. EXAM: CHEST - 2 VIEW COMPARISON:  11/27/2018 and older exams. FINDINGS: There is increased opacity at the right lung base consistent with a combination of pleural fluid and atelectasis or possibly pneumonia. Small amount of fluid noted along the minor fissure. Remainder of the lungs is clear. Cardiac silhouette is mildly enlarged. Changes from the recent median sternotomy is stable from the prior exam. No mediastinal widening. No mediastinal or hilar masses. No pneumothorax. All lines and tubes have been removed. Skeletal structures are unremarkable. IMPRESSION: 1. Interval increase in right lung base opacity from the most recent prior exam. Opacity is consistent with pleural fluid and either atelectasis or pneumonia. Left pleural effusion and atelectasis noted on the prior study has essentially resolved. 2. No evidence of pulmonary edema.  No pneumothorax. Electronically Signed   By: Amie Portland M.D.   On: 12/05/2018 12:40    Procedures .Marland KitchenLaceration Repair Date/Time: 12/05/2018 1:42 PM Performed by: Alvira Monday, MD Authorized by: Alvira Monday, MD   Consent:    Consent obtained:  Verbal   Consent given by:  Patient   Risks discussed:  Infection, need for additional repair and pain   Alternatives discussed:  No treatment Anesthesia (see MAR for exact dosages):    Anesthesia method:  Local infiltration   Local anesthetic:  Lidocaine 1% w/o epi Laceration  details:    Location:  Trunk   Trunk location:  LUQ abd   Length (cm):  0 Repair type:    Repair type:  Simple Skin repair:    Repair method:  Sutures   Suture size:  3-0   Wound skin closure material used: silk.   Suture technique:  Figure eight  Number of sutures:  1 Approximation:    Approximation:  Close   (including critical care time)  Medications Ordered in ED Medications  lidocaine-EPINEPHrine (XYLOCAINE W/EPI) 1 %-1:100000 (with pres) injection 5 mL (5 mLs Intradermal Given by Other 12/05/18 1257)     Initial Impression / Assessment and Plan / ED Course  I have reviewed the triage vital signs and the nursing notes.  Pertinent labs & imaging results that were available during my care of the patient were reviewed by me and considered in my medical decision making (see chart for details).     43 year old male with a history of recent Type A aortic dissection with repair on February 4 with Dr. Dorris FetchHendrickson who presents with concern for continued bleeding from the site where he reports his pacer wires were located.  He denies any other concerns, and has typical post-operative symptoms such as soreness with cough, fatigue.  Incisions without signs of infection, no erythema.  XR with increasing opacity however no symptoms to suggest pneumonia, no leukocytosis, and feel this pleural effusion is post operative and appropriate for continued outpatient evaluation with CT surgery.  Labs show improving hgb, no thrombocytopenia and normal electrolytes.   Discussed bleeding from site with Dr. Erenest RasherVanStory who recommends 2.0 silk figure of 8 suture to help with bleeding and outpt follow up.   Placed suture. Patient discharged in stable condition with understanding of reasons to return.   Final Clinical Impressions(s) / ED Diagnoses   Final diagnoses:  Bleeding  Encounter for wound care    ED Discharge Orders    None       Alvira MondaySchlossman, Alexius Ellington, MD 12/05/18 1343

## 2018-12-05 NOTE — ED Triage Notes (Addendum)
Pt here for evaluation of bleeding at wire removal sites since open heart surgery on 2/4. Pt sts he is having to change the dressing every morning. Denies drainage other than blood.

## 2018-12-08 ENCOUNTER — Other Ambulatory Visit: Payer: Self-pay

## 2018-12-08 NOTE — Patient Outreach (Addendum)
[facility]) Care Management  12/08/2018  Stephen West [birth year] [id number]   Transition of Care   Outreach attempt #1 to patient. Spoke with patient. He voices overall he is doing fine. He continues to have some soreness and pain. He reports that pain is generally worse at night/early morning when he is waken by it. He thinks it has something to do with him tossing and turning and moving without knowing it while he is asleep. He states that he normally wakes up around 2-3am and takes a pain pill. He has not been taking pain pill prior to going to sleep. Patient reports his normal bedtime is around 11:30 pm. RN CM encouraged patient to try taking pain pill prior to going to sleep to see if that will help. He voiced understanding. Patient states he did follow up with MD regarding issues with meds and voices issue has been resolved and he is taking all the correct meds now. He states he received letter in the mail regarding appt follow up with Dr. Dorris Fetch on 12/15/2018. He also goes to see cardiologist on 12/16/2018. Spouse will be accompanying him to appts. Per d/c summary referral placed to outpatient cardiac rehab. He states he has not heard anything from them yet. Patient has contact info and will follow up with them if he does not hear anything within the next few days. Patient shares that he went to the ED over the weekend as he was having bleeding from incision site. Patient received some stitches/sutures and states he has had no further bleeding. He is aware of s/s of infection and when to seek medical attention. Patient made aware that RN CM would follow up with him in a week and for him to call sooner if he had any needs or concerns. He voiced understanding and was appreciative of call.     Plan: RN CM will follow up with patient within one week.  Antionette Fairy, RN,BSN,CCM [facility] Care Management Telephonic Care Management Coordinator Direct Phone:  [phone] Toll Free: [phone] Fax: [phone]

## 2018-12-09 ENCOUNTER — Ambulatory Visit: Payer: Self-pay

## 2018-12-15 ENCOUNTER — Other Ambulatory Visit: Payer: Self-pay

## 2018-12-15 ENCOUNTER — Ambulatory Visit: Payer: BLUE CROSS/BLUE SHIELD | Admitting: Thoracic Surgery (Cardiothoracic Vascular Surgery)

## 2018-12-15 NOTE — Patient Outreach (Signed)
[facility]) Care Management  12/15/2018  JEDREK FIRPO 20-Apr-1976 [id number]   Transition of Care Week #2    Outreach attempt #2 to patient. No answer at present. RN CM left HIPAA compliant voicemail message along with contact info.     Plan: RN CM will make outreach attempt to patient within 3-4 business days.  Antionette Fairy, RN,BSN,CCM [facility] Care Management Telephonic Care Management Coordinator Direct Phone: [phone] Toll Free: [phone] Fax: [phone]

## 2018-12-16 ENCOUNTER — Ambulatory Visit: Payer: BLUE CROSS/BLUE SHIELD | Admitting: Physician Assistant

## 2018-12-16 NOTE — Progress Notes (Deleted)
Cardiology Office Note    Date:  12/16/2018   ID:  Stephen West, DOB 09/10/1976, MRN [id number]  PCP:  Loletta Specter, PA-C  Cardiologist:  Dr. Elease Hashimoto   Chief Complaint: West follow up   History of Present Illness:   Stephen West is a 43 y.o. male with a history of uncontrolledhypertension,tobacco abuse,and cocaine abusewho presented to the [facility] ED on 11/24/2018 for evaluation of chest pain and was found to have a type A aortic dissection. He underwent emergent repair. Cardiology seen the patient for elevated Blood pressure. Increased coreg and lisinorpil. Patient had post op atrial fibrillation. Added amiodarone short term.   Here today for follow up.     Past Medical History:  Diagnosis Date  . Bronchitis   . Hypertension   . NSTEMI (non-ST elevated myocardial infarction) [facility])     Past Surgical History:  Procedure Laterality Date  . BENTALL PROCEDURE N/A 11/24/2018   Procedure: HEMIARCH REPAIR OF TYPE ONE AORTIC DISSECTION WITH ANTIGRADE CEREBRAL PERFUSSION AND MODERATE HYPOTHERMIC CIRCULATORY ARREST.;  Surgeon: Loreli Slot, MD;  Location: [facility] OR;  Service: Open Heart Surgery;  Laterality: N/A;  . DENTAL SURGERY      Current Medications: Prior to Admission medications   Medication Sig Start Date End Date Taking? Authorizing Provider  amiodarone (PACERONE) 200 MG tablet Take 1 tablet (200 mg total) by mouth 2 (two) times daily. 11/30/18   Barrett, Rae Roam, PA-C  aspirin EC 325 MG EC tablet Take 1 tablet (325 mg total) by mouth daily. 11/30/18   Barrett, Erin R, PA-C  atorvastatin (LIPITOR) 40 MG tablet Take 1 tablet (40 mg total) by mouth daily at 6 PM. Patient not taking: Reported on 12/05/2018 07/16/18   Loletta Specter, PA-C  benzonatate (TESSALON) 200 MG capsule Take 1 capsule (200 mg total) by mouth 2 (two) times daily as needed for cough. Patient not taking: Reported on 11/24/2018 07/30/18   Fayrene Helper, PA-C  carvedilol (COREG) 25 MG  tablet Take 1 tablet (25 mg total) by mouth 2 (two) times daily. 11/30/18   Barrett, Erin R, PA-C  cloNIDine (CATAPRES) 0.2 MG tablet Take 1 tablet (0.2 mg total) by mouth 2 (two) times daily. 11/30/18   Barrett, Erin R, PA-C  fluticasone (FLONASE) 50 MCG/ACT nasal spray Place 2 sprays into both nostrils daily. Patient not taking: Reported on 11/24/2018 11/23/18   Couture, Cortni S, PA-C  Guaifenesin (MUCINEX MAXIMUM STRENGTH) 1200 MG TB12 Take 1 tablet (1,200 mg total) by mouth 2 (two) times daily. Patient not taking: Reported on 11/24/2018 07/16/18   Loletta Specter, PA-C  hydrochlorothiazide (HYDRODIURIL) 25 MG tablet Take 1 tablet (25 mg total) by mouth daily. 07/16/18   Loletta Specter, PA-C  HYDROmorphone (DILAUDID) 2 MG tablet Take 1 tablet (2 mg total) by mouth every 4 (four) hours as needed for moderate pain or severe pain. 11/30/18   Barrett, Erin R, PA-C  lisinopril (PRINIVIL,ZESTRIL) 40 MG tablet Take 1 tablet (40 mg total) by mouth daily. 07/16/18   Loletta Specter, PA-C  potassium chloride SA (K-DUR,KLOR-CON) 20 MEQ tablet Take 1 tablet (20 mEq total) by mouth daily. Patient not taking: Reported on 11/24/2018 07/30/18   Fayrene Helper, PA-C    Allergies:   Carrot [daucus carota] and Banana   Social History   Socioeconomic History  . Marital status: Married    Spouse name: Not on file  . Number of children: Not on file  . Years of  education: Not on file  . Highest education level: Not on file  Occupational History  . Not on file  Social Needs  . Financial resource strain: Not on file  . Food insecurity:    Worry: Not on file    Inability: Not on file  . Transportation needs:    Medical: Not on file    Non-medical: Not on file  Tobacco Use  . Smoking status: Current Every Day Smoker    Packs/day: 0.50    Types: Cigarettes  . Smokeless tobacco: Never Used  Substance and Sexual Activity  . Alcohol use: Yes    Alcohol/week: 5.0 standard drinks    Types: 5 Cans of beer per  week    Comment: occ  . Drug use: Yes    Types: Marijuana, Cocaine  . Sexual activity: Not on file  Lifestyle  . Physical activity:    Days per week: Not on file    Minutes per session: Not on file  . Stress: Not on file  Relationships  . Social connections:    Talks on phone: Not on file    Gets together: Not on file    Attends religious service: Not on file    Active member of club or organization: Not on file    Attends meetings of clubs or organizations: Not on file    Relationship status: Not on file  Other Topics Concern  . Not on file  Social History Narrative  . Not on file     Family History:  The patient's family history includes Diabetes Mellitus II in his mother; Hypertension in his mother. ***  ROS:   Please see the history of present illness.    ROS All other systems reviewed and are negative.   PHYSICAL EXAM:   VS:  There were no vitals taken for this visit.   GEN: Well nourished, well developed, in no acute distress  HEENT: normal  Neck: no JVD, carotid bruits, or masses Cardiac: ***RRR; no murmurs, rubs, or gallops,no edema  Respiratory:  clear to auscultation bilaterally, normal work of breathing GI: soft, nontender, nondistended, + BS MS: no deformity or atrophy  Skin: warm and dry, no rash Neuro:  Alert and Oriented x 3, Strength and sensation are intact Psych: euthymic mood, full affect  Wt Readings from Last 3 Encounters:  12/05/18 205 lb (93 kg)  11/30/18 203 lb 11.3 oz (92.4 kg)  11/23/18 215 lb (97.5 kg)      Studies/Labs Reviewed:   EKG:  EKG is ordered today.  The ekg ordered today demonstrates ***  Recent Labs: 11/24/2018: B Natriuretic Peptide 28.3 11/25/2018: Magnesium 2.9 12/05/2018: ALT 32; BUN 9; Creatinine, Ser 1.08; Hemoglobin 9.0; Platelets 561; Potassium 3.6; Sodium 138   Lipid Panel    Component Value Date/Time   CHOL 138 04/07/2017 0458   TRIG 104 04/07/2017 0458   HDL 58 04/07/2017 0458   CHOLHDL 2.4 04/07/2017 0458     VLDL 21 04/07/2017 0458   LDLCALC 59 04/07/2017 0458    Additional studies/ records that were reviewed today include:   TEE 11/24/2018  Septum: No Patent Foramen Ovale present.  Left atrium: Patent foramen ovale not present.  Aorta: The aortic root is mildly dilated at the sinus of Valsalva. The ascending aorta is moderately dilated. Aneurysm present in the ascending aorta and sinus of Valslava. Stanford type A dissection present from the aortic root to the aortic arch.  Right ventricle: Normal cavity size, wall thickness and ejection  fraction.  Tricuspid valve: Trace regurgitation.  Pericardium: Small pericardial effusion adjacent to the left ventricle. Effusion is fluid.  Aorta: Graft present in the ascending aorta. Dissection present.    ASSESSMENT & PLAN:    1. Uncontrolled hypertension  2.  type A aortic dissection s/p emergent repair     Medication Adjustments/Labs and Tests Ordered: Current medicines are reviewed at length with the patient today.  Concerns regarding medicines are outlined above.  Medication changes, Labs and Tests ordered today are listed in the Patient Instructions below. There are no Patient Instructions on file for this visit.   Lorelei Pont, [place]  12/16/2018 8:13 AM    [facility] [address] [place], [place], [place]  [address] Phone: [phone]; Fax: [phone]

## 2018-12-17 ENCOUNTER — Other Ambulatory Visit: Payer: Self-pay

## 2018-12-17 NOTE — Patient Outreach (Signed)
[facility]) Care Management  12/17/2018  JT HAEFS Feb 12, 1976 [id number]   Transition of Care Week #2  Outreach attempt #2 to patient. Spoke with patient who voices he has been doing fairly well. He voices that him and his wife got confused about his cardiologist follow up time and he missed appt. He has rescheduled appt for 12/30/2018. He voices that he has used all his pain meds and has no refills. He still continues to have some occasional pain but primarily at night. He has not contacted MD office to see if he can get some more. RN CM encouraged patient to do so. Patient also stating that due to pain and discomfort from surgery when lying down at night he is having some anxiety. Discussed ways to manage anxiety non-pharmacological and discussed with patient possibly asking MD for prn med to take. Patient voiced understanding and will follow up with MD regarding it. He will call PCP office today. Patient denies any other RN CM needs or concerns at this time. Patient advised to call for any needs or concerns.    Plan: RN CM will follow up with patient within one week.  Antionette Fairy, RN,BSN,CCM [facility] Care Management Telephonic Care Management Coordinator Direct Phone: [phone] Toll Free: [phone] Fax: [phone]

## 2018-12-18 ENCOUNTER — Encounter (INDEPENDENT_AMBULATORY_CARE_PROVIDER_SITE_OTHER): Payer: Self-pay | Admitting: Primary Care

## 2018-12-18 ENCOUNTER — Ambulatory Visit (INDEPENDENT_AMBULATORY_CARE_PROVIDER_SITE_OTHER): Payer: BLUE CROSS/BLUE SHIELD | Admitting: Primary Care

## 2018-12-18 ENCOUNTER — Encounter (INDEPENDENT_AMBULATORY_CARE_PROVIDER_SITE_OTHER): Payer: Self-pay

## 2018-12-18 ENCOUNTER — Telehealth: Payer: Self-pay

## 2018-12-18 ENCOUNTER — Other Ambulatory Visit: Payer: Self-pay

## 2018-12-18 VITALS — BP 148/104 | HR 68 | Temp 97.4°F | Ht 72.0 in | Wt 209.4 lb

## 2018-12-18 DIAGNOSIS — F418 Other specified anxiety disorders: Secondary | ICD-10-CM | POA: Diagnosis not present

## 2018-12-18 DIAGNOSIS — Z760 Encounter for issue of repeat prescription: Secondary | ICD-10-CM

## 2018-12-18 MED ORDER — TRAMADOL HCL 50 MG PO TABS: 50.0000 mg | ORAL_TABLET | Freq: Four times a day (QID) | ORAL | 0 refills | Status: DC | PRN

## 2018-12-18 NOTE — Telephone Encounter (Signed)
Mr. Lawhun contacted the office requesting a refill of his pain medication.  He is s/p Hemiarch repair of an aortic dissection with Dr. Dorris Fetch on 11/24/2018.  Patient at discharge was given Dilaudid, however a prescription for Tramadol was approved by Dr. Donata Clay who was in the office.  Patient was made aware of new prescription and 50mg  Tramadol q6hr as needed for pain was faxed into patient's preferred pharmacy, [facility] on [address] In [place].  Patient also asked if he could have medication for anxiety.  I advised patient that he would need to contact his PCP for anxiety medication as we do not prescribe it.  He acknowledged receipt.

## 2018-12-20 DIAGNOSIS — Z760 Encounter for issue of repeat prescription: Secondary | ICD-10-CM | POA: Insufficient documentation

## 2018-12-20 NOTE — Progress Notes (Signed)
Established Patient Office Visit  Subjective:  Patient ID: Stephen West, male    DOB: 1976/01/14  Age: 43 y.o. MRN: [id number]  CC:  Chief Complaint  Patient presents with  . Anxiety    HPI Stephen West presents for evaluation on anxiety and depression. Last office visit 9/26,2019. Since that time patient has been seen in the ED several times 07/30/09 atypical chest pain. Delta trop within normal limit.11/24/2018-11/30/2018 admission for Hemiarch repair of Type 1 Aortic dissection with antigrade cerebral perfusion & moderate hypothermic circulatory arrest. She has a PMH of cocaine abuse, prior NSTEMI, bronchitis, and  Hypertension.   Past Medical History:  Diagnosis Date  . Bronchitis   . Hypertension   . NSTEMI (non-ST elevated myocardial infarction) [facility])     Past Surgical History:  Procedure Laterality Date  . BENTALL PROCEDURE N/A 11/24/2018   Procedure: HEMIARCH REPAIR OF TYPE ONE AORTIC DISSECTION WITH ANTIGRADE CEREBRAL PERFUSSION AND MODERATE HYPOTHERMIC CIRCULATORY ARREST.;  Surgeon: Loreli Slot, MD;  Location: [facility] OR;  Service: Open Heart Surgery;  Laterality: N/A;  . DENTAL SURGERY      Family History  Problem Relation Age of Onset  . Hypertension Mother   . Diabetes Mellitus II Mother     Social History   Socioeconomic History  . Marital status: Married    Spouse name: Not on file  . Number of children: Not on file  . Years of education: Not on file  . Highest education level: Not on file  Occupational History  . Not on file  Social Needs  . Financial resource strain: Not on file  . Food insecurity:    Worry: Not on file    Inability: Not on file  . Transportation needs:    Medical: Not on file    Non-medical: Not on file  Tobacco Use  . Smoking status: Current Every Day Smoker    Packs/day: 0.50    Types: Cigarettes  . Smokeless tobacco: Never Used  Substance and Sexual Activity  . Alcohol use: Yes    Alcohol/week: 5.0 standard  drinks    Types: 5 Cans of beer per week    Comment: occ  . Drug use: Yes    Types: Marijuana, Cocaine  . Sexual activity: Not on file  Lifestyle  . Physical activity:    Days per week: Not on file    Minutes per session: Not on file  . Stress: Not on file  Relationships  . Social connections:    Talks on phone: Not on file    Gets together: Not on file    Attends religious service: Not on file    Active member of club or organization: Not on file    Attends meetings of clubs or organizations: Not on file    Relationship status: Not on file  . Intimate partner violence:    Fear of current or ex partner: Not on file    Emotionally abused: Not on file    Physically abused: Not on file    Forced sexual activity: Not on file  Other Topics Concern  . Not on file  Social History Narrative  . Not on file    Outpatient Medications Prior to Visit  Medication Sig Dispense Refill  . amiodarone (PACERONE) 200 MG tablet Take 1 tablet (200 mg total) by mouth 2 (two) times daily. 60 tablet 1  . aspirin EC 325 MG EC tablet Take 1 tablet (325 mg total) by mouth daily. 30  tablet 0  . carvedilol (COREG) 25 MG tablet Take 1 tablet (25 mg total) by mouth 2 (two) times daily. 60 tablet 3  . cloNIDine (CATAPRES) 0.2 MG tablet Take 1 tablet (0.2 mg total) by mouth 2 (two) times daily. 60 tablet 3  . hydrochlorothiazide (HYDRODIURIL) 25 MG tablet Take 1 tablet (25 mg total) by mouth daily. 30 tablet 5  . lisinopril (PRINIVIL,ZESTRIL) 40 MG tablet Take 1 tablet (40 mg total) by mouth daily. 30 tablet 5  . atorvastatin (LIPITOR) 40 MG tablet Take 1 tablet (40 mg total) by mouth daily at 6 PM. (Patient not taking: Reported on 12/05/2018) 30 tablet 5  . benzonatate (TESSALON) 200 MG capsule Take 1 capsule (200 mg total) by mouth 2 (two) times daily as needed for cough. (Patient not taking: Reported on 11/24/2018) 20 capsule 0  . fluticasone (FLONASE) 50 MCG/ACT nasal spray Place 2 sprays into both nostrils  daily. (Patient not taking: Reported on 11/24/2018) 16 g 0  . Guaifenesin (MUCINEX MAXIMUM STRENGTH) 1200 MG TB12 Take 1 tablet (1,200 mg total) by mouth 2 (two) times daily. (Patient not taking: Reported on 11/24/2018) 14 tablet 0  . HYDROmorphone (DILAUDID) 2 MG tablet Take 1 tablet (2 mg total) by mouth every 4 (four) hours as needed for moderate pain or severe pain. 30 tablet 0  . potassium chloride SA (K-DUR,KLOR-CON) 20 MEQ tablet Take 1 tablet (20 mEq total) by mouth daily. (Patient not taking: Reported on 11/24/2018) 3 tablet 0   No facility-administered medications prior to visit.     Allergies  Allergen Reactions  . Carrot [Daucus Carota] Anaphylaxis    Unknown  . Banana Nausea And Vomiting    ROS Review of Systems  Constitutional: Negative.   HENT: Positive for dental problem.   Eyes: Negative.   Respiratory: Positive for shortness of breath.   Cardiovascular: Negative.   Gastrointestinal: Negative.   Endocrine: Negative.   Genitourinary: Negative.   Musculoskeletal: Positive for arthralgias.  Skin: Negative.   Allergic/Immunologic: Negative.   Neurological: Negative.   Hematological: Negative.   Psychiatric/Behavioral: Positive for behavioral problems. The patient is nervous/anxious.       Objective:    Physical Exam  Constitutional: He is oriented to person, place, and time. He appears well-developed and well-nourished.  HENT:  Head: Normocephalic.  Eyes: Pupils are equal, round, and reactive to light. EOM are normal.  Neck: Normal range of motion. Neck supple.  Cardiovascular: Normal rate and regular rhythm.  Pulmonary/Chest: Effort normal and breath sounds normal.  Abdominal: Soft. Bowel sounds are normal.  Musculoskeletal: Normal range of motion.  Neurological: He is alert and oriented to person, place, and time.  Skin: Skin is warm and dry.  Psychiatric: He has a normal mood and affect.    BP (!) 148/104 (BP Location: Left Arm, Patient Position: Sitting,  Cuff Size: Normal)   Pulse 68   Temp (!) 97.4 F (36.3 C) (Oral)   Ht 6' (1.829 m)   Wt 209 lb 6.4 oz (95 kg)   SpO2 96%   BMI 28.40 kg/m  Wt Readings from Last 3 Encounters:  12/18/18 209 lb 6.4 oz (95 kg)  12/05/18 205 lb (93 kg)  11/30/18 203 lb 11.3 oz (92.4 kg)     There are no preventive care reminders to display for this patient.  There are no preventive care reminders to display for this patient.  Lab Results  Component Value Date   TSH 0.538 12/30/2013   Lab Results  Component Value Date   WBC 7.7 12/05/2018   HGB 9.0 (L) 12/05/2018   HCT 27.5 (L) 12/05/2018   MCV 91.1 12/05/2018   PLT 561 (H) 12/05/2018   Lab Results  Component Value Date   NA 138 12/05/2018   K 3.6 12/05/2018   CO2 23 12/05/2018   GLUCOSE 121 (H) 12/05/2018   BUN 9 12/05/2018   CREATININE 1.08 12/05/2018   BILITOT 0.4 12/05/2018   ALKPHOS 72 12/05/2018   AST 21 12/05/2018   ALT 32 12/05/2018   PROT 5.6 (L) 12/05/2018   ALBUMIN 2.9 (L) 12/05/2018   CALCIUM 8.5 (L) 12/05/2018   ANIONGAP 10 12/05/2018   Lab Results  Component Value Date   CHOL 138 04/07/2017   Lab Results  Component Value Date   HDL 58 04/07/2017   Lab Results  Component Value Date   LDLCALC 59 04/07/2017   Lab Results  Component Value Date   TRIG 104 04/07/2017   Lab Results  Component Value Date   CHOLHDL 2.4 04/07/2017   Lab Results  Component Value Date   HGBA1C 5.3 04/07/2017      Assessment & Plan:   Note Patient left after I told him I would not prescribe any narcotics or anxiety medication. Advised him to rtn to cardiologist that did the surgery   Follow-up: No follow-ups on file.    Grayce Sessions, NP

## 2018-12-24 ENCOUNTER — Other Ambulatory Visit: Payer: Self-pay

## 2018-12-24 NOTE — Patient Outreach (Signed)
[facility]) Care Management  12/24/2018  Stephen West 10-Nov-1975 [id number]   Transition of Care Week #3     Outreach attempt #1 to patient. No answer. RN CM left HIPAA compliant voicemail message along with contact info.      Plan: RN CM will make outreach attempt to patient within 3-4 business days.  Antionette Fairy, RN,BSN,CCM [facility] Care Management Telephonic Care Management Coordinator Direct Phone: [phone] Toll Free: [phone] Fax: [phone]

## 2018-12-25 ENCOUNTER — Other Ambulatory Visit: Payer: Self-pay

## 2018-12-25 NOTE — Patient Outreach (Signed)
[facility]) Care Management  12/25/2018  AIREN MOTTE 06/08/1976 [id number]   Transition of Care Week #3      Outreach attempt #2 to patient. No answer at present.     Plan:   RN CM will make outreach attempt to patient within 3-4 business days. RN CM will send unsuccessful outreach letter to patient.  Antionette Fairy, RN,BSN,CCM [facility] Care Management Telephonic Care Management Coordinator Direct Phone: [phone] Toll Free: [phone] Fax: [phone]

## 2018-12-28 ENCOUNTER — Other Ambulatory Visit: Payer: Self-pay | Admitting: Thoracic Surgery (Cardiothoracic Vascular Surgery)

## 2018-12-28 DIAGNOSIS — I7101 Dissection of thoracic aorta: Secondary | ICD-10-CM

## 2018-12-28 DIAGNOSIS — I71019 Dissection of thoracic aorta, unspecified: Secondary | ICD-10-CM

## 2018-12-29 ENCOUNTER — Ambulatory Visit
Admission: RE | Admit: 2018-12-29 | Discharge: 2018-12-29 | Disposition: A | Discharge status: Home / Self Care | Payer: BLUE CROSS/BLUE SHIELD | Source: Ambulatory Visit | Attending: Thoracic Surgery (Cardiothoracic Vascular Surgery) | Admitting: Thoracic Surgery (Cardiothoracic Vascular Surgery)

## 2018-12-29 ENCOUNTER — Ambulatory Visit (INDEPENDENT_AMBULATORY_CARE_PROVIDER_SITE_OTHER): Payer: Self-pay | Admitting: Thoracic Surgery (Cardiothoracic Vascular Surgery)

## 2018-12-29 ENCOUNTER — Other Ambulatory Visit: Payer: Self-pay

## 2018-12-29 ENCOUNTER — Other Ambulatory Visit: Payer: Self-pay | Admitting: *Deleted

## 2018-12-29 VITALS — BP 140/88 | HR 74 | Resp 20 | Ht 72.0 in | Wt 203.0 lb

## 2018-12-29 DIAGNOSIS — I71019 Dissection of thoracic aorta, unspecified: Secondary | ICD-10-CM

## 2018-12-29 DIAGNOSIS — I10 Essential (primary) hypertension: Secondary | ICD-10-CM

## 2018-12-29 DIAGNOSIS — J90 Pleural effusion, not elsewhere classified: Secondary | ICD-10-CM

## 2018-12-29 DIAGNOSIS — Z09 Encounter for follow-up examination after completed treatment for conditions other than malignant neoplasm: Secondary | ICD-10-CM

## 2018-12-29 DIAGNOSIS — I7101 Dissection of thoracic aorta: Secondary | ICD-10-CM

## 2018-12-29 MED ORDER — PREDNISONE 10 MG (21) PO TBPK: ORAL_TABLET | ORAL | 0 refills | Status: DC

## 2018-12-29 MED ORDER — TRAMADOL HCL 50 MG PO TABS: 50.0000 mg | ORAL_TABLET | Freq: Four times a day (QID) | ORAL | 0 refills | Status: DC | PRN

## 2018-12-29 MED ORDER — CLONIDINE HCL 0.2 MG PO TABS: 0.2000 mg | ORAL_TABLET | Freq: Two times a day (BID) | ORAL | 3 refills | Status: DC

## 2018-12-29 MED ORDER — LISINOPRIL 40 MG PO TABS: 40.0000 mg | ORAL_TABLET | Freq: Every day | ORAL | 5 refills | Status: DC

## 2018-12-29 MED ORDER — ALPRAZOLAM 0.25 MG PO TABS: 0.2500 mg | ORAL_TABLET | Freq: Two times a day (BID) | ORAL | 0 refills | Status: DC | PRN

## 2018-12-29 NOTE — Progress Notes (Signed)
[address]       Stephen West [address]             [phone]     HPI: [name] returns for a scheduled follow-up visit  Stephen West is a 43 year old gentleman who presented in early February with a type I aortic dissection.  He underwent a hemi-arch repair on 11/24/2018.  His postoperative course was unremarkable and he went home on the 10th.  Since discharge he has been feeling well overall.  He complains of some paresthesias in his hands and his feet.  Those come and go to some degree.  He does still have some incisional pain, but has run out of tramadol.  He has some shortness of breath with exertion and when trying to lie flat and sometimes has coughing spells.  He also is experiencing some anxiety.  Past Medical History:  Diagnosis Date  . Bronchitis   . Hypertension   . NSTEMI (non-ST elevated myocardial infarction) [facility])      Current Outpatient Medications  Medication Sig Dispense Refill  . ALPRAZolam (XANAX) 0.25 MG tablet Take 1 tablet (0.25 mg total) by mouth 2 (two) times daily as needed for anxiety. 20 tablet 0  . amiodarone (PACERONE) 200 MG tablet Take 1 tablet (200 mg total) by mouth 2 (two) times daily. 60 tablet 1  . aspirin EC 325 MG EC tablet Take 1 tablet (325 mg total) by mouth daily. 30 tablet 0  . carvedilol (COREG) 25 MG tablet Take 1 tablet (25 mg total) by mouth 2 (two) times daily. 60 tablet 3  . cloNIDine (CATAPRES) 0.2 MG tablet Take 1 tablet (0.2 mg total) by mouth 2 (two) times daily. 60 tablet 3  . hydrochlorothiazide (HYDRODIURIL) 25 MG tablet Take 1 tablet (25 mg total) by mouth daily. 30 tablet 5  . lisinopril (PRINIVIL,ZESTRIL) 40 MG tablet Take 1 tablet (40 mg total) by mouth daily. 30 tablet 5  . predniSONE (STERAPRED UNI-PAK 21 TAB) 10 MG (21) TBPK tablet Take 6 tablets p.o. day 1, 5 on day 2, 4 on day 3, 3 on day 4, 2 on day 5, and 1 on day 6 21 tablet 0  . traMADol (ULTRAM) 50 MG tablet Take 1 tablet (50 mg total) by  mouth every 6 (six) hours as needed. 25 tablet 0   No current facility-administered medications for this visit.     Physical Exam BP 140/88   Pulse 74   Resp 20   Ht 6' (1.829 m)   Wt 203 lb (92.1 kg)   SpO2 98% Comment: RA  BMI 27.73 kg/m  43 year old man in no acute distress Alert and oriented x3 with no focal motor deficits Lungs diminished at right base, otherwise clear Cardiac regular rate and rhythm normal S1-S2 no rubs or murmurs Sternum stable, incision well-healed No peripheral edema  Diagnostic Tests: CHEST - 2 VIEW  COMPARISON:  PA and lateral chest 12/05/2018.  FINDINGS: Moderate to moderately large right pleural effusion and basilar atelectasis have worsened since the prior examination. The left lung is clear and no left effusion is identified. No pneumothorax. Six intact median sternotomy wires are unchanged. Heart size is upper normal. Aortic atherosclerosis is noted. Surgical clips right axilla are seen. No acute or focal bony abnormality.  IMPRESSION: Marked increase in a moderate to moderately large right pleural effusion with associated basilar atelectasis. No other change.   Electronically Signed   By: Drusilla Kanner M.D.  On: 12/29/2018 11:05 I personally reviewed the chest x-ray images and concur with the findings noted above  Impression: [name] is a 26 year old gentleman with a history of tobacco abuse, cocaine abuse, and uncontrolled hypertension.  He presented in early February with a type I aortic dissection and underwent emergent repair.  His initial postoperative course was uncomplicated and he went home on day 6.  He missed his appointment with cardiology.  That has been rescheduled for tomorrow.  Because of that he ran out of his clonidine and lisinopril.  His blood pressure is elevated today.  I will go ahead and refill those medications for now.  He has a relatively large right pleural effusion.  I am going to arrange for  an ultrasound-guided thoracentesis for that.  I think that will help his breathing tremendously.  I am going to treat him with a prednisone taper after the thoracentesis.  The importance of tobacco cessation and avoidance of cocaine was discussed with the patient.  He does still have some surgical incisional pain.  I am giving him another 25 tablets of tramadol 50 mg 1 p.o. every 6 hours as needed for pain.  He may begin driving on a limited basis.  Appropriate precautions were discussed.  He still should not lift anything over 10 pounds for another 2 more weeks.  He asked about returning to work.  I think he may be able to go back around the time we see him back for follow-up of the pleural effusion.  We will discuss it further at that time.  Plan: Ultrasound-guided thoracentesis Prednisone taper Renewed clonidine and lisinopril prescriptions Tramadol 50 mg p.o. every 6 hours as needed for pain, 25 tablets, no refills Xanax 0.25 mg p.o. twice daily PRN, 20 tablets, no refills for anxiety  Loreli Slot, MD [facility] [phone]

## 2018-12-29 NOTE — Patient Outreach (Addendum)
[facility]) Care Management  12/29/2018  Stephen West Mar 28, 1976 [id number]   Transition of Care Case Closure   Outreach attempt to patient. Spoke with patient who reports he is just leaving follow up surgeon appt. Patient pleased to report that he got a good report. He is healing and recovering well. He voices that nighttime symptoms are improving some. However, he did discuss with MD and was given script for anti-anxiety med to take prn. Patient voices that he is back to doing almost everything he was doing prior to hospitalization. He denies any further RN CM needs or concerns. Patient has completed short term transition of care program and goals have been met. Patient aware that case will be closed  But he is free to call [facility] at any time for any future needs or concerns. He voiced understanding was appreciative of receiving follow up calls during his recovery.     Plan: RN CM will close case at this time as goals have been met. RN CM unable to send MD case closure as message stating no routing info for provider.   Enzo Montgomery, RN,BSN,CCM [facility] Management Telephonic Care Management Coordinator Direct Phone: [phone] Toll Free: [phone] Fax: [phone]

## 2018-12-30 ENCOUNTER — Other Ambulatory Visit: Payer: Self-pay

## 2018-12-30 ENCOUNTER — Encounter: Payer: Self-pay | Admitting: Physician Assistant

## 2018-12-30 ENCOUNTER — Ambulatory Visit: Payer: BLUE CROSS/BLUE SHIELD | Admitting: Physician Assistant

## 2018-12-30 VITALS — BP 140/76 | HR 80 | Ht 72.0 in | Wt 201.8 lb

## 2018-12-30 DIAGNOSIS — Z720 Tobacco use: Secondary | ICD-10-CM

## 2018-12-30 DIAGNOSIS — Z9889 Other specified postprocedural states: Secondary | ICD-10-CM | POA: Diagnosis not present

## 2018-12-30 DIAGNOSIS — I10 Essential (primary) hypertension: Secondary | ICD-10-CM

## 2018-12-30 DIAGNOSIS — J90 Pleural effusion, not elsewhere classified: Secondary | ICD-10-CM | POA: Diagnosis not present

## 2018-12-30 NOTE — Progress Notes (Signed)
Cardiology Office Note    Date:  12/30/2018   ID:  Stephen Salinesaron D Slemmer, DOB 07/02/1976, MRN [id number]  PCP:  Loletta SpecterGomez, Roger David, PA-C  Cardiologist:  Dr. Elease HashimotoNahser  Chief Complaint: Hospital follow up   History of Present Illness:   Stephen West is a 43 y.o. male with a history of tobacco abuse, cocaine abuse, and uncontrolled hypertension who admitted 2/20 with type I aortic dissection.  He underwent a hemi-arch repair on 11/24/2018.  His postoperative course was unremarkable. Missed cardiology follow up. Seen by Dr. Dorris FetchHendrickson yesterday>> run out of clonidine and lisinopril. Elevated BP. Refilled medications. Plan for ultrasound guided thoracentesis for large right pleural effusion 12/02/18. Started taking his medications today. BP of 140/76. Has chest discomfort and shortness of breath when laying on right side.  No exertional dyspnea.  Denies orthopnea, PND, lower extremity edema, palpitation or blood in the stool or urine.  Denies marijuana on cocaine use.  Continues to smoke.  Past Medical History:  Diagnosis Date  . Bronchitis   . Hypertension   . NSTEMI (non-ST elevated myocardial infarction) [facility](HCC)     Past Surgical History:  Procedure Laterality Date  . BENTALL PROCEDURE N/A 11/24/2018   Procedure: HEMIARCH REPAIR OF TYPE ONE AORTIC DISSECTION WITH ANTIGRADE CEREBRAL PERFUSSION AND MODERATE HYPOTHERMIC CIRCULATORY ARREST.;  Surgeon: Loreli SlotHendrickson, Steven C, MD;  Location: [facility] OR;  Service: Open Heart Surgery;  Laterality: N/A;  . DENTAL SURGERY      Current Medications: Prior to Admission medications   Medication Sig Start Date End Date Taking? Authorizing Provider  ALPRAZolam (XANAX) 0.25 MG tablet Take 1 tablet (0.25 mg total) by mouth 2 (two) times daily as needed for anxiety. 12/29/18   Loreli SlotHendrickson, Steven C, MD  amiodarone (PACERONE) 200 MG tablet Take 1 tablet (200 mg total) by mouth 2 (two) times daily. 11/30/18   Barrett, Rae RoamErin R, PA-C  aspirin EC 325 MG EC tablet Take 1  tablet (325 mg total) by mouth daily. 11/30/18   Barrett, Erin R, PA-C  carvedilol (COREG) 25 MG tablet Take 1 tablet (25 mg total) by mouth 2 (two) times daily. 11/30/18   Barrett, Erin R, PA-C  cloNIDine (CATAPRES) 0.2 MG tablet Take 1 tablet (0.2 mg total) by mouth 2 (two) times daily. 12/29/18   Loreli SlotHendrickson, Steven C, MD  hydrochlorothiazide (HYDRODIURIL) 25 MG tablet Take 1 tablet (25 mg total) by mouth daily. 07/16/18   Loletta SpecterGomez, Roger David, PA-C  lisinopril (PRINIVIL,ZESTRIL) 40 MG tablet Take 1 tablet (40 mg total) by mouth daily. 12/29/18   Loreli SlotHendrickson, Steven C, MD  predniSONE (STERAPRED UNI-PAK 21 TAB) 10 MG (21) TBPK tablet Take 6 tablets p.o. day 1, 5 on day 2, 4 on day 3, 3 on day 4, 2 on day 5, and 1 on day 6 12/29/18   Loreli SlotHendrickson, Steven C, MD  traMADol (ULTRAM) 50 MG tablet Take 1 tablet (50 mg total) by mouth every 6 (six) hours as needed. 12/29/18   Loreli SlotHendrickson, Steven C, MD    Allergies:   Carrot [daucus carota] and Banana   Social History   Socioeconomic History  . Marital status: Married    Spouse name: Not on file  . Number of children: Not on file  . Years of education: Not on file  . Highest education level: Not on file  Occupational History  . Not on file  Social Needs  . Financial resource strain: Not on file  . Food insecurity:    Worry: Not on file  Inability: Not on file  . Transportation needs:    Medical: Not on file    Non-medical: Not on file  Tobacco Use  . Smoking status: Current Every Day Smoker    Packs/day: 0.50    Types: Cigarettes  . Smokeless tobacco: Never Used  Substance and Sexual Activity  . Alcohol use: Yes    Alcohol/week: 5.0 standard drinks    Types: 5 Cans of beer per week    Comment: occ  . Drug use: Yes    Types: Marijuana, Cocaine  . Sexual activity: Not on file  Lifestyle  . Physical activity:    Days per week: Not on file    Minutes per session: Not on file  . Stress: Not on file  Relationships  . Social connections:     Talks on phone: Not on file    Gets together: Not on file    Attends religious service: Not on file    Active member of club or organization: Not on file    Attends meetings of clubs or organizations: Not on file    Relationship status: Not on file  Other Topics Concern  . Not on file  Social History Narrative  . Not on file     Family History:  The patient's family history includes Diabetes Mellitus II in his mother; Hypertension in his mother.   ROS:   Please see the history of present illness.    ROS All other systems reviewed and are negative.   PHYSICAL EXAM:   VS:  BP 140/76   Pulse 80   Ht 6' (1.829 m)   Wt 201 lb 12.8 oz (91.5 kg)   SpO2 96%   BMI 27.37 kg/m    GEN: Well nourished, well developed, in no acute distress  HEENT: normal  Neck: no JVD, carotid bruits, or masses Cardiac: RRR; no murmurs, rubs, or gallops,no edema  Respiratory: Diminished breath sounds on right base GI: soft, nontender, nondistended, + BS MS: no deformity or atrophy  Skin: warm and dry, no rash Neuro:  Alert and Oriented x 3, Strength and sensation are intact Psych: euthymic mood, full affect  Wt Readings from Last 3 Encounters:  12/30/18 201 lb 12.8 oz (91.5 kg)  12/29/18 203 lb (92.1 kg)  12/18/18 209 lb 6.4 oz (95 kg)      Studies/Labs Reviewed:   EKG:  EKG is ordered today.  The ekg ordered today demonstrates normal sinus rhythm at rate of 77 bpm  Recent Labs: 11/24/2018: B Natriuretic Peptide 28.3 11/25/2018: Magnesium 2.9 12/05/2018: ALT 32; BUN 9; Creatinine, Ser 1.08; Hemoglobin 9.0; Platelets 561; Potassium 3.6; Sodium 138   Lipid Panel    Component Value Date/Time   CHOL 138 04/07/2017 0458   TRIG 104 04/07/2017 0458   HDL 58 04/07/2017 0458   CHOLHDL 2.4 04/07/2017 0458   VLDL 21 04/07/2017 0458   LDLCALC 59 04/07/2017 0458    Additional studies/ records that were reviewed today include:   TEE 11/24/18  Septum: No Patent Foramen Ovale present.  Left atrium:  Patent foramen ovale not present.  Aorta: The aortic root is mildly dilated at the sinus of Valsalva. The ascending aorta is moderately dilated. Aneurysm present in the ascending aorta and sinus of Valslava. Stanford type A dissection present from the aortic root to the aortic arch.  Right ventricle: Normal cavity size, wall thickness and ejection fraction.  Tricuspid valve: Trace regurgitation.  Pericardium: Small pericardial effusion adjacent to the left ventricle. Effusion  is fluid.  Aorta: Graft present in the ascending aorta. Dissection present.      ASSESSMENT & PLAN:    Type I aortic dissection status post repair - Doing well  Hypertension -Restarted his medication this morning.  Blood pressure of 140/76 today.  He does not have a blood pressure machine at home however he will go to [facility] for checkup.  Continue current therapy.  Follow-up in hypertensive clinic in few weeks.  Right pleural effusion -Plan for ultrasound-guided thoracentesis tomorrow.  Tobacco abuse -Advised complete cessation.  Denies use of cocaine since discharge.    Medication Adjustments/Labs and Tests Ordered: Current medicines are reviewed at length with the patient today.  Concerns regarding medicines are outlined above.  Medication changes, Labs and Tests ordered today are listed in the Patient Instructions below. Patient Instructions  Medication Instructions:  Your physician recommends that you continue on your current medications as directed. Please refer to the Current Medication list given to you today.  If you need a refill on your cardiac medications before your next appointment, please call your pharmacy.   Lab work: NONE If you have labs (blood work) drawn today and your tests are completely normal, you will receive your results only by: Marland Kitchen MyChart Message (if you have MyChart) OR . A paper copy in the mail If you have any lab test that is abnormal or we need to change your  treatment, we will call you to review the results.  Testing/Procedures: NONE  Follow-Up: At [facility], you and your health needs are our priority.  As part of our continuing mission to provide you with exceptional heart care, we have created designated Provider Care Teams.  These Care Teams include your primary Cardiologist (physician) and Advanced Practice Providers (APPs -  Physician Assistants and Nurse Practitioners) who all work together to provide you with the care you need, when you need it. You will need a follow up appointment in:  3-4 months.   You may see Kristeen Miss, MD or one of the following Advanced Practice Providers on your designated Care Team: Tereso Newcomer, PA-C Vin Hector, [place] . Berton Bon, NP  Your physician recommends that you schedule a follow-up appointment in: 2-3 WEEKS WITH THE HYPERTENSION CLINIC   Any Other Special Instructions Will Be Listed Below (If Applicable).    Lorelei Pont, [place]  12/30/2018 1:58 PM    [facility] [address] [place], [place], [place]  [address] Phone: [phone]; Fax: [phone]

## 2018-12-30 NOTE — Patient Instructions (Signed)
Medication Instructions:  Your physician recommends that you continue on your current medications as directed. Please refer to the Current Medication list given to you today.  If you need a refill on your cardiac medications before your next appointment, please call your pharmacy.   Lab work: NONE If you have labs (blood work) drawn today and your tests are completely normal, you will receive your results only by: Marland Kitchen MyChart Message (if you have MyChart) OR . A paper copy in the mail If you have any lab test that is abnormal or we need to change your treatment, we will call you to review the results.  Testing/Procedures: NONE  Follow-Up: At [facility], you and your health needs are our priority.  As part of our continuing mission to provide you with exceptional heart care, we have created designated Provider Care Teams.  These Care Teams include your primary Cardiologist (physician) and Advanced Practice Providers (APPs -  Physician Assistants and Nurse Practitioners) who all work together to provide you with the care you need, when you need it. You will need a follow up appointment in:  3-4 months.   You may see Kristeen Miss, MD or one of the following Advanced Practice Providers on your designated Care Team: Tereso Newcomer, PA-C [place], [place] . Berton Bon, NP  Your physician recommends that you schedule a follow-up appointment in: 2-3 WEEKS WITH THE HYPERTENSION CLINIC   Any Other Special Instructions Will Be Listed Below (If Applicable).

## 2018-12-31 ENCOUNTER — Ambulatory Visit (HOSPITAL_COMMUNITY)
Admission: RE | Admit: 2018-12-31 | Discharge: 2018-12-31 | Disposition: A | Discharge status: Home / Self Care | Payer: BLUE CROSS/BLUE SHIELD | Source: Ambulatory Visit | Attending: Thoracic Surgery (Cardiothoracic Vascular Surgery) | Admitting: Thoracic Surgery (Cardiothoracic Vascular Surgery)

## 2018-12-31 ENCOUNTER — Encounter (HOSPITAL_COMMUNITY): Payer: Self-pay | Admitting: Physician Assistant

## 2018-12-31 ENCOUNTER — Other Ambulatory Visit: Payer: Self-pay

## 2018-12-31 ENCOUNTER — Other Ambulatory Visit: Payer: Self-pay | Admitting: Thoracic Surgery (Cardiothoracic Vascular Surgery)

## 2018-12-31 DIAGNOSIS — J90 Pleural effusion, not elsewhere classified: Secondary | ICD-10-CM

## 2018-12-31 HISTORY — PX: IR THORACENTESIS ASP PLEURAL SPACE W/IMG GUIDE: IMG5380

## 2018-12-31 MED ORDER — LIDOCAINE HCL 1 % IJ SOLN
INTRAMUSCULAR | Status: AC
  Filled 2018-12-31: qty 20

## 2018-12-31 MED ORDER — LIDOCAINE HCL (PF) 1 % IJ SOLN
INTRAMUSCULAR | Status: AC | PRN
  Administered 2018-12-31: 10 mL

## 2018-12-31 NOTE — Procedures (Signed)
PROCEDURE SUMMARY:  Successful US guided right thoracentesis. Yielded 1.65 L  of amber fluid. Patient tolerated procedure well. No immediate complications. EBL = trace  Post procedure chest X-ray reveals no pneumothorax  Paytyn Mesta S Conchita Truxillo PA-C 12/31/2018 12:20 PM

## 2019-01-11 ENCOUNTER — Emergency Department (HOSPITAL_COMMUNITY): Payer: BLUE CROSS/BLUE SHIELD

## 2019-01-11 ENCOUNTER — Other Ambulatory Visit: Payer: Self-pay

## 2019-01-11 ENCOUNTER — Telehealth: Payer: Self-pay

## 2019-01-11 ENCOUNTER — Emergency Department (HOSPITAL_COMMUNITY)
Admission: EM | Admit: 2019-01-11 | Discharge: 2019-01-11 | Disposition: A | Discharge status: Home / Self Care | Payer: BLUE CROSS/BLUE SHIELD | Attending: Emergency Medicine | Admitting: Emergency Medicine

## 2019-01-11 ENCOUNTER — Encounter (HOSPITAL_COMMUNITY): Payer: Self-pay | Admitting: Emergency Medicine

## 2019-01-11 ENCOUNTER — Ambulatory Visit
Admission: RE | Admit: 2019-01-11 | Discharge: 2019-01-11 | Disposition: A | Discharge status: Home / Self Care | Payer: BLUE CROSS/BLUE SHIELD | Source: Ambulatory Visit | Attending: Cardiothoracic Surgery | Admitting: Cardiothoracic Surgery

## 2019-01-11 DIAGNOSIS — I10 Essential (primary) hypertension: Secondary | ICD-10-CM | POA: Insufficient documentation

## 2019-01-11 DIAGNOSIS — Z7982 Long term (current) use of aspirin: Secondary | ICD-10-CM | POA: Diagnosis not present

## 2019-01-11 DIAGNOSIS — F17210 Nicotine dependence, cigarettes, uncomplicated: Secondary | ICD-10-CM | POA: Diagnosis not present

## 2019-01-11 DIAGNOSIS — I252 Old myocardial infarction: Secondary | ICD-10-CM | POA: Insufficient documentation

## 2019-01-11 DIAGNOSIS — J90 Pleural effusion, not elsewhere classified: Secondary | ICD-10-CM

## 2019-01-11 DIAGNOSIS — R0602 Shortness of breath: Secondary | ICD-10-CM

## 2019-01-11 DIAGNOSIS — Z79899 Other long term (current) drug therapy: Secondary | ICD-10-CM | POA: Insufficient documentation

## 2019-01-11 DIAGNOSIS — J9811 Atelectasis: Secondary | ICD-10-CM | POA: Diagnosis not present

## 2019-01-11 DIAGNOSIS — F1290 Cannabis use, unspecified, uncomplicated: Secondary | ICD-10-CM | POA: Diagnosis not present

## 2019-01-11 LAB — URINALYSIS, ROUTINE W REFLEX MICROSCOPIC
Bilirubin Urine: NEGATIVE
GLUCOSE, UA: NEGATIVE mg/dL
Hgb urine dipstick: NEGATIVE
Ketones, ur: NEGATIVE mg/dL
Leukocytes,Ua: NEGATIVE
Nitrite: NEGATIVE
PROTEIN: NEGATIVE mg/dL
Specific Gravity, Urine: 1.018 (ref 1.005–1.030)
pH: 6 (ref 5.0–8.0)

## 2019-01-11 LAB — RAPID URINE DRUG SCREEN, HOSP PERFORMED
Amphetamines: NOT DETECTED
Barbiturates: NOT DETECTED
Benzodiazepines: NOT DETECTED
Cocaine: NOT DETECTED
Opiates: NOT DETECTED
Tetrahydrocannabinol: POSITIVE — AB

## 2019-01-11 LAB — CBC WITH DIFFERENTIAL/PLATELET
Abs Immature Granulocytes: 0.02 10*3/uL (ref 0.00–0.07)
Basophils Absolute: 0 10*3/uL (ref 0.0–0.1)
Basophils Relative: 1 %
Eosinophils Absolute: 0.4 10*3/uL (ref 0.0–0.5)
Eosinophils Relative: 7 %
HCT: 39.6 % (ref 39.0–52.0)
Hemoglobin: 12.8 g/dL — ABNORMAL LOW (ref 13.0–17.0)
Immature Granulocytes: 0 %
LYMPHS PCT: 33 %
Lymphs Abs: 2.2 10*3/uL (ref 0.7–4.0)
MCH: 27.5 pg (ref 26.0–34.0)
MCHC: 32.3 g/dL (ref 30.0–36.0)
MCV: 85 fL (ref 80.0–100.0)
Monocytes Absolute: 0.7 10*3/uL (ref 0.1–1.0)
Monocytes Relative: 10 %
Neutro Abs: 3.3 10*3/uL (ref 1.7–7.7)
Neutrophils Relative %: 49 %
PLATELETS: 354 10*3/uL (ref 150–400)
RBC: 4.66 MIL/uL (ref 4.22–5.81)
RDW: 15.3 % (ref 11.5–15.5)
WBC: 6.6 10*3/uL (ref 4.0–10.5)
nRBC: 0 % (ref 0.0–0.2)

## 2019-01-11 LAB — BRAIN NATRIURETIC PEPTIDE: B NATRIURETIC PEPTIDE 5: 146 pg/mL — AB (ref 0.0–100.0)

## 2019-01-11 LAB — COMPREHENSIVE METABOLIC PANEL
ALT: 21 U/L (ref 0–44)
AST: 20 U/L (ref 15–41)
Albumin: 3.3 g/dL — ABNORMAL LOW (ref 3.5–5.0)
Alkaline Phosphatase: 77 U/L (ref 38–126)
Anion gap: 11 (ref 5–15)
BUN: 8 mg/dL (ref 6–20)
CO2: 26 mmol/L (ref 22–32)
Calcium: 8.9 mg/dL (ref 8.9–10.3)
Chloride: 101 mmol/L (ref 98–111)
Creatinine, Ser: 1.2 mg/dL (ref 0.61–1.24)
GFR calc Af Amer: >60 mL/min (ref >60)
GFR calc non Af Amer: >60 mL/min (ref >60)
Glucose, Bld: 96 mg/dL (ref 70–99)
Potassium: 3.8 mmol/L (ref 3.5–5.1)
Sodium: 138 mmol/L (ref 135–145)
Total Bilirubin: 0.6 mg/dL (ref 0.3–1.2)
Total Protein: 6.4 g/dL — ABNORMAL LOW (ref 6.5–8.1)

## 2019-01-11 MED ORDER — DOXYCYCLINE HYCLATE 100 MG PO CAPS: 100.0000 mg | ORAL_CAPSULE | Freq: Two times a day (BID) | ORAL | 0 refills | Status: DC

## 2019-01-11 NOTE — Discharge Instructions (Addendum)
Start taking the antibiotic right away if you develop fever or symptoms of flu like illness. Get help right away if: You are short of breath. You develop chest pain. You develop a new cough.

## 2019-01-11 NOTE — ED Provider Notes (Signed)
[facility] Provider Note   CSN: [id number] Arrival date & time: 01/11/19  1220    History   Chief Complaint Chief Complaint  Patient presents with  . Shortness of Breath    HPI Stephen West is a 43 y.o. male who presents to the ed with cc shortness of breath. He  has a past medical history of Bronchitis, Hypertension, and NSTEMI (non-ST elevated myocardial infarction) (HCC).  She also has a past medical history of tobacco abuse, cocaine abuse, hypertensive emergency, recent aortic thoracic dissection with repair.  Patient also had pleural effusion requiring thoracentesis through IR on 12/31/2018.  Patient states that he developed shortness of breath for the past 3 to 4 days which feels similar to the previous episode that he had of a pleural effusion.  Patient states that he called his CT surgery and they set up an outpatient chest x-ray.  Upon arrival he was greeted outside by healthcare worker at the imaging facility who took his temperature and told him he had a fever of 101 and needed to come directly to the hospital for testing.  He states that the woman behind him also had a fever of 101.  He states that he was unaware he was febrile but does not feel that he has a fever he does not have cough, chest pain, body aches, chills, or other symptoms of URI or flulike illness.  Patient has had no contact with lab confirmed coronavirus or travel outside of the county.      HPI  Past Medical History:  Diagnosis Date  . Bronchitis   . Hypertension   . NSTEMI (non-ST elevated myocardial infarction) [facility] [place](HCC)     Patient Active Problem List   Diagnosis Date Noted  . Encounter for medication refill 12/20/2018  . Aortic dissection, thoracic (HCC) 11/24/2018  . Polysubstance abuse (HCC) 04/07/2017  . Tobacco abuse 04/07/2017  . Hypertensive emergency 04/07/2017  . Hypokalemia 04/07/2017  . Hip pain 01/12/2014  . Sinus congestion 01/12/2014  .  Asthma 01/06/2014  . HTN (hypertension) 12/30/2013  . Borderline hyperglycemia 12/30/2013  . Chronic generalized abdominal pain 12/30/2013  . Headache 12/30/2013  . Urinary frequency 12/30/2013  . Non-suicidal depressed mood 12/30/2013  . Tobacco abuse counseling 12/30/2013  . Screening for hyperlipidemia 12/30/2013    Past Surgical History:  Procedure Laterality Date  . BENTALL PROCEDURE N/A 11/24/2018   Procedure: HEMIARCH REPAIR OF TYPE ONE AORTIC DISSECTION WITH ANTIGRADE CEREBRAL PERFUSSION AND MODERATE HYPOTHERMIC CIRCULATORY ARREST.;  Surgeon: Loreli SlotHendrickson, Steven C, MD;  Location: [facility] OR;  Service: Open Heart Surgery;  Laterality: N/A;  . DENTAL SURGERY    . IR THORACENTESIS ASP PLEURAL SPACE W/IMG GUIDE  12/31/2018        Home Medications    Prior to Admission medications   Medication Sig Start Date End Date Taking? Authorizing Provider  ALPRAZolam (XANAX) 0.25 MG tablet Take 1 tablet (0.25 mg total) by mouth 2 (two) times daily as needed for anxiety. 12/29/18   Loreli SlotHendrickson, Steven C, MD  amiodarone (PACERONE) 200 MG tablet Take 1 tablet (200 mg total) by mouth 2 (two) times daily. 11/30/18   Barrett, Rae RoamErin R, PA-C  aspirin EC 325 MG EC tablet Take 1 tablet (325 mg total) by mouth daily. 11/30/18   Barrett, Erin R, PA-C  carvedilol (COREG) 25 MG tablet Take 1 tablet (25 mg total) by mouth 2 (two) times daily. 11/30/18   Barrett, Erin R, PA-C  cloNIDine (CATAPRES) 0.2 MG  tablet Take 1 tablet (0.2 mg total) by mouth 2 (two) times daily. 12/29/18   Loreli Slot, MD  doxycycline (VIBRAMYCIN) 100 MG capsule Take 1 capsule (100 mg total) by mouth 2 (two) times daily. One po bid x 7 days 01/11/19   Arthor Captain, PA-C  hydrochlorothiazide (HYDRODIURIL) 25 MG tablet Take 1 tablet (25 mg total) by mouth daily. 07/16/18   Loletta Specter, PA-C  lisinopril (PRINIVIL,ZESTRIL) 40 MG tablet Take 1 tablet (40 mg total) by mouth daily. 12/29/18   Loreli Slot, MD  predniSONE  (STERAPRED UNI-PAK 21 TAB) 10 MG (21) TBPK tablet Take 6 tablets p.o. day 1, 5 on day 2, 4 on day 3, 3 on day 4, 2 on day 5, and 1 on day 6 12/29/18   Loreli Slot, MD  traMADol (ULTRAM) 50 MG tablet Take 1 tablet (50 mg total) by mouth every 6 (six) hours as needed. 12/29/18   Loreli Slot, MD    Family History Family History  Problem Relation Age of Onset  . Hypertension Mother   . Diabetes Mellitus II Mother     Social History Social History   Tobacco Use  . Smoking status: Current Every Day Smoker    Packs/day: 0.50    Types: Cigarettes  . Smokeless tobacco: Never Used  Substance Use Topics  . Alcohol use: Not Currently    Alcohol/week: 5.0 standard drinks    Types: 5 Cans of beer per week    Comment: occ  . Drug use: Yes    Types: Marijuana    Comment: previous cocaine use     Allergies   Carrot [daucus carota] and Banana   Review of Systems Review of Systems  Ten systems reviewed and are negative for acute change, except as noted in the HPI.   Physical Exam Updated Vital Signs BP (!) 159/128   Pulse 67   Temp 97.9 F (36.6 C) (Oral)   Resp (!) 23   Ht 6' (1.829 m)   Wt 97.5 kg   SpO2 99%   BMI 29.16 kg/m    ED Treatments / Results  Labs (all labs ordered are listed, but only abnormal results are displayed) Labs Reviewed  COMPREHENSIVE METABOLIC PANEL - Abnormal; Notable for the following components:      Result Value   Total Protein 6.4 (*)    Albumin 3.3 (*)    All other components within normal limits  CBC WITH DIFFERENTIAL/PLATELET - Abnormal; Notable for the following components:   Hemoglobin 12.8 (*)    All other components within normal limits  BRAIN NATRIURETIC PEPTIDE - Abnormal; Notable for the following components:   B Natriuretic Peptide 146.0 (*)    All other components within normal limits  RAPID URINE DRUG SCREEN, HOSP PERFORMED - Abnormal; Notable for the following components:   Tetrahydrocannabinol POSITIVE (*)     All other components within normal limits  URINALYSIS, ROUTINE W REFLEX MICROSCOPIC    EKG EKG Interpretation  Date/Time:  Monday January 11 2019 12:35:06 EDT Ventricular Rate:  69 PR Interval:    QRS Duration: 102 QT Interval:  543 QTC Calculation: 582 R Axis:   96 Text Interpretation:  Sinus rhythm LVH with secondary repolarization abnormality Probable anterior infarct, age indeterminate Prolonged QT interval T waves now upright inferiorly compared to New since previous tracing Confirmed by Vanetta Mulders [phone]) on 01/11/2019 1:13:42 PM   Radiology Dg Chest Port 1 View  Result Date: 01/11/2019 CLINICAL DATA:  Cough,  shortness of breath, and fever. EXAM: PORTABLE CHEST 1 VIEW COMPARISON:  Chest x-ray dated December 31, 2018. FINDINGS: The heart size and mediastinal contours are within normal limits. Normal pulmonary vascularity. Increasing small right pleural effusion with worsening right lower opacity. The left lung is clear. No pneumothorax. No acute osseous abnormality. IMPRESSION: 1. Increasing small right pleural effusion with worsening right lower lobe atelectasis versus infiltrate. Electronically Signed   By: Obie Dredge M.D.   On: 01/11/2019 13:49    Procedures Procedures (including critical care time)  Medications Ordered in ED Medications - No data to display   Initial Impression / Assessment and Plan / ED Course  I have reviewed the triage vital signs and the nursing notes.  Pertinent labs & imaging results that were available during my care of the patient were reviewed by me and considered in my medical decision making (see chart for details).        CC:SOB VS:  Vitals:   01/11/19 1415 01/11/19 1445 01/11/19 1530 01/11/19 1630  BP: (!) 149/107 (!) 159/119 (!) 165/112 (!) 159/128  Pulse: (!) 58 (!) 56 63 67  Resp: 20 19 20  (!) 23  Temp:      TempSrc:      SpO2: 94% 96% 97% 99%  Weight:      Height:       ZO:[id number] is gathered by the patient  and   Review of EMR. DDX: The emergent differential diagnosis for shortness of breath includes, but is not limited to, Pulmonary edema, bronchoconstriction, Pneumonia, Pulmonary embolism, Pneumotherax/ Hemothorax, Dysrythmia, ACS.  Labs: I reviewed the labs which show UDS pos for THC.  BNP is elevated from previous without clinical evidence of volume overload.  She did not have an elevated white blood cell count.  His urinalysis is negative for acute abnormality Imaging: I personally reviewed the images (1 view chest film) which show(s) recurrent small right pleural effusion and atelectasis versus consolidation. EKG: shows prolonged QT WUJ:[id number] was told that he had a fever earlier today but he is asymptomatic afebrile here.  The patient who was tested just behind him had the same temperature and was also not feeling poorly.  I think this may have been a faulty equipment issue.  Patient does not appear to be in any acute distress.  He does not even in fact appear to have any URI symptoms I do not think he needs corona testing.  His chest x-ray shows no significant worsening of his pleural effusion.  He is not hypoxic or tachypneic and I do not think he needs emergent thoracentesis.  Of asked the patient to follow-up with his PCP for outpatient scheduling.  Patient may have developing pneumonia and I will give him doxycycline should he develop productive cough symptoms of URI or fever.  Discussed the case with Dr. Gustavus Messing who agrees with plan for discharge. Patient disposition: Discharge home Patient condition: good. The patient appears reasonably screened and/or stabilized for discharge and I doubt any other medical condition or other [facility] requiring further screening, evaluation, or treatment in the ED at this time prior to discharge. I have discussed lab and/or imaging findings with the patient and answered all questions/concerns to the best of my ability.  I have discussed return precautions and OP follow up.       Final Clinical Impressions(s) / ED Diagnoses   Final diagnoses:  Pleural effusion  Atelectasis    ED Discharge Orders         Ordered  doxycycline (VIBRAMYCIN) 100 MG capsule  2 times daily     01/11/19 1615           Arthor Captain, PA-C 01/11/19 1713    Vanetta Mulders, MD 01/13/19 2038

## 2019-01-11 NOTE — ED Triage Notes (Signed)
Pt states he has been feeling SOB for 3-4 days. Pt went to [facility] today for chest xray, was found to have 101 fever and sent here. Pt has intermittent cp with deep breaths. Denies cough.

## 2019-01-11 NOTE — Telephone Encounter (Signed)
Patient contacted in regards to symptoms that started over the weekend.  Patient stated that he has had a dry cough, some shortness of breath, and chest pain that started about 2 days ago.  He stated that it got a little worse.  Patient had known pleural effusion to which he had a thoracentesis 12/31/2018 ordered by Dr. Dorris Fetch and he felt like these were the same symptoms.  I did ask if he had a temperature as [facility] would be screening patient's at the door for COVID-19 during this time.  He denied having a temperature.  I advised the patient to go to [facility] for a chest xray to check for a recurrent pleural effusion.    Received a phone call from [facility] that patient was checked upon arrival and was said to have a temperature of 101.  Patient was then instructed to be tested for COVID-19 due to symptoms at [facility] ED.

## 2019-01-11 NOTE — ED Notes (Signed)
Gave pt incentive spirometer. Educated pt on use. Pt has one at home and is familiar with how they are used.

## 2019-01-11 NOTE — Telephone Encounter (Signed)
-----   Message from Davina Poke, RN sent at 01/11/2019  8:52 AM EDT ----- Stephen West girls,   PVT wants this patient to come today with a CXR to see a PA and more than likely get a repeat thoracentesis ordered.  This is a [facility] patient who had srgy last month!  Can one of you call Stephen West to check on Stephen West?  Get symptoms?  We might just want Stephen West to get a CXR and then we order it versus bringing Stephen West to clinic.  On the Vm that PVT left, it sounded like he called yesterday with the same symptoms he had last time with his effusion (SOB and cough).  Thanks,  [employer]

## 2019-01-18 ENCOUNTER — Other Ambulatory Visit: Payer: Self-pay | Admitting: Thoracic Surgery (Cardiothoracic Vascular Surgery)

## 2019-01-18 DIAGNOSIS — I7101 Dissection of thoracic aorta: Secondary | ICD-10-CM

## 2019-01-18 DIAGNOSIS — I71019 Dissection of thoracic aorta, unspecified: Secondary | ICD-10-CM

## 2019-01-19 ENCOUNTER — Encounter: Payer: Self-pay | Admitting: Thoracic Surgery (Cardiothoracic Vascular Surgery)

## 2019-01-19 ENCOUNTER — Ambulatory Visit (INDEPENDENT_AMBULATORY_CARE_PROVIDER_SITE_OTHER): Payer: Self-pay | Admitting: Thoracic Surgery (Cardiothoracic Vascular Surgery)

## 2019-01-19 ENCOUNTER — Other Ambulatory Visit: Payer: Self-pay

## 2019-01-19 ENCOUNTER — Ambulatory Visit: Payer: BLUE CROSS/BLUE SHIELD

## 2019-01-19 ENCOUNTER — Ambulatory Visit
Admission: RE | Admit: 2019-01-19 | Discharge: 2019-01-19 | Disposition: A | Discharge status: Home / Self Care | Payer: BLUE CROSS/BLUE SHIELD | Source: Ambulatory Visit | Attending: Thoracic Surgery (Cardiothoracic Vascular Surgery) | Admitting: Thoracic Surgery (Cardiothoracic Vascular Surgery)

## 2019-01-19 VITALS — BP 150/105 | HR 60 | Resp 16 | Ht 72.0 in | Wt 210.2 lb

## 2019-01-19 DIAGNOSIS — Z09 Encounter for follow-up examination after completed treatment for conditions other than malignant neoplasm: Secondary | ICD-10-CM

## 2019-01-19 DIAGNOSIS — I71019 Dissection of thoracic aorta, unspecified: Secondary | ICD-10-CM

## 2019-01-19 DIAGNOSIS — J90 Pleural effusion, not elsewhere classified: Secondary | ICD-10-CM

## 2019-01-19 DIAGNOSIS — I7101 Dissection of thoracic aorta: Secondary | ICD-10-CM

## 2019-01-19 MED ORDER — PREDNISONE 10 MG (21) PO TBPK: ORAL_TABLET | ORAL | 0 refills | Status: DC

## 2019-01-19 NOTE — Progress Notes (Signed)
[address]       [name] [address]             [phone]     HPI: Stephen West returns for scheduled follow-up visit  Stephen West is a 43 year old gentleman who presented with a type I aortic dissection in February.  He underwent a hemi-arch repair on 11/24/2018.  He had some atrial fibrillation postoperatively and was started on amiodarone.  He was discharged on day 6.  I saw him in the office on 12/29/2018.  He still having some incisional pain.  He also had a moderate right pleural effusion.  A thoracentesis drained 1.6 L of fluid.  He was scheduled to come back last week but had a fever of 101 when he went to get his chest x-ray.  He was sent to the emergency room.  He was given a prescription for doxycycline.  His fevers have resolved.  He does still have some incisional pain.  He has an occasional productive cough.  Past Medical History:  Diagnosis Date  . Bronchitis   . Hypertension   . NSTEMI (non-ST elevated myocardial infarction) [facility])     Current Outpatient Medications  Medication Sig Dispense Refill  . aspirin EC 325 MG EC tablet Take 1 tablet (325 mg total) by mouth daily. 30 tablet 0  . carvedilol (COREG) 25 MG tablet Take 1 tablet (25 mg total) by mouth 2 (two) times daily. 60 tablet 3  . cloNIDine (CATAPRES) 0.2 MG tablet Take 1 tablet (0.2 mg total) by mouth 2 (two) times daily. 60 tablet 3  . hydrochlorothiazide (HYDRODIURIL) 25 MG tablet Take 1 tablet (25 mg total) by mouth daily. 30 tablet 5  . lisinopril (PRINIVIL,ZESTRIL) 40 MG tablet Take 1 tablet (40 mg total) by mouth daily. 30 tablet 5  . predniSONE (STERAPRED UNI-PAK 21 TAB) 10 MG (21) TBPK tablet Take 6 tablets p.o. day 1, 5 on day 2, 4 on day 3, 3 on day 4, 2 on day 5, and 1 on day 6 21 tablet 0  . traMADol (ULTRAM) 50 MG tablet Take 1 tablet (50 mg total) by mouth every 6 (six) hours as needed. (Patient not taking: Reported on 01/19/2019) 25 tablet 0   No current  facility-administered medications for this visit.     Physical Exam BP (!) 150/105 (BP Location: Right Arm, Patient Position: Sitting, Cuff Size: Large)   Pulse 60   Resp 16   Ht 6' (1.829 m)   Wt 210 lb 3.2 oz (95.3 kg)   SpO2 99% Comment: ON RA  BMI 28.51 kg/m  43 year old man in no acute distress Alert and oriented x3 with no focal deficits Lungs decreased at right base, otherwise clear Cardiac regular rate and rhythm normal S1 and S2 Sternum stable No peripheral edema  Diagnostic Tests: CHEST - 2 VIEW  COMPARISON:  01/11/2019  FINDINGS: Enlargement of cardiac silhouette post median sternotomy.  Mediastinal contours and pulmonary vascularity normal.  Persistent RIGHT pleural effusion and basilar atelectasis.  Lungs otherwise clear.  No infiltrate, pneumothorax, or acute osseous findings.  IMPRESSION: Mild enlargement of cardiac silhouette.  Persistent RIGHT pleural effusion and basilar atelectasis.   Electronically Signed   By: Ulyses Southward M.D.   On: 01/19/2019 09:59 I personally reviewed the chest x-ray images and compared to previous.  Effusion is decreased compared to last week.  Markedly improved compared to 12/29/2018.  Impression: Stephen West is a 9 year old gentleman who has a  history of hypertension who underwent repair of a type I aortic dissection with a hemi-arch repair on 11/24/2018.  Overall he is doing well.  Postoperative atrial fibrillation-he is now almost 8 weeks out from surgery.  He can stop his amiodarone at this point.  Right pleural effusion-he still has a small right pleural effusion.  Definitely improved from his chest x-ray from 3 weeks ago and a little better than a week ago.  I do think he might benefit from a another prednisone taper, so I will go ahead and order that.  Hypertension-blood pressure elevated at 150/105.  He says he has been checking it at home and has been in the 130/80 range for the most part.  He has not  checked it every day.  I recommended that he check that every day.  If he is consistently seeing systolic pressures in the 150-160 range, he should let us know so we can adjust his medications.  Plan:  Repeat prednisone taper Check blood pressure daily Return in 6 weeks with PA and lateral chest x-ray  Loreli Slot, MD [facility] [phone]

## 2019-01-20 ENCOUNTER — Telehealth (HOSPITAL_COMMUNITY): Payer: Self-pay | Admitting: *Deleted

## 2019-01-20 NOTE — Telephone Encounter (Signed)
Called and spoke with pt regarding referral we have received.  Pt is interested.  Advised that the department is closed to patients due to Covid-19.  Once we have resumed scheduling, he will be contacted. Pt unsure if he will be able to participate since he has been cleared to return back to work.  Pt agreeable to a call back and verbalized understanding. Alanson Aly, BSN Cardiac and Emergency planning/management officer

## 2019-01-26 ENCOUNTER — Telehealth: Payer: Self-pay | Admitting: Pharmacist

## 2019-01-26 NOTE — Telephone Encounter (Addendum)
Multiple attempts to complete phone visit- I will close encounter at this time

## 2019-02-08 ENCOUNTER — Telehealth: Payer: Self-pay | Admitting: Pharmacist

## 2019-02-08 NOTE — Telephone Encounter (Signed)
Called patient this AM to set up phone visit for HTN. Patient asked I call him back at 12:00. Called patient at 12:15. No answer. Left voicemail for patient to return call.

## 2019-02-15 NOTE — Telephone Encounter (Signed)
LMOM again for pt to schedule HTN telehealth visit.

## 2019-03-01 ENCOUNTER — Other Ambulatory Visit: Payer: Self-pay | Admitting: Thoracic Surgery (Cardiothoracic Vascular Surgery)

## 2019-03-01 ENCOUNTER — Other Ambulatory Visit: Payer: Self-pay

## 2019-03-01 DIAGNOSIS — I71019 Dissection of thoracic aorta, unspecified: Secondary | ICD-10-CM

## 2019-03-01 DIAGNOSIS — I7101 Dissection of thoracic aorta: Secondary | ICD-10-CM

## 2019-03-02 ENCOUNTER — Ambulatory Visit (INDEPENDENT_AMBULATORY_CARE_PROVIDER_SITE_OTHER): Payer: BLUE CROSS/BLUE SHIELD | Admitting: Thoracic Surgery (Cardiothoracic Vascular Surgery)

## 2019-03-02 ENCOUNTER — Ambulatory Visit
Admission: RE | Admit: 2019-03-02 | Discharge: 2019-03-02 | Disposition: A | Discharge status: Home / Self Care | Payer: BLUE CROSS/BLUE SHIELD | Source: Ambulatory Visit | Attending: Thoracic Surgery (Cardiothoracic Vascular Surgery) | Admitting: Thoracic Surgery (Cardiothoracic Vascular Surgery)

## 2019-03-02 VITALS — BP 140/88 | HR 70 | Temp 97.7°F | Resp 20 | Ht 72.0 in | Wt 210.0 lb

## 2019-03-02 DIAGNOSIS — I7101 Dissection of thoracic aorta: Secondary | ICD-10-CM

## 2019-03-02 DIAGNOSIS — J90 Pleural effusion, not elsewhere classified: Secondary | ICD-10-CM

## 2019-03-02 DIAGNOSIS — I71019 Dissection of thoracic aorta, unspecified: Secondary | ICD-10-CM

## 2019-03-02 NOTE — Progress Notes (Signed)
[address]       [name] [address]             [phone]      HPI: Stephen West returns for a scheduled follow-up visit  Stephen West is a 43 year old man with a history of hypertension who presented with a type I aortic dissection in February 2020.  He underwent a hemi-arch repair.  Postoperatively had atrial fibrillation.  He converted to sinus rhythm with amiodarone.  He went home on postop day 6.  He has been off the amiodarone without any recurrence of his atrial fibrillation.  He did have a right pleural effusion.  We did a thoracentesis in early March and drained about 1.6 L of fluid.  He was treated with a couple of prednisone tapers after that.  He now returns for follow-up.  He is not having any significant incisional pain.  He does complain of some intermittent numbness in his toes.  He was having it in his fingers as well but that has resolved.  Past Medical History:  Diagnosis Date  . Bronchitis   . Hypertension   . NSTEMI (non-ST elevated myocardial infarction) [facility])     Current Outpatient Medications  Medication Sig Dispense Refill  . aspirin EC 325 MG EC tablet Take 1 tablet (325 mg total) by mouth daily. 30 tablet 0  . carvedilol (COREG) 25 MG tablet Take 1 tablet (25 mg total) by mouth 2 (two) times daily. 60 tablet 3  . cloNIDine (CATAPRES) 0.2 MG tablet Take 1 tablet (0.2 mg total) by mouth 2 (two) times daily. 60 tablet 3  . hydrochlorothiazide (HYDRODIURIL) 25 MG tablet Take 1 tablet (25 mg total) by mouth daily. 30 tablet 5  . lisinopril (PRINIVIL,ZESTRIL) 40 MG tablet Take 1 tablet (40 mg total) by mouth daily. 30 tablet 5  . predniSONE (STERAPRED UNI-PAK 21 TAB) 10 MG (21) TBPK tablet Take 6 tablets p.o. day 1, 5 on day 2, 4 on day 3, 3 on day 4, 2 on day 5, and 1 on day 6 (Patient not taking: Reported on 03/02/2019) 21 tablet 0   No current facility-administered medications for this visit.     Physical Exam BP 140/88   Pulse 70    Temp 97.7 F (36.5 C) (Skin)   Resp 20   Ht 6' (1.829 m)   Wt 210 lb (95.3 kg)   SpO2 99% Comment: RA  BMI 28.36 kg/m  43 year old man in no acute distress Alert and oriented x3 with no focal deficits Lungs clear with equal breath sounds bilaterally Cardiac regular rate and rhythm normal S1 and S2 with no rubs murmurs or gallops Sternum stable No peripheral edema DP pulses 2+, posterior tibial 1+ bilaterally, toes cool to touch  Diagnostic Tests: CHEST - 2 VIEW  COMPARISON:  Prior chest x-ray 01/19/2019  FINDINGS: Patient is status post median sternotomy. The cardiac and mediastinal contours remain unchanged. Trace residual chronic right pleural effusion. The lungs are otherwise clear. No acute osseous abnormality.  IMPRESSION: 1. Trace residual chronic right pleural effusion. 2. Otherwise, no acute cardiopulmonary process.   Electronically Signed   By: Malachy Moan M.D.   On: 03/02/2019 13:37 I personally reviewed the chest x-ray images and concur with the findings noted above  Impression: Stephen West is a 54 year old man with a history of hypertension who presented with a type I aortic dissection back in February.  He underwent emergent hemi-arch repair.  He now  is about 3 months out from surgery and overall is doing well.  Postoperative pleural effusion-has minimal residual fluid.  No further follow-up necessary.  Postoperative atrial fibrillation-stable with no evidence of arrhythmias now off amiodarone for 6 weeks.  Numbness-initially was fingers and toes.  Now mostly the toes and forefoot.  He does have good pulses so I do not think there is any vascular compromise.  Differential diagnosis is neuropathy versus vasospasm.  The intermittent nature would argue against neuropathy.  I advised him to keep his toes warm and he says that does seem to help.  We will reevaluate that when he comes back and has his repeat CT angiogram.  He is okay to return to  work once that opportunity arises.  Plan: Return in 3 months with CT Angio of chest for six-month follow-up after initial dissection.  Loreli SlotSteven C Jocelin Schuelke, MD [facility] [phone]

## 2019-03-08 ENCOUNTER — Telehealth (HOSPITAL_COMMUNITY): Payer: Self-pay | Admitting: *Deleted

## 2019-03-08 NOTE — Telephone Encounter (Signed)
Left message for patient to regarding Cardiac Rehab referral. Artist Pais, MS, ACSM CEP [phone] 03/08/2019

## 2019-03-30 ENCOUNTER — Telehealth: Payer: Self-pay | Admitting: *Deleted

## 2019-03-30 NOTE — Telephone Encounter (Signed)
Lvm on  cell to cal back about consent for virtual visit  

## 2019-03-31 NOTE — Telephone Encounter (Signed)
Spoke with patient and patient consent to telephone  Virtual Visit Pre-Appointment Phone Call  "(Name), I am calling you today to discuss your upcoming appointment. We are currently trying to limit exposure to the virus that causes COVID-19 by seeing patients at home rather than in the office."  1. "What is the BEST phone number to call the day of the visit?" - include this in appointment notes  2. "Do you have or have access to (through a family member/friend) a smartphone with video capability that we can use for your visit?" a. If yes - list this number in appt notes as "cell" (if different from BEST phone #) and list the appointment type as a VIDEO visit in appointment notes b. If no - list the appointment type as a PHONE visit in appointment notes  3. Confirm consent - "In the setting of the current Covid19 crisis, you are scheduled for a (phone or video) visit with your provider on (date) at (time).  Just as we do with many in-office visits, in order for you to participate in this visit, we must obtain consent.  If you'd like, I can send this to your mychart (if signed up) or email for you to review.  Otherwise, I can obtain your verbal consent now.  All virtual visits are billed to your insurance company just like a normal visit would be.  By agreeing to a virtual visit, we'd like you to understand that the technology does not allow for your provider to perform an examination, and thus may limit your provider's ability to fully assess your condition. If your provider identifies any concerns that need to be evaluated in person, we will make arrangements to do so.  Finally, though the technology is pretty good, we cannot assure that it will always work on either your or our end, and in the setting of a video visit, we may have to convert it to a phone-only visit.  In either situation, we cannot ensure that we have a secure connection.  Are you willing to proceed?" STAFF: Did the patient verbally  acknowledge consent to telehealth visit? Document YES/NO here: YES    TELEPHONE CALL NOTE  Stephen West has been deemed a candidate for a follow-up tele-health visit to limit community exposure during the Covid-19 pandemic. I spoke with the patient via phone to ensure availability of phone/video source, confirm preferred email & phone number, and discuss instructions and expectations.  I reminded Stephen West to be prepared with any vital sign and/or heart rhythm information that could potentially be obtained via home monitoring, at the time of his visit. I reminded Stephen West to expect a phone call prior to his visit.  Claude Manges, [place] 03/31/2019 1:38 PM    FULL LENGTH CONSENT FOR TELE-HEALTH VISIT   I hereby voluntarily request, consent and authorize [facility] and its employed or contracted physicians, physician assistants, nurse practitioners or other licensed health care professionals (the Practitioner), to provide me with telemedicine health care services (the "Services") as deemed necessary by the treating Practitioner. I acknowledge and consent to receive the Services by the Practitioner via telemedicine. I understand that the telemedicine visit will involve communicating with the Practitioner through live audiovisual communication technology and the disclosure of certain medical information by electronic transmission. I acknowledge that I have been given the opportunity to request an in-person assessment or other available alternative prior to the telemedicine visit and am voluntarily participating in the telemedicine visit.  I  understand that I have the right to withhold or withdraw my consent to the use of telemedicine in the course of my care at any time, without affecting my right to future care or treatment, and that the Practitioner or I may terminate the telemedicine visit at any time. I understand that I have the right to inspect all information obtained and/or  recorded in the course of the telemedicine visit and may receive copies of available information for a reasonable fee.  I understand that some of the potential risks of receiving the Services via telemedicine include:  Marland Kitchen. Delay or interruption in medical evaluation due to technological equipment failure or disruption; . Information transmitted may not be sufficient (e.g. poor resolution of images) to allow for appropriate medical decision making by the Practitioner; and/or  . In rare instances, security protocols could fail, causing a breach of personal health information.  Furthermore, I acknowledge that it is my responsibility to provide information about my medical history, conditions and care that is complete and accurate to the best of my ability. I acknowledge that Practitioner's advice, recommendations, and/or decision may be based on factors not within their control, such as incomplete or inaccurate data provided by me or distortions of diagnostic images or specimens that may result from electronic transmissions. I understand that the practice of medicine is not an exact science and that Practitioner makes no warranties or guarantees regarding treatment outcomes. I acknowledge that I will receive a copy of this consent concurrently upon execution via email to the email address I last provided but may also request a printed copy by calling the office of [facility].    I understand that my insurance will be billed for this visit.   I have read or had this consent read to me. . I understand the contents of this consent, which adequately explains the benefits and risks of the Services being provided via telemedicine.  . I have been provided ample opportunity to ask questions regarding this consent and the Services and have had my questions answered to my satisfaction. . I give my informed consent for the services to be provided through the use of telemedicine in my medical care  By participating  in this telemedicine visit I agree to the above.

## 2019-04-01 ENCOUNTER — Telehealth (HOSPITAL_COMMUNITY): Payer: Self-pay

## 2019-04-01 NOTE — Telephone Encounter (Signed)
Phone pt regarding interest in Virtual Cardiac Rehab. Pt is not interested in virtual or in person cardiac rehab. Pt states "he doesn't feel like he needs rehab anymore". Pt has returned back to work.    Carma Lair MS, ACSM CEP 1:21 PM 04/01/2019

## 2019-04-01 NOTE — Progress Notes (Unsigned)
{Choose 1 Note Type (Telehealth Visit or Telephone Visit):[phone]}   Date:  04/01/2019   ID:  Stephen West, DOB 1976/06/08, MRN [id number]  {Patient Location:[phone]::"Home"} {Provider Location:[phone]::"Home"}  PCP:  Loletta SpecterGomez, Roger David, PA-C  Cardiologist:  Kristeen MissPhilip Nahser, MD *** Electrophysiologist:  None   Evaluation Performed:  {Choose Visit [phone]::"Follow-Up Visit"}  Chief Complaint:  ***  History of Present Illness:    Stephen Salinesaron D Paddock is a 43 y.o. male with ***  Type I aortic dissection 11/2018 status post hemi-arch repair  Right pleural effusion status post thoracentesis 12/31/2018  Postop atrial fibrillation converted on amiodarone  Hypertension  Cocaine abuse  Tobacco abuse  He was last seen in clinic in 12/2018 by Manson PasseyBhavinkumar Bhagat, PA-C.  ***  The patient {does/does not:200015} have symptoms concerning for COVID-19 infection (fever, chills, cough, or new shortness of breath).    Past Medical History:  Diagnosis Date  . Bronchitis   . Hypertension   . NSTEMI (non-ST elevated myocardial infarction) [facility](HCC)    Past Surgical History:  Procedure Laterality Date  . BENTALL PROCEDURE N/A 11/24/2018   Procedure: HEMIARCH REPAIR OF TYPE ONE AORTIC DISSECTION WITH ANTIGRADE CEREBRAL PERFUSSION AND MODERATE HYPOTHERMIC CIRCULATORY ARREST.;  Surgeon: Loreli SlotHendrickson, Steven C, MD;  Location: [facility] OR;  Service: Open Heart Surgery;  Laterality: N/A;  . DENTAL SURGERY    . IR THORACENTESIS ASP PLEURAL SPACE W/IMG GUIDE  12/31/2018     No outpatient medications have been marked as taking for the 04/02/19 encounter (Appointment) with Tereso NewcomerWeaver, Mckaila Duffus T, PA-C.     Allergies:   Carrot [daucus carota] and Banana   Social History   Tobacco Use  . Smoking status: Current Every Day Smoker    Packs/day: 0.50    Types: Cigarettes  . Smokeless tobacco: Never Used  Substance Use Topics  . Alcohol use: Not Currently    Alcohol/week: 5.0 standard drinks    Types: 5  Cans of beer per week    Comment: occ  . Drug use: Yes    Types: Marijuana    Comment: previous cocaine use     Family Hx: The patient's family history includes Diabetes Mellitus II in his mother; Hypertension in his mother.  ROS:   Please see the history of present illness.    *** All other systems reviewed and are negative.   Prior CV studies:   The following studies were reviewed today:  *** Intraoperative TEE 11/24/2018 Normal cavity size and left atrial pressure. Concentric hypertrophy of severe severity. LV systolic function is normal with an EF of 65-70%.  Grade II (moderate, pseudorelaxation) left ventricular diastolic dysfunction. There are no obvious wall motion abnormalities.   Echo 04/07/2017 Prominent hypertrophied apical LV segments, EF 55-65, grade 2 diastolic dysfunction, borderline dilated aortic root (37 mm), PASP 20  Labs/Other Tests and Data Reviewed:    EKG:  {EKG/Telemetry Strips Reviewed:[id number]}  Recent Labs: 11/25/2018: Magnesium 2.9 01/11/2019: ALT 21; B Natriuretic Peptide 146.0; BUN 8; Creatinine, Ser 1.20; Hemoglobin 12.8; Platelets 354; Potassium 3.8; Sodium 138   Recent Lipid Panel Lab Results  Component Value Date/Time   CHOL 138 04/07/2017 04:58 AM   TRIG 104 04/07/2017 04:58 AM   HDL 58 04/07/2017 04:58 AM   CHOLHDL 2.4 04/07/2017 04:58 AM   LDLCALC 59 04/07/2017 04:58 AM    Wt Readings from Last 3 Encounters:  03/02/19 210 lb (95.3 kg)  01/19/19 210 lb 3.2 oz (95.3 kg)  01/11/19 215 lb (97.5 kg)     Objective:  Vital Signs:  There were no vitals taken for this visit.   {HeartCare Virtual Exam (Optional):[id number]::"VITAL SIGNS:  reviewed"}  ASSESSMENT & PLAN:    1. *** Essential hypertension - Plan: ***  Aortic dissection, thoracic (HCC) - Plan: ***  Polysubstance abuse ([place]) - Plan: ***  Educated About Covid-19 Virus Infection - Plan: ***   COVID-19 Education: The signs and symptoms of COVID-19 were discussed  with the patient and how to seek care for testing (follow up with PCP or arrange E-visit).  ***The importance of social distancing was discussed today.  Time:   Today, I have spent *** minutes with the patient with telehealth technology discussing the above problems.     Medication Adjustments/Labs and Tests Ordered: Current medicines are reviewed at length with the patient today.  Concerns regarding medicines are outlined above.   Tests Ordered: No orders of the defined types were placed in this encounter.   Medication Changes: No orders of the defined types were placed in this encounter.   Follow Up:  {F/U [phone]} {follow up:15908}  Signed, Richardson Dopp, PA-C  04/01/2019 7:19 PM    [facility]

## 2019-04-02 ENCOUNTER — Telehealth: Payer: BLUE CROSS/BLUE SHIELD | Admitting: Physician Assistant

## 2019-04-02 ENCOUNTER — Other Ambulatory Visit: Payer: Self-pay

## 2019-04-02 DIAGNOSIS — Z7189 Other specified counseling: Secondary | ICD-10-CM

## 2019-04-02 DIAGNOSIS — I71019 Dissection of thoracic aorta, unspecified: Secondary | ICD-10-CM

## 2019-04-02 DIAGNOSIS — F1910 Other psychoactive substance abuse, uncomplicated: Secondary | ICD-10-CM

## 2019-04-02 DIAGNOSIS — I10 Essential (primary) hypertension: Secondary | ICD-10-CM

## 2019-04-02 DIAGNOSIS — I7101 Dissection of thoracic aorta: Secondary | ICD-10-CM

## 2019-04-26 ENCOUNTER — Other Ambulatory Visit: Payer: Self-pay | Admitting: Thoracic Surgery (Cardiothoracic Vascular Surgery)

## 2019-04-26 DIAGNOSIS — I71019 Dissection of thoracic aorta, unspecified: Secondary | ICD-10-CM

## 2019-04-26 DIAGNOSIS — I7101 Dissection of thoracic aorta: Secondary | ICD-10-CM

## 2019-05-20 ENCOUNTER — Other Ambulatory Visit: Payer: BLUE CROSS/BLUE SHIELD

## 2019-05-25 ENCOUNTER — Ambulatory Visit: Payer: BLUE CROSS/BLUE SHIELD | Admitting: Thoracic Surgery (Cardiothoracic Vascular Surgery)

## 2019-10-20 ENCOUNTER — Other Ambulatory Visit: Payer: Self-pay | Admitting: Cardiology

## 2019-10-20 DIAGNOSIS — Z20822 Contact with and (suspected) exposure to covid-19: Secondary | ICD-10-CM

## 2019-10-22 LAB — NOVEL CORONAVIRUS, NAA: SARS-CoV-2, NAA: NOT DETECTED

## 2019-11-28 ENCOUNTER — Emergency Department (HOSPITAL_COMMUNITY): Payer: BLUE CROSS/BLUE SHIELD

## 2019-11-28 ENCOUNTER — Emergency Department (HOSPITAL_COMMUNITY)
Admission: EM | Admit: 2019-11-28 | Discharge: 2019-11-28 | Disposition: A | Discharge status: Home / Self Care | Payer: BLUE CROSS/BLUE SHIELD | Attending: Emergency Medicine | Admitting: Emergency Medicine

## 2019-11-28 ENCOUNTER — Other Ambulatory Visit: Payer: Self-pay

## 2019-11-28 DIAGNOSIS — J45909 Unspecified asthma, uncomplicated: Secondary | ICD-10-CM | POA: Diagnosis not present

## 2019-11-28 DIAGNOSIS — I10 Essential (primary) hypertension: Secondary | ICD-10-CM | POA: Diagnosis not present

## 2019-11-28 DIAGNOSIS — S61215A Laceration without foreign body of left ring finger without damage to nail, initial encounter: Secondary | ICD-10-CM | POA: Diagnosis not present

## 2019-11-28 DIAGNOSIS — Y93G1 Activity, food preparation and clean up: Secondary | ICD-10-CM | POA: Insufficient documentation

## 2019-11-28 DIAGNOSIS — Y929 Unspecified place or not applicable: Secondary | ICD-10-CM | POA: Diagnosis not present

## 2019-11-28 DIAGNOSIS — Z23 Encounter for immunization: Secondary | ICD-10-CM | POA: Insufficient documentation

## 2019-11-28 DIAGNOSIS — Z7982 Long term (current) use of aspirin: Secondary | ICD-10-CM | POA: Insufficient documentation

## 2019-11-28 DIAGNOSIS — W260XXA Contact with knife, initial encounter: Secondary | ICD-10-CM | POA: Diagnosis not present

## 2019-11-28 DIAGNOSIS — Y999 Unspecified external cause status: Secondary | ICD-10-CM | POA: Diagnosis not present

## 2019-11-28 DIAGNOSIS — Z79899 Other long term (current) drug therapy: Secondary | ICD-10-CM | POA: Insufficient documentation

## 2019-11-28 DIAGNOSIS — F17210 Nicotine dependence, cigarettes, uncomplicated: Secondary | ICD-10-CM | POA: Insufficient documentation

## 2019-11-28 MED ORDER — LIDOCAINE HCL (PF) 1 % IJ SOLN
5.0000 mL | Freq: Once | INTRAMUSCULAR | Status: AC
  Administered 2019-11-28: 5 mL
  Filled 2019-11-28: qty 5

## 2019-11-28 MED ORDER — "THROMBI-PAD 3""X3"" EX PADS"
1.0000 | MEDICATED_PAD | Freq: Once | CUTANEOUS | Status: AC
  Administered 2019-11-28: 1 via TOPICAL
  Filled 2019-11-28: qty 1

## 2019-11-28 MED ORDER — TETANUS-DIPHTH-ACELL PERTUSSIS 5-2.5-18.5 LF-MCG/0.5 IM SUSP
0.5000 mL | Freq: Once | INTRAMUSCULAR | Status: AC
  Administered 2019-11-28: 0.5 mL via INTRAMUSCULAR
  Filled 2019-11-28: qty 0.5

## 2019-11-28 NOTE — ED Notes (Signed)
Discharge instructions discussed with pt. Pt verbalized understanding with no questions at this time.   Discussed wound dressing care with pt. Pt to replace if saturated. Pt given coban, thrombi pad, and 2x2 dressing.   Pt to go home with wife

## 2019-11-28 NOTE — ED Triage Notes (Signed)
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 Pt said tip of finger was cut pretty good. Was wrapped more to secure bleeding. Pt said does not know when last Tetanus was.

## 2019-11-28 NOTE — Discharge Instructions (Signed)
Leave the current bandage in place for the next 24 hours. If there is bleeding that saturates through the bandage, change the dressing applying another piece of the Thrombi-Pad, and then leave in place.

## 2019-11-28 NOTE — ED Provider Notes (Signed)
[facility] EMERGENCY DEPARTMENT Provider Note   CSN: [id number] Arrival date & time: 11/28/19  0003     History Chief Complaint  Patient presents with  . Extremity Laceration    ring finger left hand    Stephen West is a 44 y.o. male.  Patient here with laceration to distal left 4th finger sustained with cutting cooked chicken with a sharp knife. No other injury. Not on blood thinners. Tetanus status unknown.   The history is provided by the patient. No language interpreter was used.       Past Medical History:  Diagnosis Date  . Bronchitis   . Hypertension   . NSTEMI (non-ST elevated myocardial infarction) [facility])     Patient Active Problem List   Diagnosis Date Noted  . Encounter for medication refill 12/20/2018  . Aortic dissection, thoracic (HCC) 11/24/2018  . Polysubstance abuse (HCC) 04/07/2017  . Tobacco abuse 04/07/2017  . Hypertensive emergency 04/07/2017  . Hypokalemia 04/07/2017  . Hip pain 01/12/2014  . Sinus congestion 01/12/2014  . Asthma 01/06/2014  . HTN (hypertension) 12/30/2013  . Borderline hyperglycemia 12/30/2013  . Chronic generalized abdominal pain 12/30/2013  . Headache 12/30/2013  . Urinary frequency 12/30/2013  . Non-suicidal depressed mood 12/30/2013  . Tobacco abuse counseling 12/30/2013  . Screening for hyperlipidemia 12/30/2013    Past Surgical History:  Procedure Laterality Date  . BENTALL PROCEDURE N/A 11/24/2018   Procedure: HEMIARCH REPAIR OF TYPE ONE AORTIC DISSECTION WITH ANTIGRADE CEREBRAL PERFUSSION AND MODERATE HYPOTHERMIC CIRCULATORY ARREST.;  Surgeon: Loreli Slot, MD;  Location: [facility] OR;  Service: Open Heart Surgery;  Laterality: N/A;  . DENTAL SURGERY    . IR THORACENTESIS ASP PLEURAL SPACE W/IMG GUIDE  12/31/2018       Family History  Problem Relation Age of Onset  . Hypertension Mother   . Diabetes Mellitus II Mother     Social History   Tobacco Use  . Smoking status: Current  Every Day Smoker    Packs/day: 0.50    Types: Cigarettes  . Smokeless tobacco: Never Used  Substance Use Topics  . Alcohol use: Not Currently    Alcohol/week: 5.0 standard drinks    Types: 5 Cans of beer per week    Comment: occ  . Drug use: Yes    Types: Marijuana    Comment: previous cocaine use    Home Medications Prior to Admission medications   Medication Sig Start Date End Date Taking? Authorizing Provider  aspirin EC 325 MG EC tablet Take 1 tablet (325 mg total) by mouth daily. 11/30/18   Barrett, Erin R, PA-C  carvedilol (COREG) 25 MG tablet Take 1 tablet (25 mg total) by mouth 2 (two) times daily. 11/30/18   Barrett, Erin R, PA-C  cloNIDine (CATAPRES) 0.2 MG tablet Take 1 tablet (0.2 mg total) by mouth 2 (two) times daily. 12/29/18   Loreli Slot, MD  hydrochlorothiazide (HYDRODIURIL) 25 MG tablet Take 1 tablet (25 mg total) by mouth daily. 07/16/18   Loletta Specter, PA-C  lisinopril (PRINIVIL,ZESTRIL) 40 MG tablet Take 1 tablet (40 mg total) by mouth daily. 12/29/18   Loreli Slot, MD  predniSONE (STERAPRED UNI-PAK 21 TAB) 10 MG (21) TBPK tablet Take 6 tablets p.o. day 1, 5 on day 2, 4 on day 3, 3 on day 4, 2 on day 5, and 1 on day 6 Patient not taking: Reported on 03/02/2019 01/19/19   Loreli Slot, MD    Allergies  Carrot [daucus carota] and Banana  Review of Systems   Review of Systems  Musculoskeletal:       See HPI.  Skin: Positive for wound.  Neurological: Negative for numbness.    Physical Exam Updated Vital Signs BP (!) 189/137 (BP Location: Right Arm)   Pulse 91   Temp 98.1 F (36.7 C) (Oral)   Resp 18   Ht 6' (1.829 m)   Wt 97.5 kg   SpO2 100%   BMI 29.16 kg/m   Physical Exam Musculoskeletal:        General: No swelling. Normal range of motion.  Skin:    Comments: Avulsion to distal left 4th finger, actively bleeding. Limited to dermal layers. Nail intact.      ED Results / Procedures / Treatments   Labs (all  labs ordered are listed, but only abnormal results are displayed) Labs Reviewed - No data to display  EKG None  Radiology No results found.  Procedures Procedures (including critical care time)  Medications Ordered in ED Medications - No data to display  ED Course  I have reviewed the triage vital signs and the nursing notes.  Pertinent labs & imaging results that were available during my care of the patient were reviewed by me and considered in my medical decision making (see chart for details).    MDM Rules/Calculators/A&P                      Patient to ED with left 4th finger injury.   The wound is not amenable to sutured closure. Thrombi-Pad applied with improvement to bleeding. Care instructions discussed. He is discharged with remaining Thrombi-pad and dressing supplies.  Tetanus updated.   Final Clinical Impression(s) / ED Diagnoses Final diagnoses:  None   1. Laceration left 4th finger  Rx / DC Orders ED Discharge Orders    None       Charlann Lange, Hershal Coria 11/28/19 0310    Mesner, Corene Cornea, MD 11/28/19 [phone]

## 2020-03-27 IMAGING — DX DG CHEST 1V PORT
1 series · 1 of 1 positions shown · non-contrast
Comparison: Chest CTA dated 11/24/2018 and portable chest dated
11/24/2018.

CLINICAL DATA: Status post surgery for aneurysm and dissection of
the ascending thoracic aorta.

EXAM:
PORTABLE CHEST 1 VIEW

[chest ap]
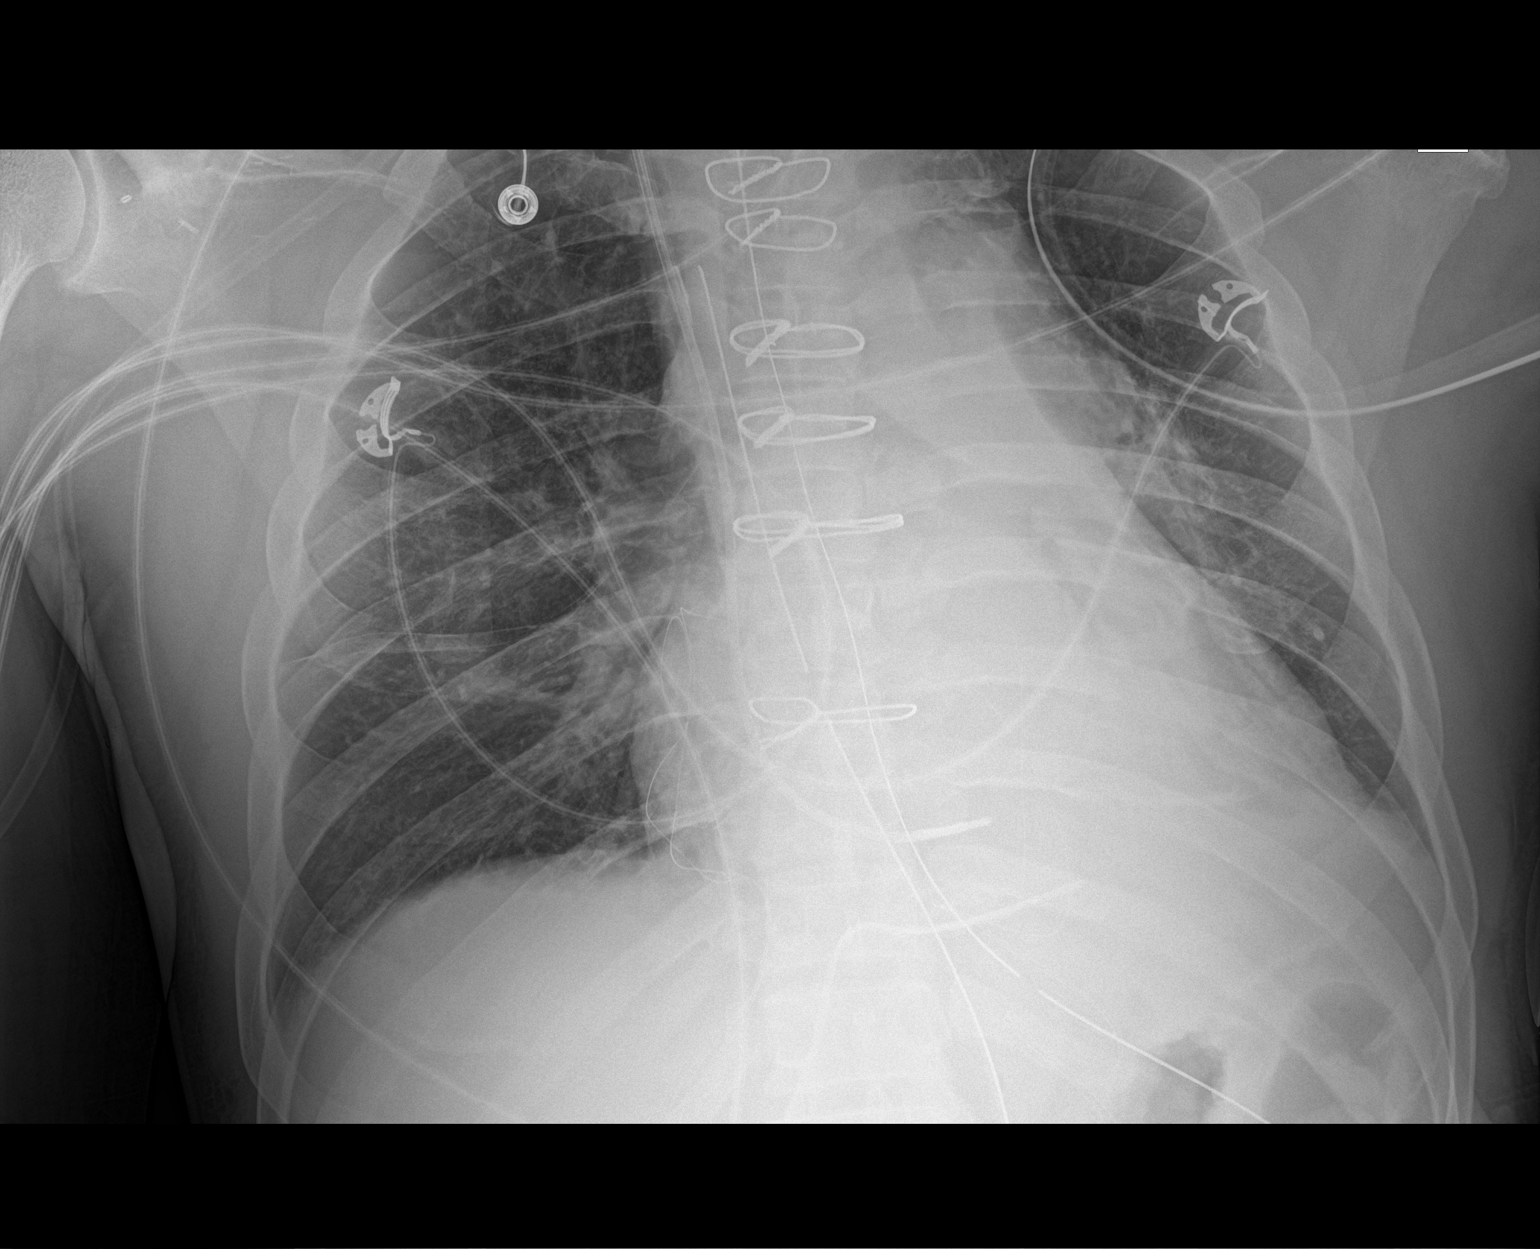

[1 of 1 positions shown; findings below may reference images not displayed]

FINDINGS: Interval endotracheal tube in satisfactory position. Interval
nasogastric tube with its tip and side hole in the stomach. Interval
median sternotomy wires and mediastinal tubes. Interval right
jugular probable Swan-Ganz catheter with its tip in the proximal
right ventricle.

Mildly progressive enlargement of the cardiac silhouette. No
pneumothorax seen. Interval left lower lobe atelectasis and mild
right mid to lower lung zone atelectasis. Interval postsurgical
widening of the superior mediastinum or adjacent atelectasis and
obscuration of the aortic arch. Multiple small surgical clips
overlying the right shoulder area.
IMPRESSION: 1. Postoperative changes, as described above.
2. Interval left lower lobe atelectasis and mild right mid to lower
lung zone atelectasis.

## 2020-03-27 IMAGING — CT CT ANGIO CHEST
2 of 6 series · 18 of 46 positions shown · IV contrast (OMNI 350)
Comparison: Chest radiograph, 11/24/2018

CLINICAL DATA: Chest pain

EXAM:
CT ANGIOGRAPHY CHEST WITH CONTRAST
TECHNIQUE: Multidetector CT imaging of the chest was performed using the
standard protocol during bolus administration of intravenous
contrast. Multiplanar CT image reconstructions and MIPs were
obtained to evaluate the vascular anatomy.
CONTRAST:  <See Chart> [id number]-[id number] IOPAMIDOL ([id number]-LSZ) INJECTION
76%

[Series 5: dissection 2mm · axial · 0.84mm/px · z∈[+944,+1294]mm · 15 of 199 slices shown]
[im 12/199  lung]
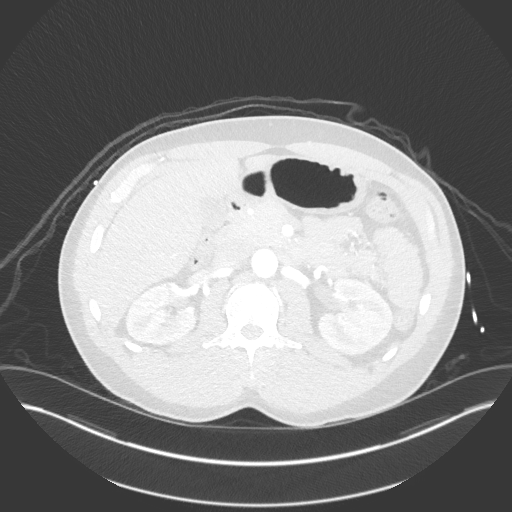
[im 24/199  soft-tissue]
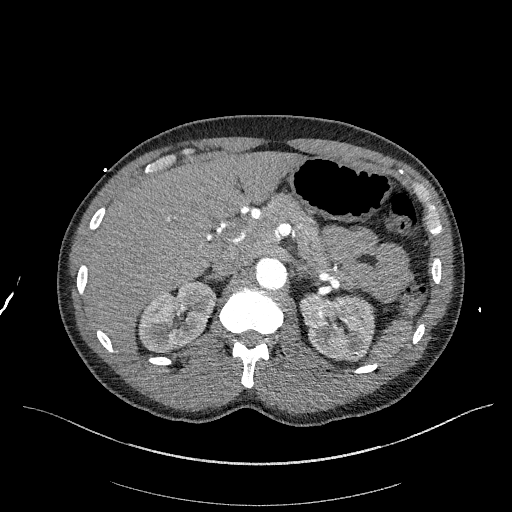
[im 35/199  lung]
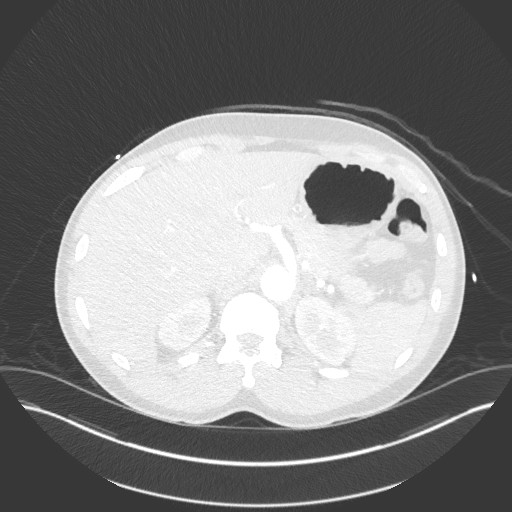
[im 47/199  soft-tissue]
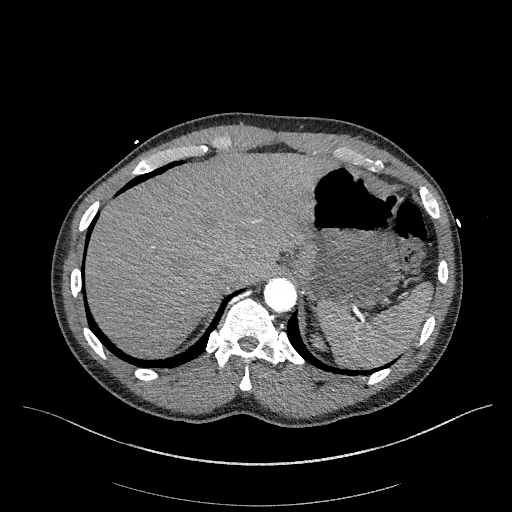
[im 59/199  lung]
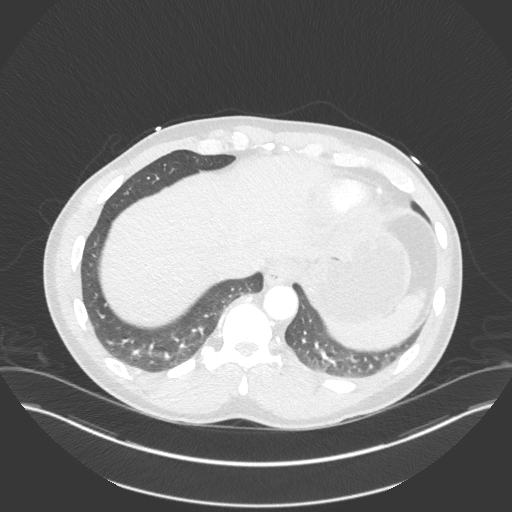
[im 70/199  soft-tissue]
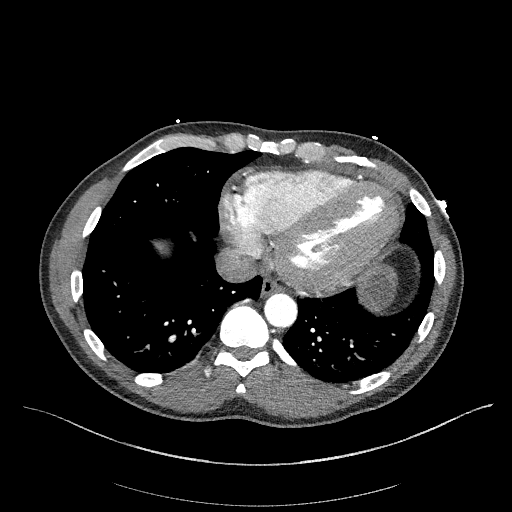
[im 82/199  lung]
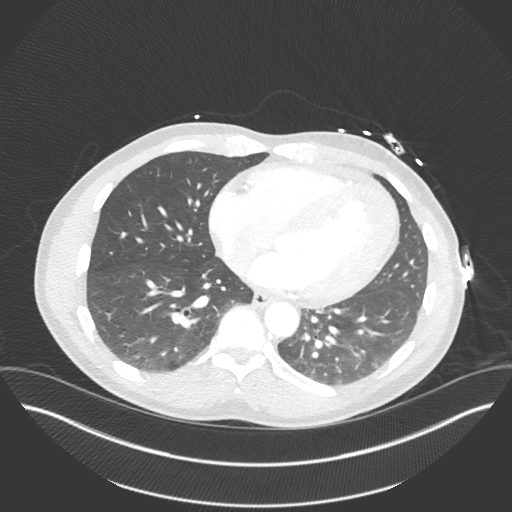
[im 105/199  soft-tissue]
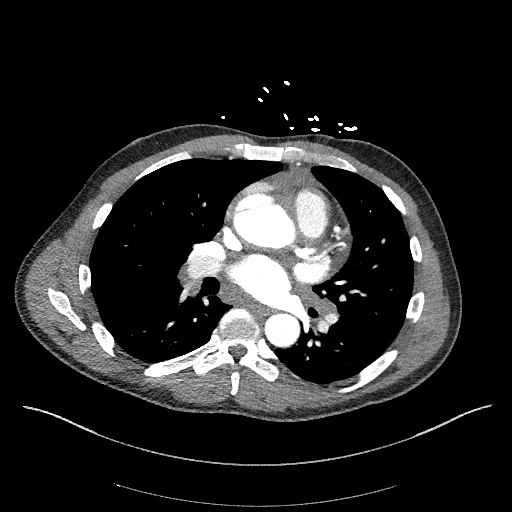
[im 117/199  lung]
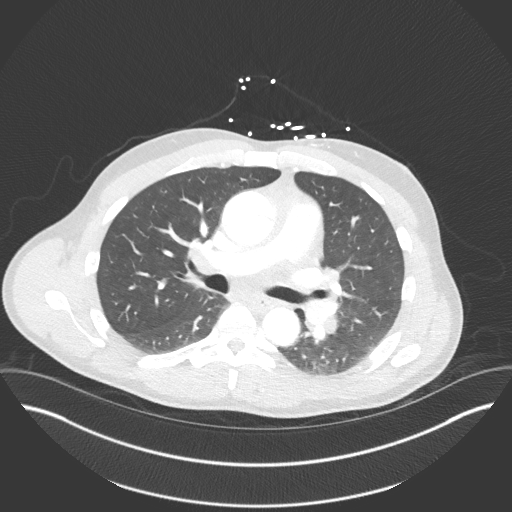
[im 129/199  soft-tissue]
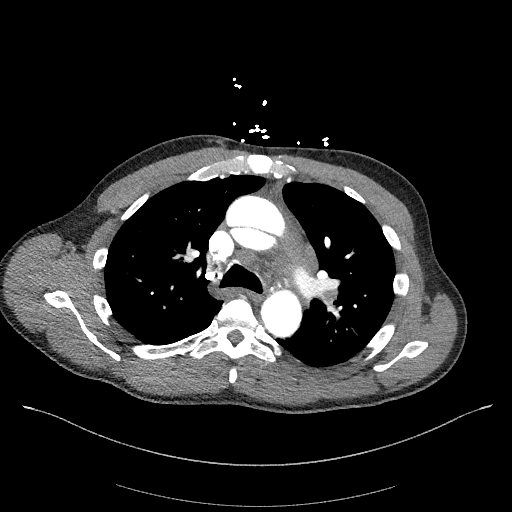
[im 140/199  lung]
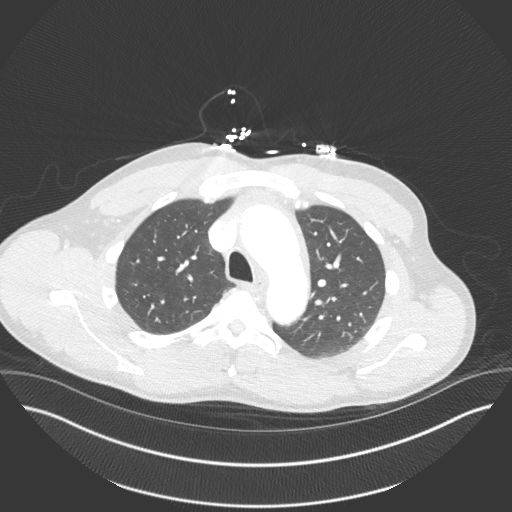
[im 152/199  soft-tissue]
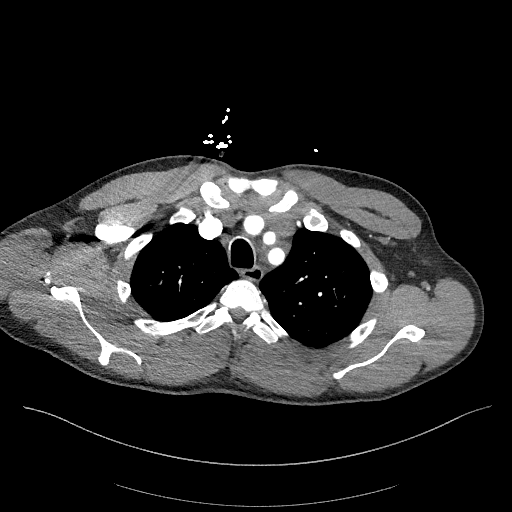
[im 164/199  lung]
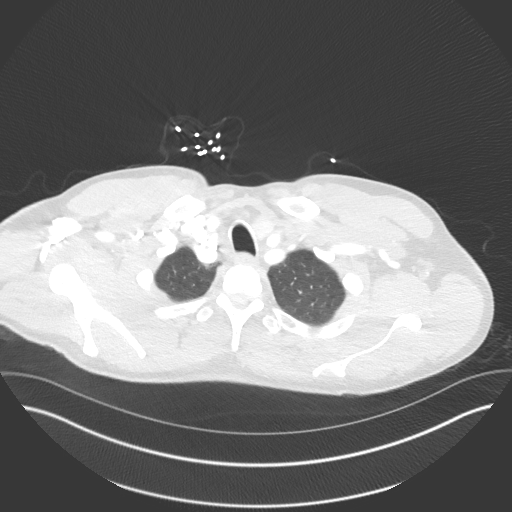
[im 175/199  soft-tissue]
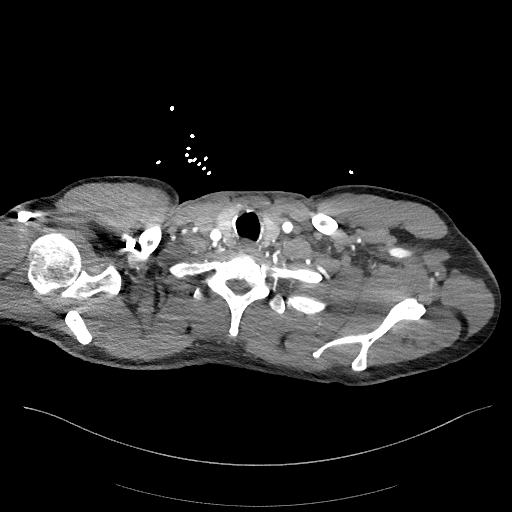
[im 187/199  lung]
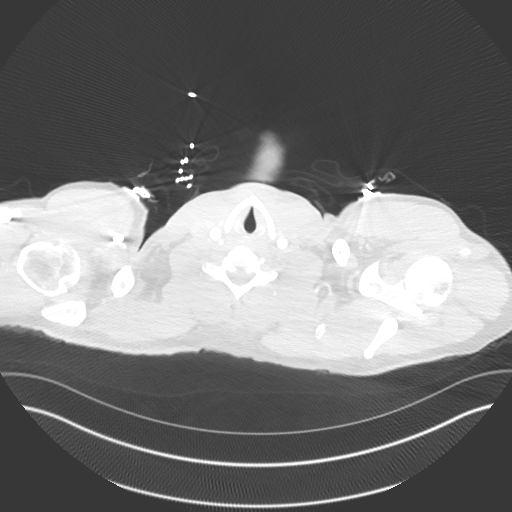

[Series 8: dissection 2mm cor · coronal · 0.69mm/px · 3 of 129 slices shown]
[im 33/129  soft-tissue]
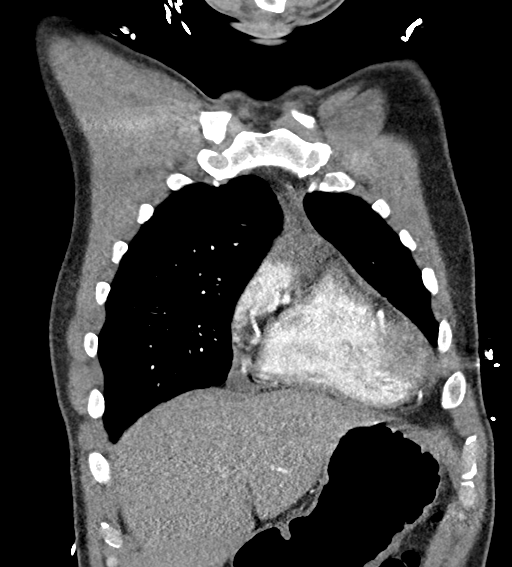
[im 65/129  soft-tissue]
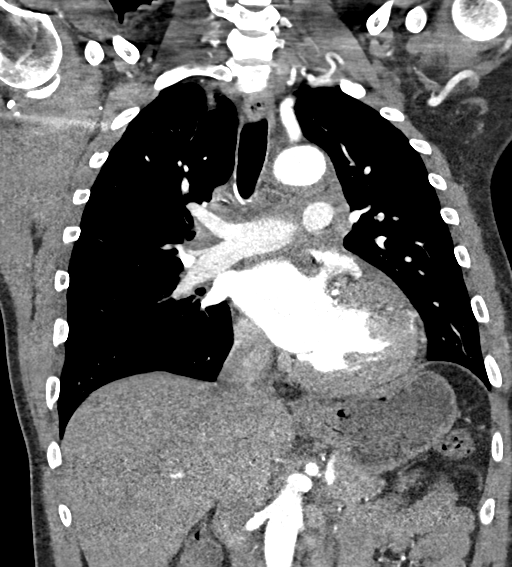
[im 97/129  soft-tissue]
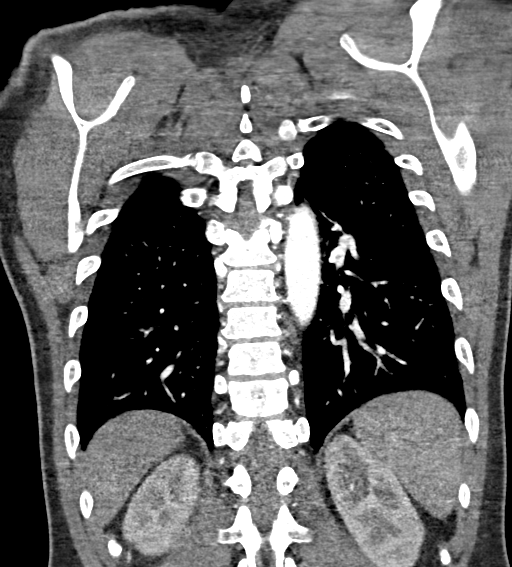

[18 of 46 positions shown; findings below may reference images not displayed]

FINDINGS: Cardiovascular: Preferential opacification of the thoracic aorta.
There is dissection and aneurysm of the tubular ascending thoracic
aorta measuring 4.4 x 4.4 cm in caliber. There is a large
fenestration which terminates the dissection flap at the proximal
aortic arch and the dissection flap does not involve the arch
vessels or coronary sinuses. Normal heart size. No pericardial
effusion.

Mediastinum/Nodes: No enlarged mediastinal, hilar, or axillary lymph
nodes. Thyroid gland, trachea, and esophagus demonstrate no
significant findings.

Lungs/Pleura: Lungs are clear. No pleural effusion or pneumothorax.

Upper Abdomen: No acute abnormality.

Musculoskeletal: No chest wall abnormality. No acute or significant
osseous findings.

Review of the MIP images confirms the above findings.

Review of the MIP images confirms the above findings.
IMPRESSION: There is dissection and aneurysm of the tubular ascending thoracic
aorta measuring 4.4 x 4.4 cm in caliber. There is a large
fenestration which terminates the dissection flap at the proximal
aortic arch and the dissection flap does not involve the arch
vessels or coronary sinuses.

These results were called by telephone at the time of interpretation
on 11/24/2018 at [DATE] to Dr. GRETA EMILIJA TALIJUNIENE , who verbally
acknowledged these results.

## 2020-03-28 IMAGING — DX DG CHEST 1V PORT
1 series · 1 of 1 positions shown · non-contrast
Comparison: Yesterday

CLINICAL DATA: Chest tube.

EXAM:
PORTABLE CHEST 1 VIEW

[chest ap]
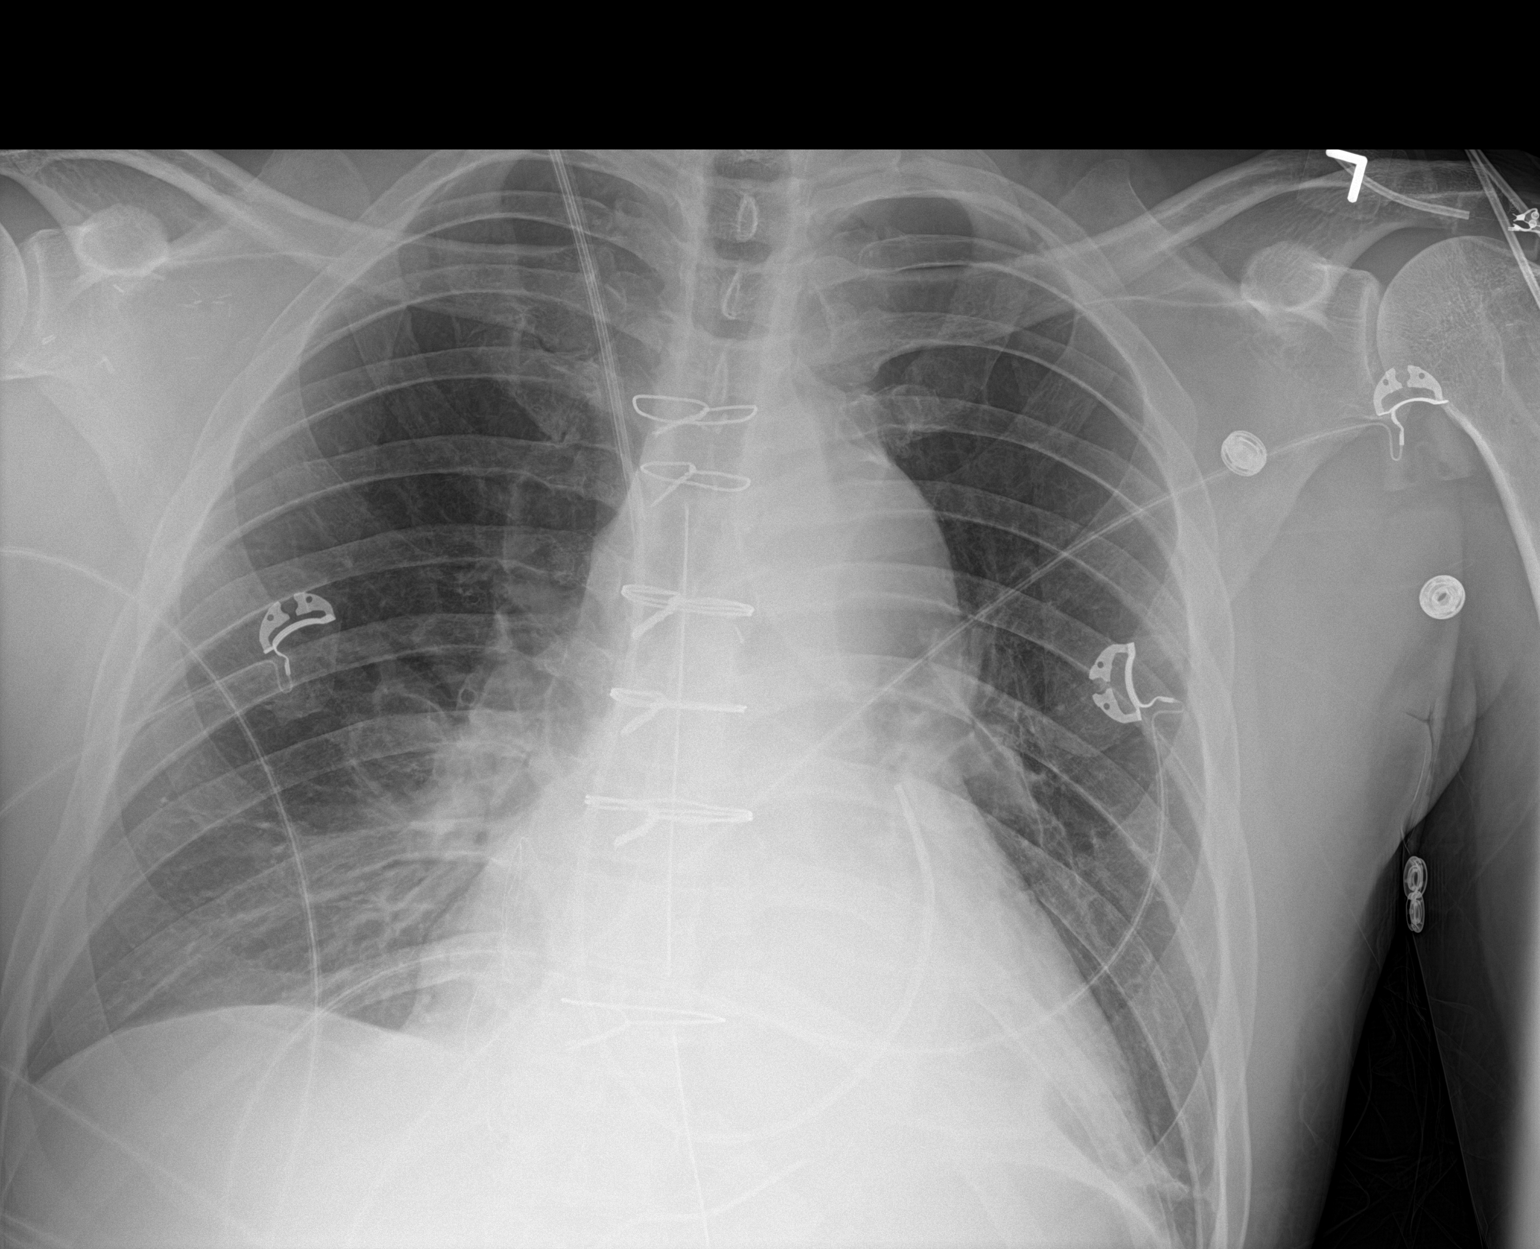

[1 of 1 positions shown; findings below may reference images not displayed]

FINDINGS: Interval tracheal and esophageal extubation. Stable
cardiomediastinal contours in this patient with repaired aortic
dissection. Swan-Ganz catheter from the right with tip at the
pulmonary artery outflow tract. Midline chest tube persists. No
pneumothorax. Improved atelectasis, especially on the left. No
pulmonary edema
IMPRESSION: 1. Clearing atelectasis.
2. No visible pneumothorax.

## 2020-03-29 IMAGING — DX DG CHEST 1V PORT
1 series · 1 of 1 positions shown · non-contrast
Comparison: 11/25/2018

CLINICAL DATA: Follow-up aortic repair

EXAM:
PORTABLE CHEST 1 VIEW

[chest ap]
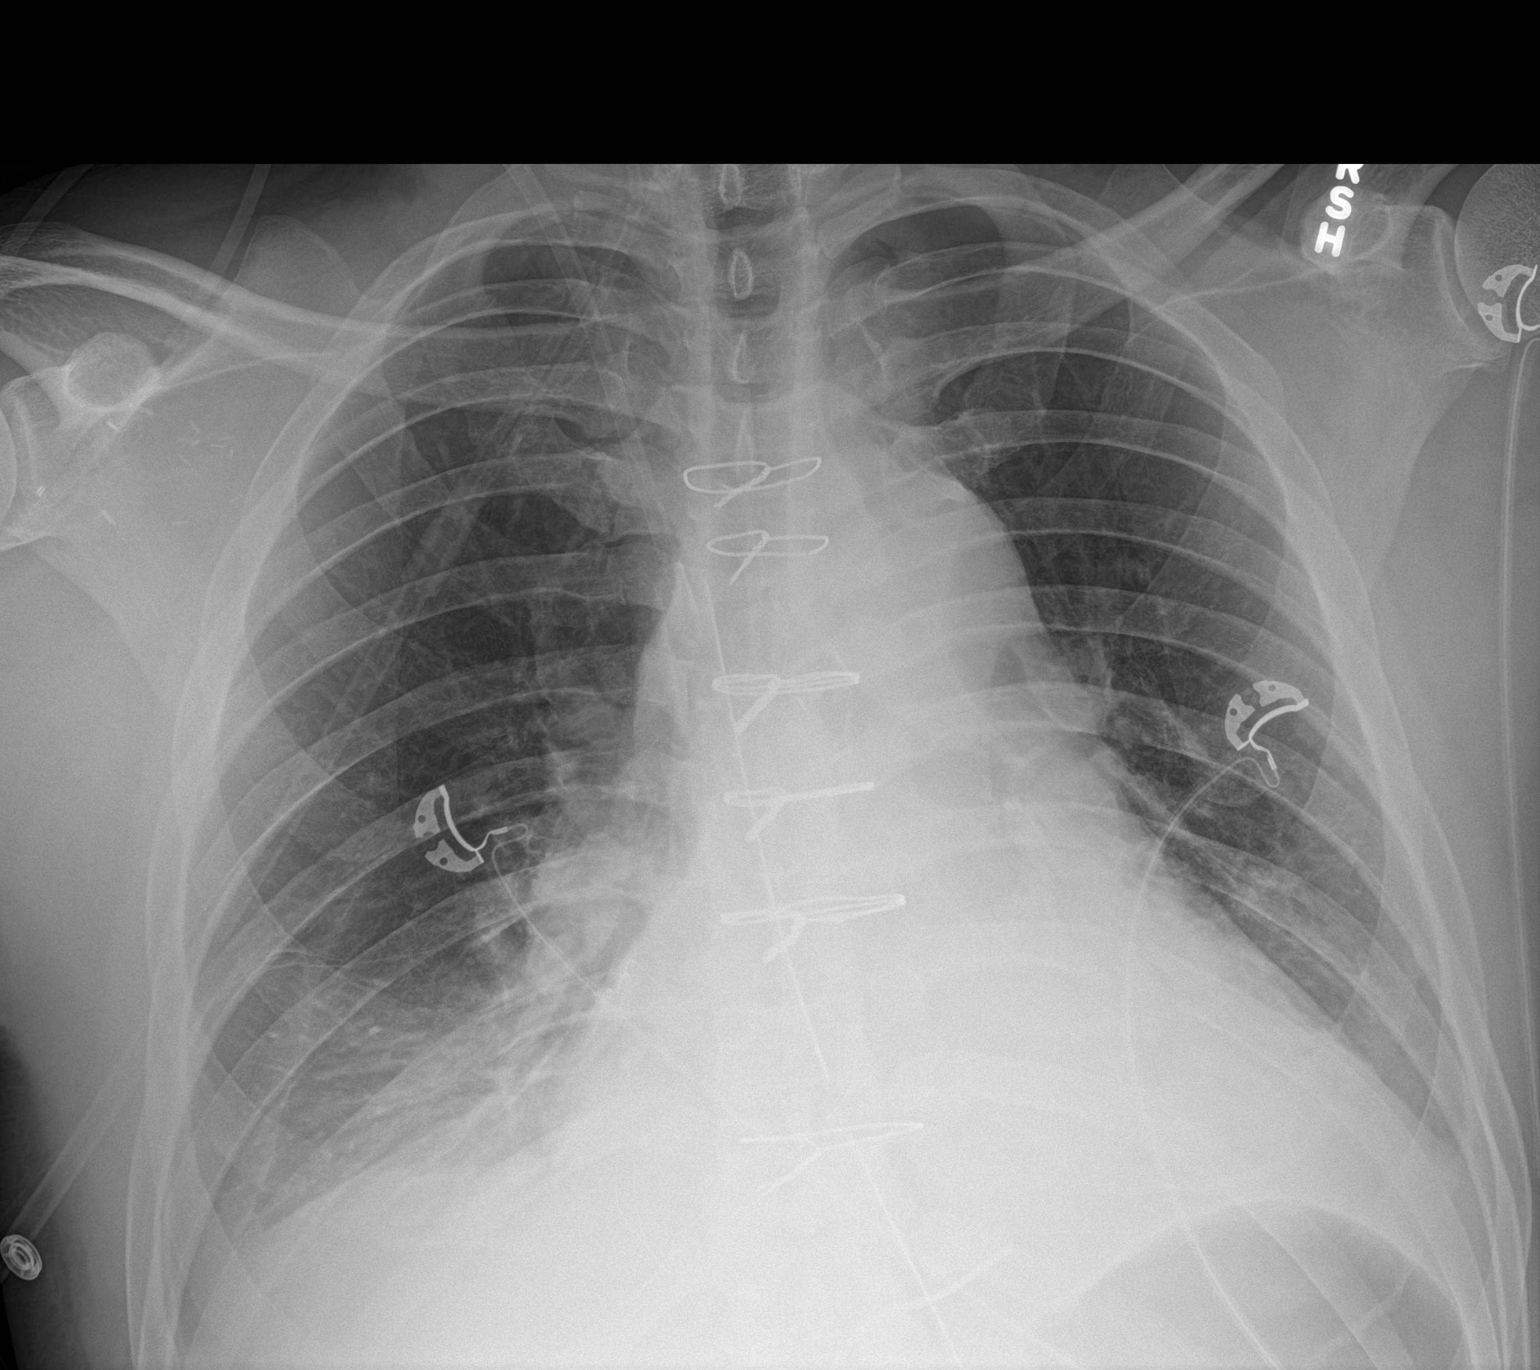

[1 of 1 positions shown; findings below may reference images not displayed]

FINDINGS: Postsurgical changes are again identified and stable. Cardiac shadow
is mildly enlarged but stable. Mediastinal drain remains in place.
Swan-Ganz catheter has been removed. Pericardial drain is again seen
and stable. Lungs demonstrate bibasilar atelectasis slightly
increased when compared with the prior exam. Small effusions may be
present as well although this may be more positional in nature. No
acute bony abnormality is noted.
IMPRESSION: Slight increasing basilar atelectasis with suggestion of small
pleural effusions.

## 2020-04-17 ENCOUNTER — Other Ambulatory Visit (HOSPITAL_BASED_OUTPATIENT_CLINIC_OR_DEPARTMENT_OTHER): Payer: Self-pay | Admitting: Family Medicine

## 2020-04-17 DIAGNOSIS — R109 Unspecified abdominal pain: Secondary | ICD-10-CM

## 2020-04-18 ENCOUNTER — Observation Stay (HOSPITAL_COMMUNITY)
Admission: EM | Admit: 2020-04-18 | Discharge: 2020-04-19 | Disposition: A | Discharge status: Home / Self Care | Payer: BLUE CROSS/BLUE SHIELD | Attending: Internal Medicine | Admitting: Internal Medicine

## 2020-04-18 ENCOUNTER — Observation Stay (HOSPITAL_COMMUNITY): Payer: BLUE CROSS/BLUE SHIELD

## 2020-04-18 ENCOUNTER — Emergency Department (HOSPITAL_COMMUNITY): Payer: BLUE CROSS/BLUE SHIELD

## 2020-04-18 ENCOUNTER — Encounter (HOSPITAL_COMMUNITY): Payer: Self-pay | Admitting: *Deleted

## 2020-04-18 DIAGNOSIS — Z91018 Allergy to other foods: Secondary | ICD-10-CM | POA: Diagnosis not present

## 2020-04-18 DIAGNOSIS — Z833 Family history of diabetes mellitus: Secondary | ICD-10-CM | POA: Diagnosis not present

## 2020-04-18 DIAGNOSIS — G8929 Other chronic pain: Secondary | ICD-10-CM | POA: Insufficient documentation

## 2020-04-18 DIAGNOSIS — F329 Major depressive disorder, single episode, unspecified: Secondary | ICD-10-CM | POA: Insufficient documentation

## 2020-04-18 DIAGNOSIS — I10 Essential (primary) hypertension: Secondary | ICD-10-CM | POA: Diagnosis not present

## 2020-04-18 DIAGNOSIS — I700 Atherosclerosis of aorta: Secondary | ICD-10-CM | POA: Diagnosis not present

## 2020-04-18 DIAGNOSIS — J45909 Unspecified asthma, uncomplicated: Secondary | ICD-10-CM | POA: Insufficient documentation

## 2020-04-18 DIAGNOSIS — N179 Acute kidney failure, unspecified: Principal | ICD-10-CM | POA: Diagnosis present

## 2020-04-18 DIAGNOSIS — Z8249 Family history of ischemic heart disease and other diseases of the circulatory system: Secondary | ICD-10-CM | POA: Diagnosis not present

## 2020-04-18 DIAGNOSIS — E860 Dehydration: Secondary | ICD-10-CM | POA: Insufficient documentation

## 2020-04-18 DIAGNOSIS — E876 Hypokalemia: Secondary | ICD-10-CM | POA: Diagnosis not present

## 2020-04-18 DIAGNOSIS — I252 Old myocardial infarction: Secondary | ICD-10-CM | POA: Insufficient documentation

## 2020-04-18 DIAGNOSIS — Z7982 Long term (current) use of aspirin: Secondary | ICD-10-CM | POA: Diagnosis not present

## 2020-04-18 DIAGNOSIS — Z79899 Other long term (current) drug therapy: Secondary | ICD-10-CM | POA: Diagnosis not present

## 2020-04-18 DIAGNOSIS — Z888 Allergy status to other drugs, medicaments and biological substances status: Secondary | ICD-10-CM | POA: Diagnosis not present

## 2020-04-18 DIAGNOSIS — N3289 Other specified disorders of bladder: Secondary | ICD-10-CM | POA: Insufficient documentation

## 2020-04-18 DIAGNOSIS — Z7952 Long term (current) use of systemic steroids: Secondary | ICD-10-CM | POA: Diagnosis not present

## 2020-04-18 DIAGNOSIS — F17210 Nicotine dependence, cigarettes, uncomplicated: Secondary | ICD-10-CM | POA: Diagnosis not present

## 2020-04-18 LAB — COMPREHENSIVE METABOLIC PANEL
ALT: 68 U/L — ABNORMAL HIGH (ref 0–44)
AST: 41 U/L (ref 15–41)
Albumin: 4.1 g/dL (ref 3.5–5.0)
Alkaline Phosphatase: 73 U/L (ref 38–126)
Anion gap: 12 (ref 5–15)
BUN: 48 mg/dL — ABNORMAL HIGH (ref 6–20)
CO2: 25 mmol/L (ref 22–32)
Calcium: 9.4 mg/dL (ref 8.9–10.3)
Chloride: 100 mmol/L (ref 98–111)
Creatinine, Ser: 2.73 mg/dL — ABNORMAL HIGH (ref 0.61–1.24)
GFR calc Af Amer: 32 mL/min — ABNORMAL LOW (ref >60)
GFR calc non Af Amer: 27 mL/min — ABNORMAL LOW (ref >60)
Glucose, Bld: 144 mg/dL — ABNORMAL HIGH (ref 70–99)
Potassium: 3.3 mmol/L — ABNORMAL LOW (ref 3.5–5.1)
Sodium: 137 mmol/L (ref 135–145)
Total Bilirubin: 0.5 mg/dL (ref 0.3–1.2)
Total Protein: 7.7 g/dL (ref 6.5–8.1)

## 2020-04-18 LAB — CBC
HCT: 56.4 % — ABNORMAL HIGH (ref 39.0–52.0)
Hemoglobin: 19.3 g/dL — ABNORMAL HIGH (ref 13.0–17.0)
MCH: 31.4 pg (ref 26.0–34.0)
MCHC: 34.2 g/dL (ref 30.0–36.0)
MCV: 91.7 fL (ref 80.0–100.0)
Platelets: 328 10*3/uL (ref 150–400)
RBC: 6.15 MIL/uL — ABNORMAL HIGH (ref 4.22–5.81)
RDW: 13.5 % (ref 11.5–15.5)
WBC: 6.6 10*3/uL (ref 4.0–10.5)
nRBC: 0 % (ref 0.0–0.2)

## 2020-04-18 LAB — RAPID URINE DRUG SCREEN, HOSP PERFORMED
Amphetamines: NOT DETECTED
Barbiturates: NOT DETECTED
Benzodiazepines: NOT DETECTED
Cocaine: POSITIVE — AB
Opiates: NOT DETECTED
Tetrahydrocannabinol: POSITIVE — AB

## 2020-04-18 LAB — URINALYSIS, ROUTINE W REFLEX MICROSCOPIC
Bilirubin Urine: NEGATIVE
Glucose, UA: NEGATIVE mg/dL
Hgb urine dipstick: NEGATIVE
Ketones, ur: NEGATIVE mg/dL
Leukocytes,Ua: NEGATIVE
Nitrite: NEGATIVE
Protein, ur: NEGATIVE mg/dL
Specific Gravity, Urine: 1.017 (ref 1.005–1.030)
pH: 5 (ref 5.0–8.0)

## 2020-04-18 LAB — LIPASE, BLOOD: Lipase: 57 U/L — ABNORMAL HIGH (ref 11–51)

## 2020-04-18 LAB — TROPONIN I (HIGH SENSITIVITY)
Troponin I (High Sensitivity): 32 ng/L — ABNORMAL HIGH (ref <18)
Troponin I (High Sensitivity): 29 ng/L — ABNORMAL HIGH (ref <18)
Troponin I (High Sensitivity): 27 ng/L — ABNORMAL HIGH (ref <18)

## 2020-04-18 LAB — CBG MONITORING, ED: Glucose-Capillary: 110 mg/dL — ABNORMAL HIGH (ref 70–99)

## 2020-04-18 MED ORDER — HYDROCHLOROTHIAZIDE 25 MG PO TABS
25.0000 mg | ORAL_TABLET | Freq: Every day | ORAL | Status: DC
  Administered 2020-04-18 – 2020-04-19 (×2): 25 mg via ORAL
  Filled 2020-04-18 (×2): qty 1

## 2020-04-18 MED ORDER — LORAZEPAM 2 MG/ML IJ SOLN
1.0000 mg | Freq: Once | INTRAMUSCULAR | Status: AC
  Administered 2020-04-18: 1 mg via INTRAVENOUS
  Filled 2020-04-18: qty 1

## 2020-04-18 MED ORDER — POTASSIUM CHLORIDE 2 MEQ/ML IV SOLN
INTRAVENOUS | Status: DC
  Filled 2020-04-18 (×2): qty 1000

## 2020-04-18 MED ORDER — POTASSIUM CHLORIDE 10 MEQ/100ML IV SOLN
10.0000 meq | INTRAVENOUS | Status: AC
  Administered 2020-04-18 (×2): 10 meq via INTRAVENOUS
  Filled 2020-04-18 (×2): qty 100

## 2020-04-18 MED ORDER — ACETAMINOPHEN 325 MG PO TABS: 650.0000 mg | ORAL_TABLET | Freq: Four times a day (QID) | ORAL | Status: DC | PRN

## 2020-04-18 MED ORDER — PANTOPRAZOLE SODIUM 40 MG PO TBEC
40.0000 mg | DELAYED_RELEASE_TABLET | Freq: Every day | ORAL | Status: DC
  Administered 2020-04-19: 40 mg via ORAL
  Filled 2020-04-18: qty 1

## 2020-04-18 MED ORDER — LISINOPRIL 40 MG PO TABS
40.0000 mg | ORAL_TABLET | Freq: Every day | ORAL | Status: DC
  Administered 2020-04-18 – 2020-04-19 (×2): 40 mg via ORAL
  Filled 2020-04-18: qty 2
  Filled 2020-04-18: qty 1

## 2020-04-18 MED ORDER — ONDANSETRON HCL 4 MG/2ML IJ SOLN: 4.0000 mg | Freq: Four times a day (QID) | INTRAMUSCULAR | Status: DC | PRN

## 2020-04-18 MED ORDER — SODIUM CHLORIDE 0.9 % IV BOLUS
1000.0000 mL | Freq: Once | INTRAVENOUS | Status: AC
  Administered 2020-04-18: 1000 mL via INTRAVENOUS

## 2020-04-18 MED ORDER — CARVEDILOL 25 MG PO TABS
25.0000 mg | ORAL_TABLET | Freq: Two times a day (BID) | ORAL | Status: DC
  Administered 2020-04-18 – 2020-04-19 (×2): 25 mg via ORAL
  Filled 2020-04-18 (×2): qty 1

## 2020-04-18 MED ORDER — LABETALOL HCL 5 MG/ML IV SOLN: 5.0000 mg | INTRAVENOUS | Status: DC | PRN

## 2020-04-18 MED ORDER — HEPARIN SODIUM (PORCINE) 5000 UNIT/ML IJ SOLN
5000.0000 [IU] | Freq: Three times a day (TID) | INTRAMUSCULAR | Status: DC
  Administered 2020-04-18 – 2020-04-19 (×2): 5000 [IU] via SUBCUTANEOUS
  Filled 2020-04-18 (×3): qty 1

## 2020-04-18 MED ORDER — SUCRALFATE 1 G PO TABS
1.0000 g | ORAL_TABLET | Freq: Four times a day (QID) | ORAL | Status: DC
  Administered 2020-04-18 – 2020-04-19 (×3): 1 g via ORAL
  Filled 2020-04-18 (×3): qty 1

## 2020-04-18 MED ORDER — MAGNESIUM SULFATE IN D5W 1-5 GM/100ML-% IV SOLN
1.0000 g | Freq: Once | INTRAVENOUS | Status: AC
  Administered 2020-04-18: 1 g via INTRAVENOUS
  Filled 2020-04-18: qty 100

## 2020-04-18 MED ORDER — SODIUM CHLORIDE 0.9% FLUSH: 3.0000 mL | Freq: Once | INTRAVENOUS | Status: DC

## 2020-04-18 MED ORDER — CLONIDINE HCL 0.2 MG PO TABS
0.2000 mg | ORAL_TABLET | Freq: Two times a day (BID) | ORAL | Status: DC
  Administered 2020-04-18 – 2020-04-19 (×2): 0.2 mg via ORAL
  Filled 2020-04-18 (×2): qty 1

## 2020-04-18 NOTE — ED Notes (Signed)
RN informed pt that he needs IV fluids that are not compatible with current infusions. Pr declined a second IV.

## 2020-04-18 NOTE — ED Notes (Signed)
Pt unable to tolerate potassium infusion at ordered rate of 100/hr. Rate decreased to 75/hr.

## 2020-04-18 NOTE — ED Triage Notes (Addendum)
To ED for eval of abd pain (epigastric area) and weakness since last week. States he was seen at pcp last week and prescribed breo and mucinex- started feeling weak a day or so after starting. States he stopped taking meds due to feeling dehydrated. Seen again by pcp yesterday and called this am to come to ED due to kidney functions abnormal. Epigastric pain is intermittent now and described as a punch through to back.

## 2020-04-18 NOTE — Progress Notes (Addendum)
Patient c/o painful IV site and request for RN to remove it. NEw IV on left hand. Patient able to completed 2 runs of IV K+, Will start IVF.

## 2020-04-18 NOTE — H&P (Signed)
History and Physical  Stephen West ZOX:[id number] DOB: 04/24/1976 DOA: 04/18/2020  Referring physician: Dr. Allena Katz  PCP: Loletta Specter, PA-C  Outpatient Specialists: None Patient coming from: Home  Chief Complaint: Abdominal pain and diarrhea for 6  HPI: Stephen West is a 44 y.o. male with medical history significant for type A aortic dissection, essential hypertension, previous NSTEMI, asthma, polysubstance abuse including marijuana, tobacco and occasional alcohol use, previous cocaine use quit when diagnosed with aortic dissection a year ago, presented to [facility] ED with complaints of mid quadrant abdominal pain of 6 days duration, nonradiating, associated with intermittent nausea, poor appetite, and moderate watery stools, about 3/day.  No melena or hematochezia.  No vomiting.  Patient went to see his primary care provider yesterday.  Had abnormal lab test results and was advised to present to the ED for further evaluation.  Patient presented today with recurrent abdominal pain which have now resolved.  He denies any fevers or chills.  Has intermittent night sweats.  No changes in his medications.  No one else in the household with similar symptoms.  He denies any chest pain, dyspnea or palpitations.  Symptoms are not similar to his previous aortic dissection.  Presented to [facility] ED for further evaluation and management.  ED Course: Upon presenting to the ED, abdominal pain had resolved however his lab studies where remarkable for elevated troponin S 32, creatinine 2.5 with baseline of 1.2.  EDP requested admission for AKI.  Review of Systems: Review of systems as noted in the HPI. All other systems reviewed and are negative.   Past Medical History:  Diagnosis Date  . Bronchitis   . Hypertension   . NSTEMI (non-ST elevated myocardial infarction) [facility])    Past Surgical History:  Procedure Laterality Date  . BENTALL PROCEDURE N/A 11/24/2018   Procedure: HEMIARCH REPAIR OF TYPE ONE  AORTIC DISSECTION WITH ANTIGRADE CEREBRAL PERFUSSION AND MODERATE HYPOTHERMIC CIRCULATORY ARREST.;  Surgeon: Loreli Slot, MD;  Location: [facility] OR;  Service: Open Heart Surgery;  Laterality: N/A;  . DENTAL SURGERY    . IR THORACENTESIS ASP PLEURAL SPACE W/IMG GUIDE  12/31/2018    Social History:  reports that he has been smoking cigarettes. He has been smoking about 0.50 packs per day. He has never used smokeless tobacco. He reports previous alcohol use of about 5.0 standard drinks of alcohol per week. He reports current drug use. Drug: Marijuana.   Allergies  Allergen Reactions  . Carrot [Daucus Carota] Anaphylaxis    Unknown  . Banana Nausea And Vomiting  . Breo Ellipta [Fluticasone Furoate-Vilanterol] Other (See Comments)    Fatigue, dizziness    Family History  Problem Relation Age of Onset  . Hypertension Mother   . Diabetes Mellitus II Mother       Prior to Admission medications   Medication Sig Start Date End Date Taking? Authorizing Provider  carvedilol (COREG) 25 MG tablet Take 1 tablet (25 mg total) by mouth 2 (two) times daily. 11/30/18  Yes Barrett, Erin R, PA-C  cloNIDine (CATAPRES) 0.2 MG tablet Take 1 tablet (0.2 mg total) by mouth 2 (two) times daily. 12/29/18  Yes Loreli Slot, MD  hydrochlorothiazide (HYDRODIURIL) 25 MG tablet Take 1 tablet (25 mg total) by mouth daily. 07/16/18  Yes Loletta Specter, PA-C  lisinopril (PRINIVIL,ZESTRIL) 40 MG tablet Take 1 tablet (40 mg total) by mouth daily. 12/29/18  Yes Loreli Slot, MD  aspirin EC 325 MG EC tablet Take 1 tablet (325 mg  total) by mouth daily. Patient not taking: Reported on 04/18/2020 11/30/18   Barrett, Rae Roam, PA-C  omeprazole (PRILOSEC) 20 MG capsule Take 20 mg by mouth daily. Newly prescribed 04/17/20   [provider]  predniSONE (STERAPRED UNI-PAK 21 TAB) 10 MG (21) TBPK tablet Take 6 tablets p.o. day 1, 5 on day 2, 4 on day 3, 3 on day 4, 2 on day 5, and 1 on day 6 Patient not  taking: Reported on 03/02/2019 01/19/19   Loreli Slot, MD  sucralfate (CARAFATE) 1 g tablet Take 1 g by mouth 4 (four) times daily. Not taking yet 04/17/20   [provider]    Physical Exam: BP (!) 138/100 (BP Location: Right Arm)   Pulse 72   Temp 97.6 F (36.4 C) (Oral)   Resp 18   Ht 6' (1.829 m)   Wt 99.8 kg   SpO2 98%   BMI 29.84 kg/m   . General: 44 y.o. year-old male well developed well nourished in no acute distress.  Alert and oriented x3. . Cardiovascular: Regular rate and rhythm with no rubs or gallops.  No thyromegaly or JVD noted.  No lower extremity edema. 2/4 pulses in all 4 extremities. Marland Kitchen Respiratory: Clear to auscultation with no wheezes or rales. Good inspiratory effort. . Abdomen: Soft nontender nondistended with normal bowel sounds x4 quadrants. . Muskuloskeletal: No cyanosis, clubbing or edema noted bilaterally . Neuro: CN II-XII intact, strength, sensation, reflexes . Skin: No ulcerative lesions noted or rashes . Psychiatry: Judgement and insight appear normal. Mood is appropriate for condition and setting          Labs on Admission:  Basic Metabolic Panel: Recent Labs  Lab 04/18/20 0904  NA 137  K 3.3*  CL 100  CO2 25  GLUCOSE 144*  BUN 48*  CREATININE 2.73*  CALCIUM 9.4   Liver Function Tests: Recent Labs  Lab 04/18/20 0904  AST 41  ALT 68*  ALKPHOS 73  BILITOT 0.5  PROT 7.7  ALBUMIN 4.1   Recent Labs  Lab 04/18/20 0904  LIPASE 57*   No results for input(s): AMMONIA in the last 168 hours. CBC: Recent Labs  Lab 04/18/20 0904  WBC 6.6  HGB 19.3*  HCT 56.4*  MCV 91.7  PLT 328   Cardiac Enzymes: No results for input(s): CKTOTAL, CKMB, CKMBINDEX, TROPONINI in the last 168 hours.  BNP (last 3 results) No results for input(s): BNP in the last 8760 hours.  ProBNP (last 3 results) No results for input(s): PROBNP in the last 8760 hours.  CBG: No results for input(s): GLUCAP in the last 168  hours.  Radiological Exams on Admission: CT ABDOMEN PELVIS WO CONTRAST  Result Date: 04/18/2020 CLINICAL DATA:  Epigastric pain, chest pain, back pain. Question dissection EXAM: CT CHEST, ABDOMEN AND PELVIS WITHOUT CONTRAST TECHNIQUE: Multidetector CT imaging of the chest, abdomen and pelvis was performed following the standard protocol without IV contrast. COMPARISON:  None. FINDINGS: CT CHEST FINDINGS Cardiovascular: Patient is status post tube graft repair of the ascending thoracic aorta. Normal caliber aorta. Cannot assess for dissection without IV contrast. Heart is normal size. Mediastinum/Nodes: No mediastinal, hilar, or axillary adenopathy. Trachea and esophagus are unremarkable. Thyroid unremarkable. Lungs/Pleura: Lungs are clear. No focal airspace opacities or suspicious nodules. No effusions. Musculoskeletal: Chest wall soft tissues are unremarkable. No acute bony abnormality. Prior median sternotomy. CT ABDOMEN PELVIS FINDINGS Hepatobiliary: No focal hepatic abnormality. Gallbladder unremarkable. Pancreas: No focal abnormality or ductal dilatation. Spleen: No  focal abnormality.  Normal size. Adrenals/Urinary Tract: No adrenal abnormality. No focal renal abnormality. No stones or hydronephrosis. Urinary bladder is unremarkable. Stomach/Bowel: Stomach, large and small bowel grossly unremarkable. Normal appendix. Vascular/Lymphatic: Aortic calcifications. No aneurysm or adenopathy. No visible displaced aortic calcifications to suggest dissection. Difficult to assess for dissection without IV contrast. Reproductive: No visible focal abnormality. Other: No free fluid or free air. Musculoskeletal: No acute bony abnormality. IMPRESSION: Prior aortic repair in the ascending thoracic aorta. No visible displaced calcifications to suggest dissection. Difficult to assess for dissection without intravenous contrast. Infrarenal aortic atherosclerosis. No acute findings in the chest, abdomen or pelvis.  Electronically Signed   By: Charlett NoseKevin  Dover M.D.   On: 04/18/2020 17:09   DG Chest 2 View  Result Date: 04/18/2020 CLINICAL DATA:  Epigastric pain.  Weakness. EXAM: CHEST - 2 VIEW COMPARISON:  04/11/2020 FINDINGS: Post median sternotomy.  Trachea midline. Cardiomediastinal contours are stable.  Hilar structures are normal. Lungs are clear.  No sign of pleural effusion. On limited assessment no acute skeletal process. IMPRESSION: No active cardiopulmonary disease. Electronically Signed   By: Donzetta KohutGeoffrey  Wile M.D.   On: 04/18/2020 09:11   CT Chest Wo Contrast  Result Date: 04/18/2020 CLINICAL DATA:  Epigastric pain, chest pain, back pain. Question dissection EXAM: CT CHEST, ABDOMEN AND PELVIS WITHOUT CONTRAST TECHNIQUE: Multidetector CT imaging of the chest, abdomen and pelvis was performed following the standard protocol without IV contrast. COMPARISON:  None. FINDINGS: CT CHEST FINDINGS Cardiovascular: Patient is status post tube graft repair of the ascending thoracic aorta. Normal caliber aorta. Cannot assess for dissection without IV contrast. Heart is normal size. Mediastinum/Nodes: No mediastinal, hilar, or axillary adenopathy. Trachea and esophagus are unremarkable. Thyroid unremarkable. Lungs/Pleura: Lungs are clear. No focal airspace opacities or suspicious nodules. No effusions. Musculoskeletal: Chest wall soft tissues are unremarkable. No acute bony abnormality. Prior median sternotomy. CT ABDOMEN PELVIS FINDINGS Hepatobiliary: No focal hepatic abnormality. Gallbladder unremarkable. Pancreas: No focal abnormality or ductal dilatation. Spleen: No focal abnormality.  Normal size. Adrenals/Urinary Tract: No adrenal abnormality. No focal renal abnormality. No stones or hydronephrosis. Urinary bladder is unremarkable. Stomach/Bowel: Stomach, large and small bowel grossly unremarkable. Normal appendix. Vascular/Lymphatic: Aortic calcifications. No aneurysm or adenopathy. No visible displaced aortic  calcifications to suggest dissection. Difficult to assess for dissection without IV contrast. Reproductive: No visible focal abnormality. Other: No free fluid or free air. Musculoskeletal: No acute bony abnormality. IMPRESSION: Prior aortic repair in the ascending thoracic aorta. No visible displaced calcifications to suggest dissection. Difficult to assess for dissection without intravenous contrast. Infrarenal aortic atherosclerosis. No acute findings in the chest, abdomen or pelvis. Electronically Signed   By: Charlett NoseKevin  Dover M.D.   On: 04/18/2020 17:09    EKG: I independently viewed the EKG done and my findings are as followed: Normal sinus rhythm with no specific ST-T changes.  Assessment/Plan Present on Admission: . AKI (acute kidney injury) (HCC)  Active Problems:   AKI (acute kidney injury) (HCC)  Abdominal pain, resolved, with associated loose stools, unclear etiology Presented with reported abdominal pain of 6 days duration associated with loose stools, intermittent nausea and poor oral intake Possibly viral related gastroenteritis Abdominal pain resolved at the time of this visit Afebrile with no leukocytosis No recent antibiotic use Continue supportive care  AKI, likely prerenal in the setting of dehydration from poor oral intake and GI losses with frequent watery stools Baseline creatinine appears to be 1.2 with GFR greater than 60 Presented with creatinine of 2.7 with  GFR of 32 No proteinuria or pyuria on UA Obtain renal ultrasound Continue IV fluid hydration Monitor urine output Avoid nephrotoxins and hypotension Repeat BMP in the morning  Elevated troponin suspect demand ischemia Denies any chest pain or dyspnea or palpitations at the time of this visit States his symptoms do not feel like his previous aortic dissection Troponin S peaked at 32 and trended down to 29 No specific ST-T changes on EKG Repeat troponin and twelve-lead EKG Continue to monitor  Hypokalemia,  likely secondary to GI losses with frequent watery stools Presented with serum potassium of 3.3 Replete as indicated Repeat BMP in the morning  Mildly elevated lipase level Recommend to completely abstain from alcohol use No acute findings on CT abdomen and pelvis Symptomatology has resolved Abdominal exam is benign no epigastric pain or tenderness at any quadrant Repeat lipase level is normal  Essential hypertension Maintain BP normotensive in the setting of previous aortic dissection Resume home antihypertensives Monitor vital signs closely  History of thoracic aorta, type A dissection Maintain blood pressure normotensive CT chest no acute findings  History of asthma Stable Resume home medications  Polysubstance abuse disorder Reports use of tobacco 1 pack every other day, marijuana use and occasional alcohol use Drinks 1 to 2 cans of beer every other day Quit cocaine use a year ago after his diagnosis of aortic dissection Obtain UDS Polysubstance abuse cessation counseling at bedside   DVT prophylaxis: Subcu heparin 3 times daily  Code Status: Full code as stated by the patient himself  Family Communication: None at bedside.  Plan discussed with the patient who understands and agrees with the plan.  Disposition Plan: Admit to MedSurg with remote telemetry  Consults called: None  Admission status: Observation status   Status is: Observation    Dispo: The patient is from: Home.               Anticipated d/c is to: Home.               Anticipated d/c date is: 04/19/2020              Patient currently not stable for discharge due to AKI likely prerenal in the setting of dehydration, on IV fluid with ongoing work-up for AKI.      Darlin Drop MD [facility] Pager [phone]  If 7PM-7AM, please contact night-coverage [email or web address] Password [facility]  04/18/2020, 5:51 PM

## 2020-04-18 NOTE — ED Provider Notes (Signed)
[facility] EMERGENCY DEPARTMENT Provider Note   CSN: [id number] Arrival date & time: 04/18/20  0093     History Chief Complaint  Patient presents with  . Weakness  . Chest Pain    Stephen West is a 44 y.o. male with pertinent past medical history of aortic dissection March 2020, polysubstance abuse, hypertensive urgency, asthma that presents to the emergency department today for epigastric pain and weakness since last week.  Patient was seen at PCP a week ago, did see PCP again yesterday for epigastric pain.  Was called by PCP this morning due to increased creatinine.  When speaking to the patient patient states epigastric pain started 5 days ago, is intermittent, and radiates through his back.  Patient states that the pain did initially start in his chest and felt like his previous aortic dissection, has now moved into his epigastrium and does not feel similar to his dissection.  States that the pain feels sharp and knifelike, occurs for about 10 minutes and then disappears and then recurs.  Patient denies any current chest pain, shortness of breath, nausea, vomiting, left arm weakness or paresthesias.  States that he is not currently having epigastric pain.  Patient states that he has not had an appetite for the past 5 days, has been eating 1 meal a day.  Thinks that he is lost a couple pounds over the week.  Also admits to diaphoresis when he does have the pain, no fevers or chills.  Denies any headache, vision changes, gait abnormality.  States that he has been drinking plenty of water, states that he is never had a problem with his creatinine before.  Admits to some alcohol use, states that he drinks about 2 22 ounce beers daily.  Denies any IV drug use.  Denies any NSAID use.  Denies any new medication use besides Mucinex and Breo which he stopped taking.  Denies any triggers to his episodes of epigastric pain, states that food does not trigger the pain or reduce the pain.   Does not have any regurgitation symptoms.  States that upper respiratory infection symptoms have disappeared.  Also admits to one episode of blood in the stool, denies any gross blood or melena.    HPI     Past Medical History:  Diagnosis Date  . Bronchitis   . Hypertension   . NSTEMI (non-ST elevated myocardial infarction) [facility])     Patient Active Problem List   Diagnosis Date Noted  . AKI (acute kidney injury) ([place]) 04/18/2020  . Encounter for medication refill 12/20/2018  . Aortic dissection, thoracic ([place]) 11/24/2018  . Polysubstance abuse ([place]) 04/07/2017  . Tobacco abuse 04/07/2017  . Hypertensive emergency 04/07/2017  . Hypokalemia 04/07/2017  . Hip pain 01/12/2014  . Sinus congestion 01/12/2014  . Asthma 01/06/2014  . HTN (hypertension) 12/30/2013  . Borderline hyperglycemia 12/30/2013  . Chronic generalized abdominal pain 12/30/2013  . Headache 12/30/2013  . Urinary frequency 12/30/2013  . Non-suicidal depressed mood 12/30/2013  . Tobacco abuse counseling 12/30/2013  . Screening for hyperlipidemia 12/30/2013    Past Surgical History:  Procedure Laterality Date  . BENTALL PROCEDURE N/A 11/24/2018   Procedure: HEMIARCH REPAIR OF TYPE ONE AORTIC DISSECTION WITH ANTIGRADE CEREBRAL PERFUSSION AND MODERATE HYPOTHERMIC CIRCULATORY ARREST.;  Surgeon: Melrose Nakayama, MD;  Location: [facility];  Service: Open Heart Surgery;  Laterality: N/A;  . DENTAL SURGERY    . IR THORACENTESIS ASP PLEURAL SPACE W/IMG GUIDE  12/31/2018  Family History  Problem Relation Age of Onset  . Hypertension Mother   . Diabetes Mellitus II Mother     Social History   Tobacco Use  . Smoking status: Current Every Day Smoker    Packs/day: 0.50    Types: Cigarettes  . Smokeless tobacco: Never Used  Vaping Use  . Vaping Use: Former  . Substances: Nicotine, Flavoring  Substance Use Topics  . Alcohol use: Not Currently    Alcohol/week: 5.0 standard drinks    Types: 5 Cans of beer  per week    Comment: occ  . Drug use: Yes    Types: Marijuana    Comment: previous cocaine use    Home Medications Prior to Admission medications   Medication Sig Start Date End Date Taking? Authorizing Provider  carvedilol (COREG) 25 MG tablet Take 1 tablet (25 mg total) by mouth 2 (two) times daily. 11/30/18  Yes Barrett, Erin R, PA-C  cloNIDine (CATAPRES) 0.2 MG tablet Take 1 tablet (0.2 mg total) by mouth 2 (two) times daily. 12/29/18  Yes Melrose Nakayama, MD  hydrochlorothiazide (HYDRODIURIL) 25 MG tablet Take 1 tablet (25 mg total) by mouth daily. 07/16/18  Yes Clent Demark, PA-C  lisinopril (PRINIVIL,ZESTRIL) 40 MG tablet Take 1 tablet (40 mg total) by mouth daily. 12/29/18  Yes Melrose Nakayama, MD  aspirin EC 325 MG EC tablet Take 1 tablet (325 mg total) by mouth daily. Patient not taking: Reported on 04/18/2020 11/30/18   Barrett, Lodema Hong, PA-C  omeprazole (PRILOSEC) 20 MG capsule Take 20 mg by mouth daily. Newly prescribed 04/17/20   [provider]  predniSONE (STERAPRED UNI-PAK 21 TAB) 10 MG (21) TBPK tablet Take 6 tablets p.o. day 1, 5 on day 2, 4 on day 3, 3 on day 4, 2 on day 5, and 1 on day 6 Patient not taking: Reported on 03/02/2019 01/19/19   Melrose Nakayama, MD  sucralfate (CARAFATE) 1 g tablet Take 1 g by mouth 4 (four) times daily. Not taking yet 04/17/20   [provider]    Allergies    Carrot [daucus carota], Banana, and Breo ellipta [fluticasone furoate-vilanterol]  Review of Systems   Review of Systems  Constitutional: Negative for chills, diaphoresis, fatigue and fever.  HENT: Negative for congestion, sore throat and trouble swallowing.   Eyes: Negative for pain and visual disturbance.  Respiratory: Negative for cough, shortness of breath and wheezing.   Cardiovascular: Negative for chest pain, palpitations and leg swelling.  Gastrointestinal: Positive for abdominal pain and blood in stool. Negative for abdominal distention,  diarrhea, nausea and vomiting.  Genitourinary: Negative for difficulty urinating, dysuria, flank pain, hematuria and urgency.  Musculoskeletal: Positive for back pain. Negative for neck pain and neck stiffness.  Skin: Negative for pallor.  Neurological: Negative for dizziness, speech difficulty, weakness and headaches.  Psychiatric/Behavioral: Negative for confusion.    Physical Exam Updated Vital Signs BP (!) 131/96   Pulse 75   Temp 97.6 F (36.4 C) (Oral)   Resp 16   Ht 6' (1.829 m)   Wt 99.8 kg   SpO2 94%   BMI 29.84 kg/m   Physical Exam Constitutional:      General: He is not in acute distress.    Appearance: Normal appearance. He is not ill-appearing, toxic-appearing or diaphoretic.  HENT:     Mouth/Throat:     Mouth: Mucous membranes are moist.     Pharynx: Oropharynx is clear.  Eyes:     General:  No scleral icterus.    Extraocular Movements: Extraocular movements intact.     Pupils: Pupils are equal, round, and reactive to light.  Cardiovascular:     Rate and Rhythm: Normal rate and regular rhythm.     Pulses: Normal pulses.     Heart sounds: Normal heart sounds.  Pulmonary:     Effort: Pulmonary effort is normal. No respiratory distress.     Breath sounds: Normal breath sounds. No stridor. No wheezing, rhonchi or rales.  Chest:     Chest wall: No tenderness.  Abdominal:     General: Abdomen is flat. Bowel sounds are normal. There is no distension.     Palpations: Abdomen is soft. There is no fluid wave, splenomegaly or mass.     Tenderness: There is no abdominal tenderness. There is no guarding or rebound.     Comments: No tenderness when palpation to epigastric region or rest of abdomen.  No peritoneal signs present.  Musculoskeletal:        General: No swelling or tenderness. Normal range of motion.     Cervical back: Normal range of motion and neck supple. No rigidity.     Right lower leg: No edema.     Left lower leg: No edema.  Skin:    General: Skin  is warm and dry.     Capillary Refill: Capillary refill takes less than 2 seconds.     Coloration: Skin is not pale.  Neurological:     General: No focal deficit present.     Mental Status: He is alert and oriented to person, place, and time.     Cranial Nerves: No cranial nerve deficit.     Sensory: Sensation is intact.     Motor: Motor function is intact. No weakness.     Comments: Alert. Clear speech. No facial droop. CNIII-XII grossly intact. Bilateral upper and lower extremities' sensation grossly intact. 5/5 symmetric strength with grip strength and with plantar and dorsi flexion bilaterally. Patellar DTRs are 2+ and symmetric .   Psychiatric:        Mood and Affect: Mood normal.        Behavior: Behavior normal.     ED Results / Procedures / Treatments   Labs (all labs ordered are listed, but only abnormal results are displayed) Labs Reviewed  LIPASE, BLOOD - Abnormal; Notable for the following components:      Result Value   Lipase 57 (*)    All other components within normal limits  COMPREHENSIVE METABOLIC PANEL - Abnormal; Notable for the following components:   Potassium 3.3 (*)    Glucose, Bld 144 (*)    BUN 48 (*)    Creatinine, Ser 2.73 (*)    ALT 68 (*)    GFR calc non Af Amer 27 (*)    GFR calc Af Amer 32 (*)    All other components within normal limits  CBC - Abnormal; Notable for the following components:   RBC 6.15 (*)    Hemoglobin 19.3 (*)    HCT 56.4 (*)    All other components within normal limits  RAPID URINE DRUG SCREEN, HOSP PERFORMED - Abnormal; Notable for the following components:   Cocaine POSITIVE (*)    Tetrahydrocannabinol POSITIVE (*)    All other components within normal limits  CBG MONITORING, ED - Abnormal; Notable for the following components:   Glucose-Capillary 110 (*)    All other components within normal limits  TROPONIN I (HIGH SENSITIVITY) -  Abnormal; Notable for the following components:   Troponin I (High Sensitivity) 32 (*)     All other components within normal limits  TROPONIN I (HIGH SENSITIVITY) - Abnormal; Notable for the following components:   Troponin I (High Sensitivity) 29 (*)    All other components within normal limits  URINALYSIS, ROUTINE W REFLEX MICROSCOPIC  CBC  COMPREHENSIVE METABOLIC PANEL  LIPASE, BLOOD  MAGNESIUM  POC OCCULT BLOOD, ED  TROPONIN I (HIGH SENSITIVITY)    EKG EKG Interpretation  Date/Time:  Tuesday April 18 2020 19:21:37 EDT Ventricular Rate:  77 PR Interval:  156 QRS Duration: 112 QT Interval:  398 QTC Calculation: 451 R Axis:   93 Text Interpretation: Sinus rhythm Probable left atrial enlargement LVH with IVCD and secondary repol abnrm Anterior ST elevation, probably due to LVH No significant change since last tracing since earlier Confirmed by Wandra Arthurs [id number]) on 04/18/2020 7:26:26 PM   Radiology CT ABDOMEN PELVIS WO CONTRAST  Result Date: 04/18/2020 CLINICAL DATA:  Epigastric pain, chest pain, back pain. Question dissection EXAM: CT CHEST, ABDOMEN AND PELVIS WITHOUT CONTRAST TECHNIQUE: Multidetector CT imaging of the chest, abdomen and pelvis was performed following the standard protocol without IV contrast. COMPARISON:  None. FINDINGS: CT CHEST FINDINGS Cardiovascular: Patient is status post tube graft repair of the ascending thoracic aorta. Normal caliber aorta. Cannot assess for dissection without IV contrast. Heart is normal size. Mediastinum/Nodes: No mediastinal, hilar, or axillary adenopathy. Trachea and esophagus are unremarkable. Thyroid unremarkable. Lungs/Pleura: Lungs are clear. No focal airspace opacities or suspicious nodules. No effusions. Musculoskeletal: Chest wall soft tissues are unremarkable. No acute bony abnormality. Prior median sternotomy. CT ABDOMEN PELVIS FINDINGS Hepatobiliary: No focal hepatic abnormality. Gallbladder unremarkable. Pancreas: No focal abnormality or ductal dilatation. Spleen: No focal abnormality.  Normal size.  Adrenals/Urinary Tract: No adrenal abnormality. No focal renal abnormality. No stones or hydronephrosis. Urinary bladder is unremarkable. Stomach/Bowel: Stomach, large and small bowel grossly unremarkable. Normal appendix. Vascular/Lymphatic: Aortic calcifications. No aneurysm or adenopathy. No visible displaced aortic calcifications to suggest dissection. Difficult to assess for dissection without IV contrast. Reproductive: No visible focal abnormality. Other: No free fluid or free air. Musculoskeletal: No acute bony abnormality. IMPRESSION: Prior aortic repair in the ascending thoracic aorta. No visible displaced calcifications to suggest dissection. Difficult to assess for dissection without intravenous contrast. Infrarenal aortic atherosclerosis. No acute findings in the chest, abdomen or pelvis. Electronically Signed   By: Rolm Baptise M.D.   On: 04/18/2020 17:09   DG Chest 2 View  Result Date: 04/18/2020 CLINICAL DATA:  Epigastric pain.  Weakness. EXAM: CHEST - 2 VIEW COMPARISON:  04/11/2020 FINDINGS: Post median sternotomy.  Trachea midline. Cardiomediastinal contours are stable.  Hilar structures are normal. Lungs are clear.  No sign of pleural effusion. On limited assessment no acute skeletal process. IMPRESSION: No active cardiopulmonary disease. Electronically Signed   By: Zetta Bills M.D.   On: 04/18/2020 09:11   CT Chest Wo Contrast  Result Date: 04/18/2020 CLINICAL DATA:  Epigastric pain, chest pain, back pain. Question dissection EXAM: CT CHEST, ABDOMEN AND PELVIS WITHOUT CONTRAST TECHNIQUE: Multidetector CT imaging of the chest, abdomen and pelvis was performed following the standard protocol without IV contrast. COMPARISON:  None. FINDINGS: CT CHEST FINDINGS Cardiovascular: Patient is status post tube graft repair of the ascending thoracic aorta. Normal caliber aorta. Cannot assess for dissection without IV contrast. Heart is normal size. Mediastinum/Nodes: No mediastinal, hilar, or  axillary adenopathy. Trachea and esophagus are unremarkable. Thyroid unremarkable. Lungs/Pleura:  Lungs are clear. No focal airspace opacities or suspicious nodules. No effusions. Musculoskeletal: Chest wall soft tissues are unremarkable. No acute bony abnormality. Prior median sternotomy. CT ABDOMEN PELVIS FINDINGS Hepatobiliary: No focal hepatic abnormality. Gallbladder unremarkable. Pancreas: No focal abnormality or ductal dilatation. Spleen: No focal abnormality.  Normal size. Adrenals/Urinary Tract: No adrenal abnormality. No focal renal abnormality. No stones or hydronephrosis. Urinary bladder is unremarkable. Stomach/Bowel: Stomach, large and small bowel grossly unremarkable. Normal appendix. Vascular/Lymphatic: Aortic calcifications. No aneurysm or adenopathy. No visible displaced aortic calcifications to suggest dissection. Difficult to assess for dissection without IV contrast. Reproductive: No visible focal abnormality. Other: No free fluid or free air. Musculoskeletal: No acute bony abnormality. IMPRESSION: Prior aortic repair in the ascending thoracic aorta. No visible displaced calcifications to suggest dissection. Difficult to assess for dissection without intravenous contrast. Infrarenal aortic atherosclerosis. No acute findings in the chest, abdomen or pelvis. Electronically Signed   By: Rolm Baptise M.D.   On: 04/18/2020 17:09   US RENAL  Result Date: 04/18/2020 CLINICAL DATA:  Acute kidney injury EXAM: RENAL / URINARY TRACT ULTRASOUND COMPLETE COMPARISON:  CT abdomen and pelvis April 18, 2020 FINDINGS: Right Kidney: Renal measurements: 11.5 x 4.4 x 3.5 cm = volume: 91.7 mL . Echogenicity and renal cortical thickness are within normal limits. No mass, perinephric fluid, or hydronephrosis visualized. No sonographically demonstrable calculus or ureterectasis. Left Kidney: Renal measurements: 12.0 x 6.4 x 4.3 cm = volume: 172.2 mL. Echogenicity and renal cortical thickness are within normal limits.  No mass, perinephric fluid, or hydronephrosis visualized. No sonographically demonstrable calculus or ureterectasis. Bladder: Appears normal for degree of bladder distention. Other: None. IMPRESSION: Study within normal limits. Electronically Signed   By: Lowella Grip III M.D.   On: 04/18/2020 19:32    Procedures Procedures (including critical care time)  Medications Ordered in ED Medications  sodium chloride flush (NS) 0.9 % injection 3 mL (has no administration in time range)  carvedilol (COREG) tablet 25 mg (has no administration in time range)  cloNIDine (CATAPRES) tablet 0.2 mg (has no administration in time range)  hydrochlorothiazide (HYDRODIURIL) tablet 25 mg (has no administration in time range)  lisinopril (ZESTRIL) tablet 40 mg (has no administration in time range)  pantoprazole (PROTONIX) EC tablet 40 mg (has no administration in time range)  sucralfate (CARAFATE) tablet 1 g (has no administration in time range)  heparin injection 5,000 Units (has no administration in time range)  acetaminophen (TYLENOL) tablet 650 mg (has no administration in time range)  ondansetron (ZOFRAN) injection 4 mg (has no administration in time range)  labetalol (NORMODYNE) injection 5 mg (has no administration in time range)  lactated ringers 1,000 mL with potassium chloride 20 mEq infusion (has no administration in time range)  magnesium sulfate IVPB 1 g 100 mL (has no administration in time range)  potassium chloride 10 mEq in 100 mL IVPB (has no administration in time range)  sodium chloride 0.9 % bolus 1,000 mL (0 mLs Intravenous Stopped 04/18/20 1639)  LORazepam (ATIVAN) injection 1 mg (1 mg Intravenous Given 04/18/20 1646)  sodium chloride 0.9 % bolus 1,000 mL (1,000 mLs Intravenous New Bag/Given 04/18/20 1739)    ED Course  I have reviewed the triage vital signs and the nursing notes.  Pertinent labs & imaging results that were available during my care of the patient were reviewed by me  and considered in my medical decision making (see chart for details).    MDM Rules/Calculators/A&P  ERYN KREJCI is a 44 y.o. male with pertinent past medical history of aortic dissection March 2020, polysubstance abuse, hypertensive urgency, asthma that presents to the emergency department today for epigastric pain and weakness since last week.Per chart review, labs at [facility] on 6/3 showed creatinine of 1.24.  Patient with creatinine today at 2.73. Patient states that he did have home test kit for blood in his stool, states that was positive when he went to his PCP yesterday I asked patient if we could do Hemoccult testing today, patient did not want this at this time.  I am not able to find previous record of positive Hemoccult from labs yesterday.  Spoke to [facility] radiology about correct scans for patient since patient states that epigastric pain did feel similar to previous dissection with AKI, although does not anymore.  Dissection unlikely.  Bilateral equal pulses, normal neuro exam.  Creatinine  elevated, patient does have AKI at this time.  Will initiate fluids.  Radiologist recommended CT chest abdomen pelvis without contrast at this time.  CT chest abdomen pelvis negative for dissection.  Did speak to Dr. Darl Householder who does not think that we need MRI at this time.  Patient to be admitted for dehydration and AKI.  Patient agreeable for this.  CBC and CMP stable except for elevated creatinine and BUN, this is most likely prerenal.  Hemoglobin stable, no leukocytosis.  Lipase 57, this is unlikely to pancreatitis at this time.    8:09 PM discussed case with hospitalist, Dr. Nevada Crane, who agrees to accept care of patient.  The patient appears reasonably stabilized for admission considering the current resources, flow, and capabilities available in the ED at this time, and I doubt any other [facility] requiring further screening and/or treatment in the ED prior to admission.  I  discussed this case with my attending physician who cosigned this note including patient's presenting symptoms, physical exam, and planned diagnostics and interventions. Attending physician stated agreement with plan or made changes to plan which were implemented.   Attending physician assessed patient at bedside.   Final Clinical Impression(s) / ED Diagnoses Final diagnoses:  AKI (acute kidney injury) [facility])  Dehydration    Rx / DC Orders ED Discharge Orders    None       Alfredia Client, PA-C 04/18/20 2009    Drenda Freeze, MD 04/18/20 2329

## 2020-04-18 NOTE — ED Notes (Signed)
Admitting returned RN page in regards to telemetry orders. Admitting stated telemetry orders are not needed at this time.

## 2020-04-18 NOTE — ED Notes (Signed)
RN paged admitting to request pt be a telemetry admit due to pt receiving IV potassium.

## 2020-04-19 DIAGNOSIS — N179 Acute kidney failure, unspecified: Secondary | ICD-10-CM

## 2020-04-19 DIAGNOSIS — I10 Essential (primary) hypertension: Secondary | ICD-10-CM | POA: Diagnosis not present

## 2020-04-19 LAB — COMPREHENSIVE METABOLIC PANEL
ALT: 57 U/L — ABNORMAL HIGH (ref 0–44)
AST: 33 U/L (ref 15–41)
Albumin: 3.4 g/dL — ABNORMAL LOW (ref 3.5–5.0)
Alkaline Phosphatase: 60 U/L (ref 38–126)
Anion gap: 7 (ref 5–15)
BUN: 28 mg/dL — ABNORMAL HIGH (ref 6–20)
CO2: 27 mmol/L (ref 22–32)
Calcium: 9.1 mg/dL (ref 8.9–10.3)
Chloride: 105 mmol/L (ref 98–111)
Creatinine, Ser: 1.75 mg/dL — ABNORMAL HIGH (ref 0.61–1.24)
GFR calc Af Amer: 54 mL/min — ABNORMAL LOW (ref >60)
GFR calc non Af Amer: 47 mL/min — ABNORMAL LOW (ref >60)
Glucose, Bld: 104 mg/dL — ABNORMAL HIGH (ref 70–99)
Potassium: 3.6 mmol/L (ref 3.5–5.1)
Sodium: 139 mmol/L (ref 135–145)
Total Bilirubin: 0.8 mg/dL (ref 0.3–1.2)
Total Protein: 6.1 g/dL — ABNORMAL LOW (ref 6.5–8.1)

## 2020-04-19 LAB — LIPASE, BLOOD: Lipase: 66 U/L — ABNORMAL HIGH (ref 11–51)

## 2020-04-19 LAB — BASIC METABOLIC PANEL
Anion gap: 6 (ref 5–15)
BUN: 24 mg/dL — ABNORMAL HIGH (ref 6–20)
CO2: 30 mmol/L (ref 22–32)
Calcium: 9.5 mg/dL (ref 8.9–10.3)
Chloride: 102 mmol/L (ref 98–111)
Creatinine, Ser: 1.66 mg/dL — ABNORMAL HIGH (ref 0.61–1.24)
GFR calc Af Amer: 58 mL/min — ABNORMAL LOW (ref >60)
GFR calc non Af Amer: 50 mL/min — ABNORMAL LOW (ref >60)
Glucose, Bld: 88 mg/dL (ref 70–99)
Potassium: 3.4 mmol/L — ABNORMAL LOW (ref 3.5–5.1)
Sodium: 138 mmol/L (ref 135–145)

## 2020-04-19 LAB — CBC
HCT: 51.4 % (ref 39.0–52.0)
Hemoglobin: 17.6 g/dL — ABNORMAL HIGH (ref 13.0–17.0)
MCH: 31.4 pg (ref 26.0–34.0)
MCHC: 34.2 g/dL (ref 30.0–36.0)
MCV: 91.8 fL (ref 80.0–100.0)
Platelets: 257 10*3/uL (ref 150–400)
RBC: 5.6 MIL/uL (ref 4.22–5.81)
RDW: 13.2 % (ref 11.5–15.5)
WBC: 4.6 10*3/uL (ref 4.0–10.5)
nRBC: 0 % (ref 0.0–0.2)

## 2020-04-19 LAB — TROPONIN I (HIGH SENSITIVITY): Troponin I (High Sensitivity): 29 ng/L — ABNORMAL HIGH (ref <18)

## 2020-04-19 LAB — MAGNESIUM: Magnesium: 2.3 mg/dL (ref 1.7–2.4)

## 2020-04-19 MED ORDER — AMLODIPINE BESYLATE 5 MG PO TABS
5.0000 mg | ORAL_TABLET | Freq: Every day | ORAL | 0 refills | Status: DC
Start: 2020-04-19 — End: 2021-01-21

## 2020-04-19 MED FILL — AMLODIPINE BESYLATE 5 MG TA: 5 | 30 days supply | Qty: 30 | Fill #0

## 2020-04-19 NOTE — Discharge Summary (Signed)
Physician Discharge Summary  Stephen West:[id number] DOB: October 24, 1975 DOA: 04/18/2020  PCP: Loletta Specter, PA-C  Admit date: 04/18/2020 Discharge date: 04/19/2020  Admitted From: Home Discharge disposition: Home   Recommendations for Outpatient Follow-Up:   1. BMP 1 week 2. HCTZ and lisinopril held until creatinine recovered 3. Norvasc started in its place for now for better blood pressure control 4. Encouraged smoking cessation and cocaine cessation   Discharge Diagnosis:   Active Problems:   AKI (acute kidney injury) (HCC)    Discharge Condition: Improved.  Diet recommendation: Low sodium, heart healthy  Wound care: None.  Code status: Full.   History of Present Illness:   Stephen West is a 44 y.o. male with medical history significant for type A aortic dissection, essential hypertension, previous NSTEMI, asthma, polysubstance abuse including marijuana, tobacco and occasional alcohol use, previous cocaine use quit when diagnosed with aortic dissection a year ago, presented to [facility] ED with complaints of mid quadrant abdominal pain of 6 days duration, nonradiating, associated with intermittent nausea, poor appetite, and moderate watery stools, about 3/day.  No melena or hematochezia.  No vomiting.  Patient went to see his primary care provider yesterday.  Had abnormal lab test results and was advised to present to the ED for further evaluation.  Patient presented today with recurrent abdominal pain which have now resolved.  He denies any fevers or chills.  Has intermittent night sweats.  No changes in his medications.  No one else in the household with similar symptoms.  He denies any chest pain, dyspnea or palpitations.  Symptoms are not similar to his previous aortic dissection.  Presented to [facility] ED for further evaluation and management.    Hospital Course by Problem:   Abdominal pain, resolved, with associated loose stools, unclear  etiology -resolved  AKI, likely prerenal in the setting of dehydration from poor oral intake and GI losses with frequent watery stools and BP meds Baseline creatinine appears to be 1.2 with GFR greater than 60 Presented with creatinine of 2.7 with GFR of 32 No proteinuria or pyuria on UA renal ultrasound normal -Cr trending down-competent patient can maintain adequate hydration orally patient is able to eat now -Will need close outpatient follow-up  Hypokalemia, likely secondary to GI losses with frequent watery stools repleted  Mildly elevated lipase level Recommend to completely abstain from alcohol use  Essential hypertension -adjust meds to avoid HCTZ/lisinopril until kidneys better  History of thoracic aorta, type A dissection Maintain blood pressure normotensive CT chest no acute findings  History of asthma Stable Resume home medications  Polysubstance abuse disorder Reports use of tobacco 1 pack every other day, marijuana use and occasional alcohol use Drinks 1 to 2 cans of beer every other day Encourage cessation    Medical Consultants:      Discharge Exam:   Vitals:   04/19/20 0631 04/19/20 1251  BP: (!) 120/92 (!) 143/95  Pulse: 68 (!) 59  Resp: 18 18  Temp: 97.6 F (36.4 C) 98.1 F (36.7 C)  SpO2: 97% 100%   Vitals:   04/18/20 2248 04/19/20 0503 04/19/20 0631 04/19/20 1251  BP: (!) 149/111  (!) 120/92 (!) 143/95  Pulse: 67  68 (!) 59  Resp: 18  18 18   Temp: 98 F (36.7 C)  97.6 F (36.4 C) 98.1 F (36.7 C)  TempSrc: Oral  Oral Oral  SpO2: 96%  97% 100%  Weight:  96.5 kg    Height:  General exam: Appears calm and comfortable.  Up walking the unit  The results of significant diagnostics from this hospitalization (including imaging, microbiology, ancillary and laboratory) are listed below for reference.     Procedures and Diagnostic Studies:   CT ABDOMEN PELVIS WO CONTRAST  Result Date: 04/18/2020 CLINICAL DATA:   Epigastric pain, chest pain, back pain. Question dissection EXAM: CT CHEST, ABDOMEN AND PELVIS WITHOUT CONTRAST TECHNIQUE: Multidetector CT imaging of the chest, abdomen and pelvis was performed following the standard protocol without IV contrast. COMPARISON:  None. FINDINGS: CT CHEST FINDINGS Cardiovascular: Patient is status post tube graft repair of the ascending thoracic aorta. Normal caliber aorta. Cannot assess for dissection without IV contrast. Heart is normal size. Mediastinum/Nodes: No mediastinal, hilar, or axillary adenopathy. Trachea and esophagus are unremarkable. Thyroid unremarkable. Lungs/Pleura: Lungs are clear. No focal airspace opacities or suspicious nodules. No effusions. Musculoskeletal: Chest wall soft tissues are unremarkable. No acute bony abnormality. Prior median sternotomy. CT ABDOMEN PELVIS FINDINGS Hepatobiliary: No focal hepatic abnormality. Gallbladder unremarkable. Pancreas: No focal abnormality or ductal dilatation. Spleen: No focal abnormality.  Normal size. Adrenals/Urinary Tract: No adrenal abnormality. No focal renal abnormality. No stones or hydronephrosis. Urinary bladder is unremarkable. Stomach/Bowel: Stomach, large and small bowel grossly unremarkable. Normal appendix. Vascular/Lymphatic: Aortic calcifications. No aneurysm or adenopathy. No visible displaced aortic calcifications to suggest dissection. Difficult to assess for dissection without IV contrast. Reproductive: No visible focal abnormality. Other: No free fluid or free air. Musculoskeletal: No acute bony abnormality. IMPRESSION: Prior aortic repair in the ascending thoracic aorta. No visible displaced calcifications to suggest dissection. Difficult to assess for dissection without intravenous contrast. Infrarenal aortic atherosclerosis. No acute findings in the chest, abdomen or pelvis. Electronically Signed   By: Charlett NoseKevin  Dover M.D.   On: 04/18/2020 17:09   DG Chest 2 View  Result Date: 04/18/2020 CLINICAL  DATA:  Epigastric pain.  Weakness. EXAM: CHEST - 2 VIEW COMPARISON:  04/11/2020 FINDINGS: Post median sternotomy.  Trachea midline. Cardiomediastinal contours are stable.  Hilar structures are normal. Lungs are clear.  No sign of pleural effusion. On limited assessment no acute skeletal process. IMPRESSION: No active cardiopulmonary disease. Electronically Signed   By: Donzetta KohutGeoffrey  Wile M.D.   On: 04/18/2020 09:11   CT Chest Wo Contrast  Result Date: 04/18/2020 CLINICAL DATA:  Epigastric pain, chest pain, back pain. Question dissection EXAM: CT CHEST, ABDOMEN AND PELVIS WITHOUT CONTRAST TECHNIQUE: Multidetector CT imaging of the chest, abdomen and pelvis was performed following the standard protocol without IV contrast. COMPARISON:  None. FINDINGS: CT CHEST FINDINGS Cardiovascular: Patient is status post tube graft repair of the ascending thoracic aorta. Normal caliber aorta. Cannot assess for dissection without IV contrast. Heart is normal size. Mediastinum/Nodes: No mediastinal, hilar, or axillary adenopathy. Trachea and esophagus are unremarkable. Thyroid unremarkable. Lungs/Pleura: Lungs are clear. No focal airspace opacities or suspicious nodules. No effusions. Musculoskeletal: Chest wall soft tissues are unremarkable. No acute bony abnormality. Prior median sternotomy. CT ABDOMEN PELVIS FINDINGS Hepatobiliary: No focal hepatic abnormality. Gallbladder unremarkable. Pancreas: No focal abnormality or ductal dilatation. Spleen: No focal abnormality.  Normal size. Adrenals/Urinary Tract: No adrenal abnormality. No focal renal abnormality. No stones or hydronephrosis. Urinary bladder is unremarkable. Stomach/Bowel: Stomach, large and small bowel grossly unremarkable. Normal appendix. Vascular/Lymphatic: Aortic calcifications. No aneurysm or adenopathy. No visible displaced aortic calcifications to suggest dissection. Difficult to assess for dissection without IV contrast. Reproductive: No visible focal  abnormality. Other: No free fluid or free air. Musculoskeletal: No acute bony abnormality. IMPRESSION:  Prior aortic repair in the ascending thoracic aorta. No visible displaced calcifications to suggest dissection. Difficult to assess for dissection without intravenous contrast. Infrarenal aortic atherosclerosis. No acute findings in the chest, abdomen or pelvis. Electronically Signed   By: Charlett Nose M.D.   On: 04/18/2020 17:09   US RENAL  Result Date: 04/18/2020 CLINICAL DATA:  Acute kidney injury EXAM: RENAL / URINARY TRACT ULTRASOUND COMPLETE COMPARISON:  CT abdomen and pelvis April 18, 2020 FINDINGS: Right Kidney: Renal measurements: 11.5 x 4.4 x 3.5 cm = volume: 91.7 mL . Echogenicity and renal cortical thickness are within normal limits. No mass, perinephric fluid, or hydronephrosis visualized. No sonographically demonstrable calculus or ureterectasis. Left Kidney: Renal measurements: 12.0 x 6.4 x 4.3 cm = volume: 172.2 mL. Echogenicity and renal cortical thickness are within normal limits. No mass, perinephric fluid, or hydronephrosis visualized. No sonographically demonstrable calculus or ureterectasis. Bladder: Appears normal for degree of bladder distention. Other: None. IMPRESSION: Study within normal limits. Electronically Signed   By: Bretta Bang III M.D.   On: 04/18/2020 19:32     Labs:   Basic Metabolic Panel: Recent Labs  Lab 04/18/20 0904 04/18/20 0904 04/19/20 0708 04/19/20 1455  NA 137  --  139 138  K 3.3*   < > 3.6 3.4*  CL 100  --  105 102  CO2 25  --  27 30  GLUCOSE 144*  --  104* 88  BUN 48*  --  28* 24*  CREATININE 2.73*  --  1.75* 1.66*  CALCIUM 9.4  --  9.1 9.5  MG  --   --  2.3  --    < > = values in this interval not displayed.   GFR Estimated Creatinine Clearance: 69.1 mL/min (A) (by C-G formula based on SCr of 1.66 mg/dL (H)). Liver Function Tests: Recent Labs  Lab 04/18/20 0904 04/19/20 0708  AST 41 33  ALT 68* 57*  ALKPHOS 73 60  BILITOT  0.5 0.8  PROT 7.7 6.1*  ALBUMIN 4.1 3.4*   Recent Labs  Lab 04/18/20 0904 04/19/20 0708  LIPASE 57* 66*   No results for input(s): AMMONIA in the last 168 hours. Coagulation profile No results for input(s): INR, PROTIME in the last 168 hours.  CBC: Recent Labs  Lab 04/18/20 0904 04/19/20 0708  WBC 6.6 4.6  HGB 19.3* 17.6*  HCT 56.4* 51.4  MCV 91.7 91.8  PLT 328 257   Cardiac Enzymes: No results for input(s): CKTOTAL, CKMB, CKMBINDEX, TROPONINI in the last 168 hours. BNP: Invalid input(s): POCBNP CBG: Recent Labs  Lab 04/18/20 1932  GLUCAP 110*   D-Dimer No results for input(s): DDIMER in the last 72 hours. Hgb A1c No results for input(s): HGBA1C in the last 72 hours. Lipid Profile No results for input(s): CHOL, HDL, LDLCALC, TRIG, CHOLHDL, LDLDIRECT in the last 72 hours. Thyroid function studies No results for input(s): TSH, T4TOTAL, T3FREE, THYROIDAB in the last 72 hours.  Invalid input(s): FREET3 Anemia work up No results for input(s): VITAMINB12, FOLATE, FERRITIN, TIBC, IRON, RETICCTPCT in the last 72 hours. Microbiology No results found for this or any previous visit (from the past 240 hour(s)).   Discharge Instructions:   Discharge Instructions    Diet - low sodium heart healthy   Complete by: As directed    Discharge instructions   Complete by: As directed    Continue to drink plenty of fluid I have held your HCTZ and lisinopril until you kidney function is back to  normal I have started norvasc in their place-- can start with 5 mg and if blood pressure not controlled increase to 10mg  daily   Increase activity slowly   Complete by: As directed      Allergies as of 04/19/2020      Reactions   Carrot [daucus [place]] Anaphylaxis   Unknown   Banana Nausea And Vomiting   Breo Ellipta [fluticasone Furoate-vilanterol] Other (See Comments)   Fatigue, dizziness      Medication List    STOP taking these medications   aspirin 325 MG EC tablet    hydrochlorothiazide 25 MG tablet Commonly known as: HYDRODIURIL   lisinopril 40 MG tablet Commonly known as: ZESTRIL   predniSONE 10 MG (21) Tbpk tablet Commonly known as: STERAPRED UNI-PAK 21 TAB     TAKE these medications   amLODipine 5 MG tablet Commonly known as: Norvasc Take 1 tablet (5 mg total) by mouth daily.   carvedilol 25 MG tablet Commonly known as: COREG Take 1 tablet (25 mg total) by mouth 2 (two) times daily.   cloNIDine 0.2 MG tablet Commonly known as: CATAPRES Take 1 tablet (0.2 mg total) by mouth 2 (two) times daily.   omeprazole 20 MG capsule Commonly known as: PRILOSEC Take 20 mg by mouth daily. Newly prescribed   sucralfate 1 g tablet Commonly known as: CARAFATE Take 1 g by mouth 4 (four) times daily. Not taking yet         Time coordinating discharge: 35 min  Signed:  04/21/2020 DO  [facility] 04/19/2020, 4:57 PM

## 2020-04-20 ENCOUNTER — Telehealth: Payer: Self-pay

## 2020-04-20 LAB — HEMOGLOBIN A1C
Hgb A1c MFr Bld: 5.4 % (ref 4.8–5.6)
Mean Plasma Glucose: 108 mg/dL

## 2020-04-20 NOTE — Telephone Encounter (Signed)
Transition Care Management Follow-up Telephone Call Date of discharge and from where: 04/19/2020, [facility] .  Call placed to patient # [phone] to discuss post hospital follow up.  He has been seen at [facility] in the past and PCP listed as former [facility] provider.  Need to confirm if he has established care with another PCP Message left with call back requested to this CM

## 2020-04-21 ENCOUNTER — Telehealth: Payer: Self-pay

## 2020-04-21 NOTE — Telephone Encounter (Signed)
Transition Care Management Follow-up Telephone Call Attempt #2  Date of discharge and from where: 04/19/2020, [facility] .  Call placed to patient # [phone] to discuss post hospital follow up.  He has been seen at [facility] in the past and PCP listed as former [facility] provider.  Need to confirm if he has established care with another PCP Message left with call back requested to this CM

## 2020-04-27 ENCOUNTER — Telehealth: Payer: Self-pay

## 2020-04-27 NOTE — Telephone Encounter (Signed)
Letter sent to patient informing him that we have not been able to reach him to schedule a hospital follow up appointment. requesting he call this office to schedule an appointment or update his records if he has changed PCP.

## 2021-01-18 ENCOUNTER — Emergency Department (HOSPITAL_COMMUNITY): Payer: 59

## 2021-01-18 ENCOUNTER — Observation Stay (HOSPITAL_COMMUNITY)
Admission: EM | Admit: 2021-01-18 | Discharge: 2021-01-21 | Disposition: A | Discharge status: Home / Self Care | Payer: 59 | Attending: Internal Medicine | Admitting: Internal Medicine

## 2021-01-18 ENCOUNTER — Encounter (HOSPITAL_COMMUNITY): Payer: Self-pay | Admitting: Emergency Medicine

## 2021-01-18 ENCOUNTER — Other Ambulatory Visit: Payer: Self-pay

## 2021-01-18 DIAGNOSIS — Z79899 Other long term (current) drug therapy: Secondary | ICD-10-CM | POA: Diagnosis not present

## 2021-01-18 DIAGNOSIS — F1910 Other psychoactive substance abuse, uncomplicated: Secondary | ICD-10-CM | POA: Diagnosis present

## 2021-01-18 DIAGNOSIS — R519 Headache, unspecified: Secondary | ICD-10-CM | POA: Diagnosis present

## 2021-01-18 DIAGNOSIS — J45909 Unspecified asthma, uncomplicated: Secondary | ICD-10-CM | POA: Diagnosis not present

## 2021-01-18 DIAGNOSIS — I10 Essential (primary) hypertension: Secondary | ICD-10-CM

## 2021-01-18 DIAGNOSIS — Z20822 Contact with and (suspected) exposure to covid-19: Secondary | ICD-10-CM | POA: Insufficient documentation

## 2021-01-18 DIAGNOSIS — F17210 Nicotine dependence, cigarettes, uncomplicated: Secondary | ICD-10-CM | POA: Insufficient documentation

## 2021-01-18 DIAGNOSIS — I160 Hypertensive urgency: Principal | ICD-10-CM

## 2021-01-18 DIAGNOSIS — R202 Paresthesia of skin: Secondary | ICD-10-CM

## 2021-01-18 DIAGNOSIS — M25559 Pain in unspecified hip: Secondary | ICD-10-CM

## 2021-01-18 DIAGNOSIS — M545 Low back pain, unspecified: Secondary | ICD-10-CM | POA: Insufficient documentation

## 2021-01-18 LAB — CBC
HCT: 52 % (ref 39.0–52.0)
Hemoglobin: 17.7 g/dL — ABNORMAL HIGH (ref 13.0–17.0)
MCH: 31.4 pg (ref 26.0–34.0)
MCHC: 34 g/dL (ref 30.0–36.0)
MCV: 92.2 fL (ref 80.0–100.0)
Platelets: 287 10*3/uL (ref 150–400)
RBC: 5.64 MIL/uL (ref 4.22–5.81)
RDW: 14 % (ref 11.5–15.5)
WBC: 6.1 10*3/uL (ref 4.0–10.5)
nRBC: 0 % (ref 0.0–0.2)

## 2021-01-18 LAB — I-STAT CHEM 8, ED
BUN: 12 mg/dL (ref 6–20)
Calcium, Ion: 1.16 mmol/L (ref 1.15–1.40)
Chloride: 102 mmol/L (ref 98–111)
Creatinine, Ser: 1.1 mg/dL (ref 0.61–1.24)
Glucose, Bld: 99 mg/dL (ref 70–99)
HCT: 53 % — ABNORMAL HIGH (ref 39.0–52.0)
Hemoglobin: 18 g/dL — ABNORMAL HIGH (ref 13.0–17.0)
Potassium: 3.4 mmol/L — ABNORMAL LOW (ref 3.5–5.1)
Sodium: 140 mmol/L (ref 135–145)
TCO2: 27 mmol/L (ref 22–32)

## 2021-01-18 LAB — DIFFERENTIAL
Abs Immature Granulocytes: 0.02 10*3/uL (ref 0.00–0.07)
Basophils Absolute: 0 10*3/uL (ref 0.0–0.1)
Basophils Relative: 1 %
Eosinophils Absolute: 0.6 10*3/uL — ABNORMAL HIGH (ref 0.0–0.5)
Eosinophils Relative: 10 %
Immature Granulocytes: 0 %
Lymphocytes Relative: 39 %
Lymphs Abs: 2.4 10*3/uL (ref 0.7–4.0)
Monocytes Absolute: 0.5 10*3/uL (ref 0.1–1.0)
Monocytes Relative: 8 %
Neutro Abs: 2.5 10*3/uL (ref 1.7–7.7)
Neutrophils Relative %: 42 %

## 2021-01-18 LAB — COMPREHENSIVE METABOLIC PANEL
ALT: 15 U/L (ref 0–44)
AST: 21 U/L (ref 15–41)
Albumin: 3.8 g/dL (ref 3.5–5.0)
Alkaline Phosphatase: 70 U/L (ref 38–126)
Anion gap: 8 (ref 5–15)
BUN: 10 mg/dL (ref 6–20)
CO2: 26 mmol/L (ref 22–32)
Calcium: 9.4 mg/dL (ref 8.9–10.3)
Chloride: 102 mmol/L (ref 98–111)
Creatinine, Ser: 1.11 mg/dL (ref 0.61–1.24)
GFR, Estimated: >60 mL/min (ref >60)
Glucose, Bld: 108 mg/dL — ABNORMAL HIGH (ref 70–99)
Potassium: 3.5 mmol/L (ref 3.5–5.1)
Sodium: 136 mmol/L (ref 135–145)
Total Bilirubin: 0.7 mg/dL (ref 0.3–1.2)
Total Protein: 6.9 g/dL (ref 6.5–8.1)

## 2021-01-18 LAB — PROTIME-INR
INR: 1 (ref 0.8–1.2)
Prothrombin Time: 12.4 seconds (ref 11.4–15.2)

## 2021-01-18 LAB — APTT: aPTT: 41 seconds — ABNORMAL HIGH (ref 24–36)

## 2021-01-18 MED ORDER — OXYCODONE-ACETAMINOPHEN 5-325 MG PO TABS
1.0000 | ORAL_TABLET | Freq: Once | ORAL | Status: AC
  Administered 2021-01-18: 1 via ORAL
  Filled 2021-01-18: qty 1

## 2021-01-18 MED ORDER — CLONIDINE HCL 0.2 MG PO TABS
0.2000 mg | ORAL_TABLET | Freq: Once | ORAL | Status: AC
  Administered 2021-01-18: 0.2 mg via ORAL
  Filled 2021-01-18: qty 1

## 2021-01-18 MED ORDER — SODIUM CHLORIDE 0.9% FLUSH
3.0000 mL | Freq: Once | INTRAVENOUS | Status: AC
  Administered 2021-01-19: 3 mL via INTRAVENOUS

## 2021-01-18 MED ORDER — CARVEDILOL 3.125 MG PO TABS
25.0000 mg | ORAL_TABLET | Freq: Once | ORAL | Status: AC
  Administered 2021-01-18: 25 mg via ORAL
  Filled 2021-01-18: qty 8

## 2021-01-18 NOTE — ED Provider Notes (Signed)
[facility] EMERGENCY DEPARTMENT Provider Note   CSN: [id number] Arrival date & time: 01/18/21  2131     History Chief Complaint  Patient presents with  . Headache    Stephen West is a 45 y.o. male.  Patient with hx uncontrolled htn, presents with high bp, and states around 7 pm noted tingling to tips of fingers of left hand and left toes. Symptoms acute onset, moderate, constant, persistent, but now improving. Denies any weakness or loss of normal function. No change in speech or vision. No problems w balance, coordination or gait. Mild-mod dull diffuse headache onset earlier this evening. No acute, abrupt, or severe head pain. No neck pain or stiffness. States compliant w bp meds, but has not taken his evening dose of his meds. Has adequate meds at home. Also c/o low back pain, dull, occasionally radiating to right buttock and upper leg. Denies injury, fall or strain. No RLE numbness or weakness. No hx ddd. No fever or chills.   The history is provided by the patient.  Headache Associated symptoms: back pain   Associated symptoms: no abdominal pain, no congestion, no cough, no eye pain, no fever, no neck pain, no sinus pressure, no sore throat, no vomiting and no weakness        Past Medical History:  Diagnosis Date  . Bronchitis   . Hypertension   . NSTEMI (non-ST elevated myocardial infarction) [facility])     Patient Active Problem List   Diagnosis Date Noted  . AKI (acute kidney injury) (HCC) 04/18/2020  . Encounter for medication refill 12/20/2018  . Aortic dissection, thoracic (HCC) 11/24/2018  . Polysubstance abuse (HCC) 04/07/2017  . Tobacco abuse 04/07/2017  . Hypertensive emergency 04/07/2017  . Hypokalemia 04/07/2017  . Hip pain 01/12/2014  . Sinus congestion 01/12/2014  . Asthma 01/06/2014  . HTN (hypertension) 12/30/2013  . Borderline hyperglycemia 12/30/2013  . Chronic generalized abdominal pain 12/30/2013  . Headache 12/30/2013  .  Urinary frequency 12/30/2013  . Non-suicidal depressed mood 12/30/2013  . Tobacco abuse counseling 12/30/2013  . Screening for hyperlipidemia 12/30/2013    Past Surgical History:  Procedure Laterality Date  . BENTALL PROCEDURE N/A 11/24/2018   Procedure: HEMIARCH REPAIR OF TYPE ONE AORTIC DISSECTION WITH ANTIGRADE CEREBRAL PERFUSSION AND MODERATE HYPOTHERMIC CIRCULATORY ARREST.;  Surgeon: Loreli Slot, MD;  Location: [facility] OR;  Service: Open Heart Surgery;  Laterality: N/A;  . DENTAL SURGERY    . IR THORACENTESIS ASP PLEURAL SPACE W/IMG GUIDE  12/31/2018       Family History  Problem Relation Age of Onset  . Hypertension Mother   . Diabetes Mellitus II Mother     Social History   Tobacco Use  . Smoking status: Current Every Day Smoker    Packs/day: 0.50    Types: Cigarettes  . Smokeless tobacco: Never Used  Vaping Use  . Vaping Use: Former  . Substances: Nicotine, Flavoring  Substance Use Topics  . Alcohol use: Not Currently    Alcohol/week: 5.0 standard drinks    Types: 5 Cans of beer per week    Comment: occ  . Drug use: Yes    Types: Marijuana    Comment: previous cocaine use    Home Medications Prior to Admission medications   Medication Sig Start Date End Date Taking? Authorizing Provider  amLODipine (NORVASC) 5 MG tablet Take 1 tablet (5 mg total) by mouth daily. 04/19/20   Joseph Art, DO  carvedilol (COREG) 25 MG tablet Take  1 tablet (25 mg total) by mouth 2 (two) times daily. 11/30/18   Barrett, Erin R, PA-C  cloNIDine (CATAPRES) 0.2 MG tablet Take 1 tablet (0.2 mg total) by mouth 2 (two) times daily. 12/29/18   Loreli Slot, MD  omeprazole (PRILOSEC) 20 MG capsule Take 20 mg by mouth daily. Newly prescribed 04/17/20   [provider]  sucralfate (CARAFATE) 1 g tablet Take 1 g by mouth 4 (four) times daily. Not taking yet 04/17/20   [provider]    Allergies    Carrot [daucus carota], Banana, and Breo ellipta [fluticasone  furoate-vilanterol]  Review of Systems   Review of Systems  Constitutional: Negative for fever.  HENT: Negative for congestion, sinus pressure and sore throat.   Eyes: Negative for pain, redness and visual disturbance.  Respiratory: Negative for cough and shortness of breath.   Cardiovascular: Negative for chest pain.  Gastrointestinal: Negative for abdominal pain and vomiting.  Genitourinary: Negative for dysuria, flank pain and hematuria.  Musculoskeletal: Positive for back pain. Negative for neck pain.  Skin: Negative for rash.  Neurological: Positive for headaches. Negative for syncope, speech difficulty and weakness.  Hematological: Does not bruise/bleed easily.  Psychiatric/Behavioral: Negative for confusion.    Physical Exam Updated Vital Signs BP (!) 212/153 (BP Location: Right Arm)   Pulse 87   Temp (!) 97.4 F (36.3 C) (Oral)   Resp 16   SpO2 100%   Physical Exam Vitals and nursing note reviewed.  Constitutional:      Appearance: Normal appearance. He is well-developed.  HENT:     Head: Atraumatic.     Comments: No sinus or temporal tenderness.     Nose: Nose normal.     Mouth/Throat:     Mouth: Mucous membranes are moist.     Pharynx: Oropharynx is clear.  Eyes:     General: No scleral icterus.    Extraocular Movements: Extraocular movements intact.     Conjunctiva/sclera: Conjunctivae normal.     Pupils: Pupils are equal, round, and reactive to light.  Neck:     Vascular: No carotid bruit.     Trachea: No tracheal deviation.  Cardiovascular:     Rate and Rhythm: Normal rate and regular rhythm.     Pulses: Normal pulses.     Heart sounds: Normal heart sounds. No murmur heard. No friction rub. No gallop.   Pulmonary:     Effort: Pulmonary effort is normal. No accessory muscle usage or respiratory distress.     Breath sounds: Normal breath sounds.  Abdominal:     General: Bowel sounds are normal. There is no distension.     Palpations: Abdomen is soft.      Tenderness: There is no abdominal tenderness. There is no guarding.  Genitourinary:    Comments: No cva tenderness. Musculoskeletal:        General: No swelling or tenderness.     Cervical back: Normal range of motion and neck supple. No rigidity.     Right lower leg: No edema.     Left lower leg: No edema.     Comments: T/L/S spine non tender, aligned, no step off. Good rom right hip without pain. Distal pulses palp bil.   Skin:    General: Skin is warm and dry.     Findings: No rash.  Neurological:     Mental Status: He is alert.     Comments: Alert, speech clear. No dysarthria or aphasia. Motor intact bil, stre 5/5. Sens  grossly intact bil. Steady gait.   Psychiatric:        Mood and Affect: Mood normal.     ED Results / Procedures / Treatments   Labs (all labs ordered are listed, but only abnormal results are displayed) Results for orders placed or performed during the hospital encounter of 01/18/21  Protime-INR  Result Value Ref Range   Prothrombin Time 12.4 11.4 - 15.2 seconds   INR 1.0 0.8 - 1.2  APTT  Result Value Ref Range   aPTT 41 (H) 24 - 36 seconds  CBC  Result Value Ref Range   WBC 6.1 4.0 - 10.5 K/uL   RBC 5.64 4.22 - 5.81 MIL/uL   Hemoglobin 17.7 (H) 13.0 - 17.0 g/dL   HCT 10.2 72.5 - 36.6 %   MCV 92.2 80.0 - 100.0 fL   MCH 31.4 26.0 - 34.0 pg   MCHC 34.0 30.0 - 36.0 g/dL   RDW 44.0 34.7 - 42.5 %   Platelets 287 150 - 400 K/uL   nRBC 0.0 0.0 - 0.2 %  Differential  Result Value Ref Range   Neutrophils Relative % 42 %   Neutro Abs 2.5 1.7 - 7.7 K/uL   Lymphocytes Relative 39 %   Lymphs Abs 2.4 0.7 - 4.0 K/uL   Monocytes Relative 8 %   Monocytes Absolute 0.5 0.1 - 1.0 K/uL   Eosinophils Relative 10 %   Eosinophils Absolute 0.6 (H) 0.0 - 0.5 K/uL   Basophils Relative 1 %   Basophils Absolute 0.0 0.0 - 0.1 K/uL   Immature Granulocytes 0 %   Abs Immature Granulocytes 0.02 0.00 - 0.07 K/uL  Comprehensive metabolic panel  Result Value Ref Range    Sodium 136 135 - 145 mmol/L   Potassium 3.5 3.5 - 5.1 mmol/L   Chloride 102 98 - 111 mmol/L   CO2 26 22 - 32 mmol/L   Glucose, Bld 108 (H) 70 - 99 mg/dL   BUN 10 6 - 20 mg/dL   Creatinine, Ser 9.56 0.61 - 1.24 mg/dL   Calcium 9.4 8.9 - 38.7 mg/dL   Total Protein 6.9 6.5 - 8.1 g/dL   Albumin 3.8 3.5 - 5.0 g/dL   AST 21 15 - 41 U/L   ALT 15 0 - 44 U/L   Alkaline Phosphatase 70 38 - 126 U/L   Total Bilirubin 0.7 0.3 - 1.2 mg/dL   GFR, Estimated >56 >43 mL/min   Anion gap 8 5 - 15  I-stat chem 8, ED  Result Value Ref Range   Sodium 140 135 - 145 mmol/L   Potassium 3.4 (L) 3.5 - 5.1 mmol/L   Chloride 102 98 - 111 mmol/L   BUN 12 6 - 20 mg/dL   Creatinine, Ser 3.29 0.61 - 1.24 mg/dL   Glucose, Bld 99 70 - 99 mg/dL   Calcium, Ion 5.18 8.41 - 1.40 mmol/L   TCO2 27 22 - 32 mmol/L   Hemoglobin 18.0 (H) 13.0 - 17.0 g/dL   HCT 66.0 (H) 63.0 - 16.0 %   CT HEAD WO CONTRAST  Result Date: 01/18/2021 CLINICAL DATA:  Tingling to the fingers of left hand toes on his left foot with a headache which began 2 hours prior are EXAM: CT HEAD WITHOUT CONTRAST TECHNIQUE: Contiguous axial images were obtained from the base of the skull through the vertex without intravenous contrast. COMPARISON:  None. FINDINGS: Brain: No evidence of acute infarction, hemorrhage, hydrocephalus, extra-axial collection, visible mass lesion or mass effect. Vascular:  No hyperdense vessel or unexpected calcification. Skull: No calvarial fracture or suspicious osseous lesion. No scalp swelling or hematoma. Lentiform fat attenuation probable lipoma across the left frontal scalp measuring approximately 2.4 x 0.6 cm. Larger similar appearing probable lipoma of the high right parietal scalp measuring 3.1 x 1.1 cm. Sinuses/Orbits: Tiny 5 mm left anterior ethmoidal sclerotic focus, likely osteoma. Mild mural thickening in the ethmoid and maxillary sinuses. No pneumatized secretions or air-fluid levels. Other: None. IMPRESSION: 1. No acute  intracranial abnormality. 2. Probable lipomas across the left frontal and right parietal scalp. 3. Small left ethmoid probable osteoma. Electronically Signed   By: Kreg ShropshirePrice  DeHay M.D.   On: 01/18/2021 22:15    EKG EKG Interpretation  Date/Time:  Thursday January 18 2021 21:45:48 EDT Ventricular Rate:  85 PR Interval:  150 QRS Duration: 92 QT Interval:  384 QTC Calculation: 456 R Axis:   90 Text Interpretation: Normal sinus rhythm Rightward axis Biventricular hypertrophy Non-specific ST-t changes Confirmed by Cathren LaineSteinl, Hasana Alcorta ([id number]) on 01/18/2021 11:07:33 PM   Radiology CT HEAD WO CONTRAST  Result Date: 01/18/2021 CLINICAL DATA:  Tingling to the fingers of left hand toes on his left foot with a headache which began 2 hours prior are EXAM: CT HEAD WITHOUT CONTRAST TECHNIQUE: Contiguous axial images were obtained from the base of the skull through the vertex without intravenous contrast. COMPARISON:  None. FINDINGS: Brain: No evidence of acute infarction, hemorrhage, hydrocephalus, extra-axial collection, visible mass lesion or mass effect. Vascular: No hyperdense vessel or unexpected calcification. Skull: No calvarial fracture or suspicious osseous lesion. No scalp swelling or hematoma. Lentiform fat attenuation probable lipoma across the left frontal scalp measuring approximately 2.4 x 0.6 cm. Larger similar appearing probable lipoma of the high right parietal scalp measuring 3.1 x 1.1 cm. Sinuses/Orbits: Tiny 5 mm left anterior ethmoidal sclerotic focus, likely osteoma. Mild mural thickening in the ethmoid and maxillary sinuses. No pneumatized secretions or air-fluid levels. Other: None. IMPRESSION: 1. No acute intracranial abnormality. 2. Probable lipomas across the left frontal and right parietal scalp. 3. Small left ethmoid probable osteoma. Electronically Signed   By: Kreg ShropshirePrice  DeHay M.D.   On: 01/18/2021 22:15    Procedures Procedures   Medications Ordered in ED Medications  sodium chloride  flush (NS) 0.9 % injection 3 mL (has no administration in time range)  cloNIDine (CATAPRES) tablet 0.2 mg (has no administration in time range)  carvedilol (COREG) tablet 25 mg (has no administration in time range)  oxyCODONE-acetaminophen (PERCOCET/ROXICET) 5-325 MG per tablet 1 tablet (has no administration in time range)    ED Course  I have reviewed the triage vital signs and the nursing notes.  Pertinent labs & imaging results that were available during my care of the patient were reviewed by me and considered in my medical decision making (see chart for details).    MDM Rules/Calculators/A&P                         Iv ns. Continuous pulse ox and monitor. Stat labs. Imaging.   Reviewed nursing notes and prior charts for additional history.   CT reviewed/linterpreted by me - no hem.   Pt given his evening dose of his bp meds.   Labs reviewed/interpreted by me - chem normal. Wbc normal.   Recheck, bp remains high. Labetalol iv.   Given severe hypertension, with LUE/LLE tingling/paresthesias, will admit re hypertensive urgency.   Medicine consulted for admission.     Final Clinical  Impression(s) / ED Diagnoses Final diagnoses:  None    Rx / DC Orders ED Discharge Orders    None       Cathren Laine, MD 01/19/21 [phone]

## 2021-01-18 NOTE — ED Triage Notes (Signed)
Pt c/o tingling to the fingers on his left hand, and toes on his left foot along with a headache that started 2 hours ago. Also reports lower back pain. A&O x 4, ambulatory without difficulty.

## 2021-01-19 ENCOUNTER — Encounter (HOSPITAL_COMMUNITY): Payer: Self-pay | Admitting: Internal Medicine

## 2021-01-19 ENCOUNTER — Observation Stay (HOSPITAL_COMMUNITY): Payer: 59

## 2021-01-19 DIAGNOSIS — I160 Hypertensive urgency: Principal | ICD-10-CM

## 2021-01-19 DIAGNOSIS — I158 Other secondary hypertension: Secondary | ICD-10-CM | POA: Diagnosis not present

## 2021-01-19 DIAGNOSIS — F1910 Other psychoactive substance abuse, uncomplicated: Secondary | ICD-10-CM

## 2021-01-19 DIAGNOSIS — I10 Essential (primary) hypertension: Secondary | ICD-10-CM

## 2021-01-19 LAB — BASIC METABOLIC PANEL
Anion gap: 7 (ref 5–15)
BUN: 13 mg/dL (ref 6–20)
CO2: 26 mmol/L (ref 22–32)
Calcium: 9 mg/dL (ref 8.9–10.3)
Chloride: 103 mmol/L (ref 98–111)
Creatinine, Ser: 1.06 mg/dL (ref 0.61–1.24)
GFR, Estimated: >60 mL/min (ref >60)
Glucose, Bld: 108 mg/dL — ABNORMAL HIGH (ref 70–99)
Potassium: 3.5 mmol/L (ref 3.5–5.1)
Sodium: 136 mmol/L (ref 135–145)

## 2021-01-19 LAB — CBC
HCT: 48.6 % (ref 39.0–52.0)
Hemoglobin: 16.3 g/dL (ref 13.0–17.0)
MCH: 31.8 pg (ref 26.0–34.0)
MCHC: 33.5 g/dL (ref 30.0–36.0)
MCV: 94.9 fL (ref 80.0–100.0)
Platelets: 251 10*3/uL (ref 150–400)
RBC: 5.12 MIL/uL (ref 4.22–5.81)
RDW: 14.2 % (ref 11.5–15.5)
WBC: 6.3 10*3/uL (ref 4.0–10.5)
nRBC: 0 % (ref 0.0–0.2)

## 2021-01-19 LAB — RAPID URINE DRUG SCREEN, HOSP PERFORMED
Amphetamines: POSITIVE — AB
Barbiturates: NOT DETECTED
Benzodiazepines: NOT DETECTED
Cocaine: POSITIVE — AB
Opiates: NOT DETECTED
Tetrahydrocannabinol: POSITIVE — AB

## 2021-01-19 LAB — HIV ANTIBODY (ROUTINE TESTING W REFLEX): HIV Screen 4th Generation wRfx: NONREACTIVE

## 2021-01-19 LAB — RESP PANEL BY RT-PCR (FLU A&B, COVID) ARPGX2
Influenza A by PCR: NEGATIVE
Influenza B by PCR: NEGATIVE
SARS Coronavirus 2 by RT PCR: NEGATIVE

## 2021-01-19 MED ORDER — HYDRALAZINE HCL 20 MG/ML IJ SOLN: 10.0000 mg | INTRAMUSCULAR | Status: DC | PRN

## 2021-01-19 MED ORDER — ENOXAPARIN SODIUM 60 MG/0.6ML ~~LOC~~ SOLN
50.0000 mg | SUBCUTANEOUS | Status: DC
  Administered 2021-01-19: 50 mg via SUBCUTANEOUS
  Filled 2021-01-19: qty 0.5

## 2021-01-19 MED ORDER — IOHEXOL 350 MG/ML SOLN
100.0000 mL | Freq: Once | INTRAVENOUS | Status: AC | PRN
  Administered 2021-01-19: 100 mL via INTRAVENOUS

## 2021-01-19 MED ORDER — HYDRALAZINE HCL 20 MG/ML IJ SOLN
10.0000 mg | INTRAMUSCULAR | Status: DC | PRN
  Administered 2021-01-19: 20 mg via INTRAVENOUS
  Administered 2021-01-19: 10 mg via INTRAVENOUS
  Administered 2021-01-20: 20 mg via INTRAVENOUS
  Administered 2021-01-20: 10 mg via INTRAVENOUS
  Filled 2021-01-19 (×4): qty 1

## 2021-01-19 MED ORDER — ONDANSETRON HCL 4 MG PO TABS: 4.0000 mg | ORAL_TABLET | Freq: Four times a day (QID) | ORAL | Status: DC | PRN

## 2021-01-19 MED ORDER — HYDROXYZINE HCL 25 MG PO TABS
50.0000 mg | ORAL_TABLET | Freq: Once | ORAL | Status: AC
  Administered 2021-01-19: 50 mg via ORAL
  Filled 2021-01-19: qty 2

## 2021-01-19 MED ORDER — LABETALOL HCL 5 MG/ML IV SOLN: 20.0000 mg | Freq: Once | INTRAVENOUS | Status: DC

## 2021-01-19 MED ORDER — OXYCODONE-ACETAMINOPHEN 5-325 MG PO TABS
1.0000 | ORAL_TABLET | Freq: Four times a day (QID) | ORAL | Status: DC | PRN
  Administered 2021-01-19 – 2021-01-20 (×2): 1 via ORAL
  Filled 2021-01-19 (×5): qty 1

## 2021-01-19 MED ORDER — AMLODIPINE BESYLATE 10 MG PO TABS
10.0000 mg | ORAL_TABLET | Freq: Every day | ORAL | Status: DC
  Administered 2021-01-19 – 2021-01-21 (×3): 10 mg via ORAL
  Filled 2021-01-19: qty 1
  Filled 2021-01-19: qty 2
  Filled 2021-01-19: qty 1

## 2021-01-19 MED ORDER — CLONIDINE HCL 0.2 MG PO TABS
0.2000 mg | ORAL_TABLET | Freq: Two times a day (BID) | ORAL | Status: DC
  Administered 2021-01-19: 0.2 mg via ORAL
  Filled 2021-01-19: qty 1

## 2021-01-19 MED ORDER — ACETAMINOPHEN 325 MG PO TABS
650.0000 mg | ORAL_TABLET | Freq: Four times a day (QID) | ORAL | Status: DC | PRN
  Administered 2021-01-19: 650 mg via ORAL
  Filled 2021-01-19: qty 2

## 2021-01-19 MED ORDER — ONDANSETRON HCL 4 MG/2ML IJ SOLN: 4.0000 mg | Freq: Four times a day (QID) | INTRAMUSCULAR | Status: DC | PRN

## 2021-01-19 MED ORDER — ACETAMINOPHEN 650 MG RE SUPP: 650.0000 mg | Freq: Four times a day (QID) | RECTAL | Status: DC | PRN

## 2021-01-19 MED ORDER — CARVEDILOL 12.5 MG PO TABS
25.0000 mg | ORAL_TABLET | Freq: Two times a day (BID) | ORAL | Status: DC
  Administered 2021-01-19 – 2021-01-21 (×4): 25 mg via ORAL
  Filled 2021-01-19 (×4): qty 2

## 2021-01-19 MED ORDER — CLONIDINE HCL 0.2 MG PO TABS: 0.2000 mg | ORAL_TABLET | Freq: Once | ORAL | Status: DC

## 2021-01-19 MED ORDER — CLONIDINE HCL 0.2 MG PO TABS
0.2000 mg | ORAL_TABLET | Freq: Two times a day (BID) | ORAL | Status: DC
  Administered 2021-01-19 – 2021-01-21 (×4): 0.2 mg via ORAL
  Filled 2021-01-19 (×4): qty 1

## 2021-01-19 NOTE — Progress Notes (Signed)
[facility] night shift.  The staff reports that the patient is hypertensive, anxious and would like treatment for this.  I have ordered hydroxyzine 50 mg p.o. x1 dose and a nice pharmacy to release his 2200 clonidine to be given now.  Sanda Klein, MD.

## 2021-01-19 NOTE — ED Notes (Addendum)
Per Dr. Julian Reil, patient's goal BP 170-160/120-110 x 24 hours. Labetalol not given r/t same.

## 2021-01-19 NOTE — H&P (Signed)
History and Physical    Stephen West:[id number] DOB: [birth year] DOA: 01/18/2021  PCP: Loletta Specter, PA-C  Patient coming from: Home  I have personally briefly reviewed patient's old medical records in [facility]  Chief Complaint: HTN  HPI: Stephen West is a 45 y.o. male with medical history significant of malignant HTN in setting of cocaine abuse, Type A aortic dissection in 2020 s/p surgical repair.  Pt presents to ED with c/o high BP.  Noted tingling to tips of fingers of L hand and L toes around 7pm today.  Symptoms onset acutely, moderate, constant, persistent.  Improving with BP control in ED.  Pt also has had low back pain for past 1 week, dull, achy.  Compliant with BP meds.  No fevers, chills, R sided numbness nor weakness.   ED Course: Pt initially claimed that he hasnt done cocaine in the past year.  However when confronted with the results of UDS today he admitted that yes he did do cocaine recently.  He also took what he thought was percocet for his L hip and low back pain; however, UDS was negative for opiates but instead positive for amphetamines.  He seemed surprised by the amphetamines and isnt sure where those got into his system from.   Review of Systems: As per HPI, otherwise all review of systems negative.  Past Medical History:  Diagnosis Date  . Bronchitis   . Hypertension   . NSTEMI (non-ST elevated myocardial infarction) [facility])     Past Surgical History:  Procedure Laterality Date  . BENTALL PROCEDURE N/A 11/24/2018   Procedure: HEMIARCH REPAIR OF TYPE ONE AORTIC DISSECTION WITH ANTIGRADE CEREBRAL PERFUSSION AND MODERATE HYPOTHERMIC CIRCULATORY ARREST.;  Surgeon: Loreli Slot, MD;  Location: [facility] OR;  Service: Open Heart Surgery;  Laterality: N/A;  . DENTAL SURGERY    . IR THORACENTESIS ASP PLEURAL SPACE W/IMG GUIDE  12/31/2018     reports that he has been smoking cigarettes. He has been smoking about 0.50 packs per day. He  has never used smokeless tobacco. He reports previous alcohol use of about 5.0 standard drinks of alcohol per week. He reports current drug use. Drug: Marijuana.  Allergies  Allergen Reactions  . Carrot [Daucus Carota] Anaphylaxis    Unknown  . Banana Nausea And Vomiting  . Breo Ellipta [Fluticasone Furoate-Vilanterol] Other (See Comments)    Fatigue, dizziness    Family History  Problem Relation Age of Onset  . Hypertension Mother   . Diabetes Mellitus II Mother      Prior to Admission medications   Medication Sig Start Date End Date Taking? Authorizing Provider  amLODipine (NORVASC) 10 MG tablet Take 10 mg by mouth daily. 12/31/20  Yes [provider]  carvedilol (COREG) 25 MG tablet Take 1 tablet (25 mg total) by mouth 2 (two) times daily. 11/30/18  Yes Barrett, Erin R, PA-C  cloNIDine (CATAPRES) 0.2 MG tablet Take 1 tablet (0.2 mg total) by mouth 2 (two) times daily. 12/29/18  Yes Loreli Slot, MD  SYMBICORT 80-4.5 MCG/ACT inhaler Inhale 1 puff into the lungs in the morning and at bedtime. 08/04/20  Yes [provider]  amLODipine (NORVASC) 5 MG tablet Take 1 tablet (5 mg total) by mouth daily. Patient not taking: Reported on 01/19/2021 04/19/20   Joseph Art, DO    Physical Exam: Vitals:   01/18/21 2320 01/18/21 2325 01/19/21 0023 01/19/21 0107  BP: (!) 169/121 (!) 169/121 (!) 167/120 (!) 151/101  Pulse: 80 85 81 85  Resp: 20  18 20   Temp: 98.4 F (36.9 C)     TempSrc: Oral     SpO2: 98%  98% 98%  Weight:   101.2 kg   Height:   5' 11.5" (1.816 m)     Constitutional: NAD, calm, comfortable Eyes: PERRL, lids and conjunctivae normal ENMT: Mucous membranes are moist. Posterior pharynx clear of any exudate or lesions.Normal dentition.  Neck: normal, supple, no masses, no thyromegaly Respiratory: clear to auscultation bilaterally, no wheezing, no crackles. Normal respiratory effort. No accessory muscle use.  Cardiovascular: Regular rate and  rhythm, no murmurs / rubs / gallops. No extremity edema. 2+ pedal pulses. No carotid bruits.  Abdomen: no tenderness, no masses palpated. No hepatosplenomegaly. Bowel sounds positive.  Musculoskeletal: no clubbing / cyanosis. No joint deformity upper and lower extremities. Good ROM, no contractures. Normal muscle tone.  Skin: no rashes, lesions, ulcers. No induration Neurologic: CN 2-12 grossly intact. Sensation intact, DTR normal. Strength 5/5 in all 4.  Psychiatric: Normal judgment and insight. Alert and oriented x 3. Normal mood.    Labs on Admission: I have personally reviewed following labs and imaging studies  CBC: Recent Labs  Lab 01/18/21 2150 01/18/21 2205  WBC 6.1  --   NEUTROABS 2.5  --   HGB 17.7* 18.0*  HCT 52.0 53.0*  MCV 92.2  --   PLT 287  --    Basic Metabolic Panel: Recent Labs  Lab 01/18/21 2150 01/18/21 2205  NA 136 140  K 3.5 3.4*  CL 102 102  CO2 26  --   GLUCOSE 108* 99  BUN 10 12  CREATININE 1.11 1.10  CALCIUM 9.4  --    GFR: Estimated Creatinine Clearance: 105.8 mL/min (by C-G formula based on SCr of 1.1 mg/dL). Liver Function Tests: Recent Labs  Lab 01/18/21 2150  AST 21  ALT 15  ALKPHOS 70  BILITOT 0.7  PROT 6.9  ALBUMIN 3.8   No results for input(s): LIPASE, AMYLASE in the last 168 hours. No results for input(s): AMMONIA in the last 168 hours. Coagulation Profile: Recent Labs  Lab 01/18/21 2150  INR 1.0   Cardiac Enzymes: No results for input(s): CKTOTAL, CKMB, CKMBINDEX, TROPONINI in the last 168 hours. BNP (last 3 results) No results for input(s): PROBNP in the last 8760 hours. HbA1C: No results for input(s): HGBA1C in the last 72 hours. CBG: No results for input(s): GLUCAP in the last 168 hours. Lipid Profile: No results for input(s): CHOL, HDL, LDLCALC, TRIG, CHOLHDL, LDLDIRECT in the last 72 hours. Thyroid Function Tests: No results for input(s): TSH, T4TOTAL, FREET4, T3FREE, THYROIDAB in the last 72 hours. Anemia  Panel: No results for input(s): VITAMINB12, FOLATE, FERRITIN, TIBC, IRON, RETICCTPCT in the last 72 hours. Urine analysis:    Component Value Date/Time   COLORURINE YELLOW 04/18/2020 1630   APPEARANCEUR CLEAR 04/18/2020 1630   LABSPEC 1.017 04/18/2020 1630   PHURINE 5.0 04/18/2020 1630   GLUCOSEU NEGATIVE 04/18/2020 1630   HGBUR NEGATIVE 04/18/2020 1630   BILIRUBINUR NEGATIVE 04/18/2020 1630   KETONESUR NEGATIVE 04/18/2020 1630   PROTEINUR NEGATIVE 04/18/2020 1630   UROBILINOGEN 1.0 12/06/2013 2053   NITRITE NEGATIVE 04/18/2020 1630   LEUKOCYTESUR NEGATIVE 04/18/2020 1630    Radiological Exams on Admission: CT HEAD WO CONTRAST  Result Date: 01/18/2021 CLINICAL DATA:  Tingling to the fingers of left hand toes on his left foot with a headache which began 2 hours prior are EXAM: CT HEAD WITHOUT  CONTRAST TECHNIQUE: Contiguous axial images were obtained from the base of the skull through the vertex without intravenous contrast. COMPARISON:  None. FINDINGS: Brain: No evidence of acute infarction, hemorrhage, hydrocephalus, extra-axial collection, visible mass lesion or mass effect. Vascular: No hyperdense vessel or unexpected calcification. Skull: No calvarial fracture or suspicious osseous lesion. No scalp swelling or hematoma. Lentiform fat attenuation probable lipoma across the left frontal scalp measuring approximately 2.4 x 0.6 cm. Larger similar appearing probable lipoma of the high right parietal scalp measuring 3.1 x 1.1 cm. Sinuses/Orbits: Tiny 5 mm left anterior ethmoidal sclerotic focus, likely osteoma. Mild mural thickening in the ethmoid and maxillary sinuses. No pneumatized secretions or air-fluid levels. Other: None. IMPRESSION: 1. No acute intracranial abnormality. 2. Probable lipomas across the left frontal and right parietal scalp. 3. Small left ethmoid probable osteoma. Electronically Signed   By: Kreg Shropshire M.D.   On: 01/18/2021 22:15    EKG: Independently  reviewed.  Assessment/Plan Principal Problem:   Hypertensive urgency Active Problems:   HTN (hypertension)   Polysubstance abuse (HCC)    1. HTN urgency - 1. Resume home BP meds 2. PRN hydralazine 3. Goal BP in first 24h = 170/120 4. Tele monitor 5. See discussion below 2. Polysubstance abuse - 1. Strongly suspect todays episode is induced by cocaine + amphetamines as seen on UDS 1. Pt admits to cocaine 2. Pt seemed genuinely surprised by the amphetamines 2. Discussed that cocaine patient getting off the street may be laced with amphetamines or other substances. 3. Discussed that pills that he got off street that he thought were percocet could have been amphetamine  4. Discussed that I though pt was at very high risk of dying of hypertensive emergency crisis (IE recurrent aortic dissection) with continued substance abuse. 5. SW consult  DVT prophylaxis: Lovenox Code Status: Full Family Communication: Wife at bedside, didn't discuss current substance use with wife present Disposition Plan: Home after BP controlled Consults called: None Admission status: Place in 37    Sameera Betton M. DO [facility]  How to contact the [facility] Attending or Consulting provider 7A - 7P or covering provider during after hours 7P -7A, for this patient?  1. Check the care team in [facility] and look for a) attending/consulting [facility] provider listed and b) the [facility] team listed 2. Log into [email or web address]  Am[employer] Physician Scheduling and messaging for groups and whole hospitals  On call and physician scheduling software for group practices, residents, hospitalists and other medical providers for call, clinic, rotation and shift schedules. On[employer] is a hospital-wide system for scheduling doctors and paging doctors on call. EasyPlot is for scientific plotting and data analysis.  [email or web address]  and use 's universal password to access. If you do not have the password, please contact the  hospital operator.  3. Locate the [facility] provider you are looking for under [facility] and page to a number that you can be directly reached. 4. If you still have difficulty reaching the provider, please page the [facility] (Director on Call) for the Hospitalists listed on amion for assistance.  01/19/2021, 1:51 AM

## 2021-01-19 NOTE — Progress Notes (Addendum)
Same day note  Patient seen and examined at bedside.  Patient was admitted to the hospital for better blood pressure, headache.  At the time of my evaluation, patient complains of headache being little better.  Complains of back pain with radiation of the pain on the right leg.  Blood pressure extremely elevated.  Physical examination reveals average built male in bed with high blood pressure.  Chest clear.  CVS okay.  Vitals with BMI 01/19/2021 01/19/2021 01/19/2021  Height - - -  Weight - - -  BMI - - -  Systolic 159 165 016  Diastolic 137 120 010  Pulse 61 74 69    Laboratory data and imaging was reviewed  Assessment and Plan.  Headache likely secondary to hypertensive urgency.  Likely exacerbated by cocaine abuse.  Counseling done.  Patient has been started on oral medications to gradually bring the blood pressure down.  Was on Coreg clonidine and amlodipine at home.  Has been reinitiated.  Will closely monitor.  CT head Scan was negative for acute findings. Patient with H/o type A aortic dissection in Feb 2020 s/p repair.   Lower Back pain.  Will get CT angiogram/dissection protocol to rule out any aortic pathology.  Could be secondary to spinal pathology.  Polysubstance abuse -drug screen is positive for cocaine, amphetamine, THC. Counseling done.  No Charge  Signed,  Tenny Craw, MD [facility]

## 2021-01-20 ENCOUNTER — Observation Stay (HOSPITAL_COMMUNITY): Payer: 59

## 2021-01-20 DIAGNOSIS — I160 Hypertensive urgency: Secondary | ICD-10-CM | POA: Diagnosis not present

## 2021-01-20 MED ORDER — HYDROMORPHONE HCL 1 MG/ML IJ SOLN
0.5000 mg | Freq: Once | INTRAMUSCULAR | Status: DC
  Filled 2021-01-20: qty 1

## 2021-01-20 MED ORDER — SALINE SPRAY 0.65 % NA SOLN
1.0000 | NASAL | Status: DC | PRN
  Filled 2021-01-20: qty 44

## 2021-01-20 MED ORDER — FLUTICASONE PROPIONATE 50 MCG/ACT NA SUSP: 2.0000 | Freq: Every day | NASAL | Status: DC

## 2021-01-20 MED ORDER — LORATADINE 10 MG PO TABS
10.0000 mg | ORAL_TABLET | Freq: Every day | ORAL | Status: DC | PRN
  Administered 2021-01-20: 10 mg via ORAL
  Filled 2021-01-20: qty 1

## 2021-01-20 NOTE — Evaluation (Signed)
Physical Therapy Evaluation & Discharge Patient Details Name: Stephen West MRN: [id number] DOB: 03/19/1976 Today's Date: 01/20/2021   History of Present Illness  Pt is a 45 y.o. male who presented 3/31 with HTN and tingling in L finger tips and L toes. UDS positive for amphetamines and pt reports recent use of cocaine, per chart. CT of head negative for acute intracranial abnormalities. CT abdomen and pelvis with and without contrast done on 01/19/2021 showed multilevel degenerative changes present in the imaged portions of the spine which is most pronounced at L4-L5 with resulting canal stenosis and bilateral foraminal narrowing. PMH: malignant HTN in setting of cocaine abuse, Type A aortic dissection in 2020 s/p surgical repair.    Clinical Impression  Pt presents with condition above and deficits mentioned below, see PT Problem List. 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 Pt is eager to get back to work but is limited in mobility by his R lower back pain that radiates into his posterior R leg down to his knee. Otherwise, pt is functioning at baseline, demonstrating symmetrical and intact bil UE and lower extremity strength, sensation, and coordination and is not at risk for falls. The pt points to his R SIJ when asked where the pain is. Pt educated on BLT spinal precautions and to use heat/cold at R SIJ along with gentle self-massage to the region to manage pain. Pt also educated on posterior pelvic tilt exercises for core stabilization as long as it does not cause pain. Pt educated to avoid painful positions and re-arrange work space if possible to allow region to heal, but pt reports his job requires him to bend over > 40-50x per shift, which significantly causes his pain. No further acute PT services needed, but pt would benefit from Outpatient PT services to address his pain and deficits.  Pain reproduced with the following: R slump test, R figure-4 position,  CPA mobs grade II to lumbar region, palpation of R SIJ (sidelying due to unable to get prone), and sacral thrust (sidelying due to unable to get prone).   Negative tests: L slump test and sacral compression and distraction tests.     Follow Up Recommendations Outpatient PT    Equipment Recommendations  None recommended by PT    Recommendations for Other Services       Precautions / Restrictions Restrictions Weight Bearing Restrictions: No      Mobility  Bed Mobility Overal bed mobility: Needs Assistance Bed Mobility: Rolling;Sidelying to Sit;Sit to Supine Rolling: Supervision Sidelying to sit: Supervision   Sit to supine: Supervision   General bed mobility comments: No physical assistance needed, only cues to assist with decreasing pain through log rolling in bed.    Transfers Overall transfer level: Independent Equipment used: None             General transfer comment: Pt able to come to stand without LOB, pain impacting posture.  Ambulation/Gait Ambulation/Gait assistance: Supervision Gait Distance (Feet): 250 Feet Assistive device: None Gait Pattern/deviations: Step-through pattern;Decreased step length - right;Decreased step length - left;Decreased stride length (R lateral and anterior slight trunk flexion) Gait velocity: reduced Gait velocity interpretation: <1.8 ft/sec, indicate of risk for recurrent falls General Gait Details: Pt ambulates slowly with slight anterior and R lateral trunk flexion due to his R lower back pain. No LOB or signs of instability, supervision for safety.  Stairs Stairs: Yes Stairs assistance: Min guard Stair Management: One rail Left;One rail Right;Step to pattern;Forwards  Number of Stairs: 6 General stair comments: Ascends with L rail and descends with R. Cued pt to lead up with good (L) and down with bad leg (R) to decrease his pain, mod success. No LOB but extra time due to pain, min guard for safety.  Wheelchair Mobility     Modified Rankin (Stroke Patients Only) Modified Rankin (Stroke Patients Only) Pre-Morbid Rankin Score: No symptoms Modified Rankin: No significant disability     Balance Overall balance assessment: No apparent balance deficits (not formally assessed)                                           Pertinent Vitals/Pain Pain Assessment: 0-10 Pain Score: 5  Pain Location: R SIJ region Pain Descriptors / Indicators: Aching;Sharp Pain Intervention(s): Limited activity within patient's tolerance;Monitored during session;Repositioned    Home Living Family/patient expects to be discharged to:: Private residence Living Arrangements: Spouse/significant other;Children (3 kids, 55 y.o., 55 y.o., 34 y.o.) Available Help at Discharge: Family;Available 24 hours/day Type of Home: House (townhome) Home Access: Level entry     Home Layout: Two level;Bed/bath upstairs Home Equipment: Walker - 2 wheels;Hand held shower head      Prior Function Level of Independence: Independent         Comments: Pt works as a Biomedical scientist. Pt drives. Pt independent with all functional mobility and ADLs.     Hand Dominance   Dominant Hand: Right    Extremity/Trunk Assessment   Upper Extremity Assessment Upper Extremity Assessment: Overall WFL for tasks assessed    Lower Extremity Assessment Lower Extremity Assessment: Overall WFL for tasks assessed    Cervical / Trunk Assessment Cervical / Trunk Assessment: Other exceptions Cervical / Trunk Exceptions: low back pain  Communication   Communication: No difficulties  Cognition Arousal/Alertness: Awake/alert Behavior During Therapy: WFL for tasks assessed/performed Overall Cognitive Status: Within Functional Limits for tasks assessed                                        General Comments General comments (skin integrity, edema, etc.): Pt points at R SIJ when asked where the pain is and he reports the pain shoots down  the back of his leg to the knee but not past the knee; Pain reproduced with the following = R slump test, R figure-4 position, CPA mobs grade II to lumbar region, palpation of R SIJ (sidelying due to unable to get prone), sacral thrust (sidelying due to unable to get prone); Negative tests = L slupm test, sacral compression and distraction    Exercises     Assessment/Plan    PT Assessment All further PT needs can be met in the next venue of care;Patent does not need any further PT services  PT Problem List Decreased range of motion;Decreased activity tolerance;Decreased mobility;Pain       PT Treatment Interventions      PT Goals (Current goals can be found in the Care Plan section)  Acute Rehab PT Goals Patient Stated Goal: to decrease his pain and return to work PT Goal Formulation: With patient Time For Goal Achievement: 01/21/21 Potential to Achieve Goals: Good    Frequency     Barriers to discharge        Co-evaluation  AM-PAC PT "6 Clicks" Mobility  Outcome Measure Help needed turning from your back to your side while in a flat bed without using bedrails?: A Little Help needed moving from lying on your back to sitting on the side of a flat bed without using bedrails?: A Little Help needed moving to and from a bed to a chair (including a wheelchair)?: None Help needed standing up from a chair using your arms (e.g., wheelchair or bedside chair)?: None Help needed to walk in hospital room?: A Little Help needed climbing 3-5 steps with a railing? : A Little 6 Click Score: 20    End of Session   Activity Tolerance: Patient tolerated treatment well Patient left: in chair;with call bell/phone within reach;Other (comment) (with heating pad to R SIJ) Nurse Communication: Mobility status PT Visit Diagnosis: Difficulty in walking, not elsewhere classified (R26.2);Pain Pain - Right/Left: Right Pain - part of body: Leg (back)    Time: 7829-5621 PT Time  Calculation (min) (ACUTE ONLY): 44 min   Charges:   PT Evaluation $PT Eval Low Complexity: 1 Low PT Treatments $Gait Training: 8-22 mins $Therapeutic Activity: 8-22 mins        Moishe Spice, PT, DPT Acute Rehabilitation Services  Pager: [phone] Office: [phone]   Orvan Falconer 01/20/2021, 3:04 PM

## 2021-01-20 NOTE — Progress Notes (Signed)
OT Cancellation Note  Patient Details Name: Stephen West MRN: [id number] DOB: 02-21-1976   Cancelled Treatment:    Reason Eval/Treat Not Completed: OT screened, no needs identified, will sign off.  Spoke with PT and pt seems to be at or close to baseline, and pt with negative MRI.   Eber Jones., OTR/L Acute Rehabilitation Services Pager [phone] Office [phone]   Jeani Hawking M 01/20/2021, 4:04 PM

## 2021-01-20 NOTE — Progress Notes (Signed)
CSW met with patient to discuss substance use and acknowledges TOC consult. Patient was adamant he was fine on substances and reports it was a night that go out of hand and refused resources or referrals. CSW will continue to follow.     01/20/21 1121  OTHER  Substance Abuse Education Offered Yes (Declined)  Substance abuse interventions Patient Counseling  ([facility]-AID) Substance Abuse Screening Tool  Have You Ever Felt You Ought to Cut Down on Your Drinking or Drug Use? 1  Have People Annoyed You By Critizing Your Drinking Or Drug Use? 1  Have You Felt Bad Or Guilty About Your Drinking Or Drug Use? 0

## 2021-01-20 NOTE — Progress Notes (Signed)
PROGRESS NOTE  Stephen West YQM:[id number] DOB: [birth year] DOA: 01/18/2021 PCP: Loletta Specter, PA-C  HPI/Recap of past 24 hours:  Stephen West is a 45 y.o. male with medical history significant of malignant HTN in setting of cocaine abuse, Type A aortic dissection in 2020 s/p surgical repair.  Pt presents to ED with c/o high BP.  Noted tingling to tips of fingers of L hand and L toes around 7pm today.  Symptoms onset acutely, moderate, constant, persistent.  Improving with BP control in ED. CT head nonacute.  Also reports significant lower back pain, present for a year but worsening in the last 1-2 weeks.  The pain radiates to his right thigh.  CT abdomen and pelvis with and without contrast done on 01/19/2021 showed multilevel degenerative changes present in the imaged portions of the spine which is most pronounced at L4-L5 with resulting canal stenosis and bilateral foraminal narrowing.  Also showing subcutaneous soft tissue stranding and gas along the low right anterolateral abdominal wall, correlate for injectable use.  Metal object external to the patient beneath the base of the penile shaft: With visual infection.  Patient denies any foreign body internally.  Assessment/Plan: Principal Problem:   Hypertensive urgency Active Problems:   HTN (hypertension)   Polysubstance abuse (HCC)  Hypertensive urgency Presented with significantly elevated blood pressure. Improved on the following antihypertensive regimen: Norvasc 10 mg daily, Coreg 25 mg twice daily, clonidine 0.2 mg twice daily. Continue to monitor vital signs  Severe lower back pain Reports chronic lower back pain however for the last 1 to 2 weeks has worsened.  CT abdomen and pelvis with and without contrast done on 01/19/2021 showed multilevel degenerative changes present in the imaged portions of the spine which is most pronounced at L4-L5 with resulting canal stenosis and bilateral foraminal narrowing.  Also showing  subcutaneous soft tissue stranding and gas along the low right anterolateral abdominal wall, correlate for injectable use.  Metal object external to the patient beneath the base of the penile shaft: With visual infection.  Patient denies any foreign body internally. He denies injecting drugs. We will get MRI lumbar spine to further assess.  Polysubstance abuse including cocaine and amphetamine UDS positive for cocaine and amphetamine Polysubstance cessation counseling at bedside, receptive. Patient has requested not to discuss current substance use with his wife.    Code Status: Full code.  Family Communication: Updated his wife via phone.  Patient has requested not to discuss current substance use with his wife.  Disposition Plan: Likely will discharge to home on 01/21/2021 once his symptomatology and BPs have improved.   Consultants:  None.  Procedures:  None.  Antimicrobials:  None.  DVT prophylaxis:  Subcu Lovenox daily.  Status is: Observation   Dispo:  Patient From: Home  Planned Disposition: Home  Medically stable for discharge: Yes       Objective: Vitals:   01/20/21 0516 01/20/21 0901 01/20/21 0902 01/20/21 1115  BP: (!) 151/104 (!) 163/122 (!) 172/122 139/81  Pulse: 64 69  77  Resp: 17   17  Temp: 97.7 F (36.5 C) 97.9 F (36.6 C)  97.8 F (36.6 C)  TempSrc: Oral Oral  Oral  SpO2: 100% 99%  99%  Weight:      Height:        Intake/Output Summary (Last 24 hours) at 01/20/2021 1211 Last data filed at 01/20/2021 0900 Gross per 24 hour  Intake 340 ml  Output --  Net 340 ml  Filed Weights   01/19/21 0023  Weight: 101.2 kg    Exam:  . General: 45 y.o. year-old male well developed well nourished in no acute distress.  Alert and oriented x3.  Uncomfortable due to lower back pain . Cardiovascular: Regular rate and rhythm with no rubs or gallops.  No thyromegaly or JVD noted.   Marland Kitchen Respiratory: Clear to auscultation with no wheezes or rales. Good  inspiratory effort. . Abdomen: Soft, tender with palpation of lower back as well as right flank nondistended with normal bowel sounds x4 quadrants. . Musculoskeletal: No lower extremity edema. 2/4 pulses in all 4 extremities. . Skin: No ulcerative lesions noted or rashes, . Psychiatry: Mood is appropriate for condition and setting   Data Reviewed: CBC: Recent Labs  Lab 01/18/21 2150 01/18/21 2205 01/19/21 0415  WBC 6.1  --  6.3  NEUTROABS 2.5  --   --   HGB 17.7* 18.0* 16.3  HCT 52.0 53.0* 48.6  MCV 92.2  --  94.9  PLT 287  --  251   Basic Metabolic Panel: Recent Labs  Lab 01/18/21 2150 01/18/21 2205 01/19/21 0415  NA 136 140 136  K 3.5 3.4* 3.5  CL 102 102 103  CO2 26  --  26  GLUCOSE 108* 99 108*  BUN 10 12 13   CREATININE 1.11 1.10 1.06  CALCIUM 9.4  --  9.0   GFR: Estimated Creatinine Clearance: 109.8 mL/min (by C-G formula based on SCr of 1.06 mg/dL). Liver Function Tests: Recent Labs  Lab 01/18/21 2150  AST 21  ALT 15  ALKPHOS 70  BILITOT 0.7  PROT 6.9  ALBUMIN 3.8   No results for input(s): LIPASE, AMYLASE in the last 168 hours. No results for input(s): AMMONIA in the last 168 hours. Coagulation Profile: Recent Labs  Lab 01/18/21 2150  INR 1.0   Cardiac Enzymes: No results for input(s): CKTOTAL, CKMB, CKMBINDEX, TROPONINI in the last 168 hours. BNP (last 3 results) No results for input(s): PROBNP in the last 8760 hours. HbA1C: No results for input(s): HGBA1C in the last 72 hours. CBG: No results for input(s): GLUCAP in the last 168 hours. Lipid Profile: No results for input(s): CHOL, HDL, LDLCALC, TRIG, CHOLHDL, LDLDIRECT in the last 72 hours. Thyroid Function Tests: No results for input(s): TSH, T4TOTAL, FREET4, T3FREE, THYROIDAB in the last 72 hours. Anemia Panel: No results for input(s): VITAMINB12, FOLATE, FERRITIN, TIBC, IRON, RETICCTPCT in the last 72 hours. Urine analysis:    Component Value Date/Time   COLORURINE YELLOW  04/18/2020 1630   APPEARANCEUR CLEAR 04/18/2020 1630   LABSPEC 1.017 04/18/2020 1630   PHURINE 5.0 04/18/2020 1630   GLUCOSEU NEGATIVE 04/18/2020 1630   HGBUR NEGATIVE 04/18/2020 1630   BILIRUBINUR NEGATIVE 04/18/2020 1630   KETONESUR NEGATIVE 04/18/2020 1630   PROTEINUR NEGATIVE 04/18/2020 1630   UROBILINOGEN 1.0 12/06/2013 2053   NITRITE NEGATIVE 04/18/2020 1630   LEUKOCYTESUR NEGATIVE 04/18/2020 1630   Sepsis Labs: @LABRCNTIP (procalcitonin:4,lacticidven:4)  ) Recent Results (from the past 240 hour(s))  Resp Panel by RT-PCR (Flu A&B, Covid) Nasopharyngeal Swab     Status: None   Collection Time: 01/19/21 12:49 AM   Specimen: Nasopharyngeal Swab; Nasopharyngeal(NP) swabs in vial transport medium  Result Value Ref Range Status   SARS Coronavirus 2 by RT PCR NEGATIVE NEGATIVE Final    Comment: (NOTE) SARS-CoV-2 target nucleic acids are NOT DETECTED.  The SARS-CoV-2 RNA is generally detectable in upper respiratory specimens during the acute phase of infection. The lowest concentration of SARS-CoV-2  viral copies this assay can detect is 138 copies/mL. A negative result does not preclude SARS-Cov-2 infection and should not be used as the sole basis for treatment or other patient management decisions. A negative result may occur with  improper specimen collection/handling, submission of specimen other than nasopharyngeal swab, presence of viral mutation(s) within the areas targeted by this assay, and inadequate number of viral copies(<138 copies/mL). A negative result must be combined with clinical observations, patient history, and epidemiological information. The expected result is Negative.  Fact Sheet for Patients:  BloggerCourse.comhttps://www.fda.gov/media/152166/download  Fact Sheet for Healthcare Providers:  SeriousBroker.ithttps://www.fda.gov/media/152162/download  This test is no t yet approved or cleared by the [place] FDA and  has been authorized for detection and/or diagnosis of SARS-CoV-2  by FDA under an Emergency Use Authorization (EUA). This EUA will remain  in effect (meaning this test can be used) for the duration of the COVID-19 declaration under Section 564(b)(1) of the Act, 21 U.S.C.section 360bbb-3(b)(1), unless the authorization is terminated  or revoked sooner.       Influenza A by PCR NEGATIVE NEGATIVE Final   Influenza B by PCR NEGATIVE NEGATIVE Final    Comment: (NOTE) The Xpert Xpress SARS-CoV-2/FLU/RSV plus assay is intended as an aid in the diagnosis of influenza from Nasopharyngeal swab specimens and should not be used as a sole basis for treatment. Nasal washings and aspirates are unacceptable for Xpert Xpress SARS-CoV-2/FLU/RSV testing.  Fact Sheet for Patients: BloggerCourse.comhttps://www.fda.gov/media/152166/download  Fact Sheet for Healthcare Providers: SeriousBroker.ithttps://www.fda.gov/media/152162/download  This test is not yet approved or cleared by the [place] FDA and has been authorized for detection and/or diagnosis of SARS-CoV-2 by FDA under an Emergency Use Authorization (EUA). This EUA will remain in effect (meaning this test can be used) for the duration of the COVID-19 declaration under Section 564(b)(1) of the Act, 21 U.S.C. section 360bbb-3(b)(1), unless the authorization is terminated or revoked.  Performed at [facility], [address] [place], [place] [address]       Studies: CT Angio Chest/Abd/Pel for Dissection W and/or W/WO  Result Date: 01/19/2021 CLINICAL DATA:  Chest and back pain, dissection suspected, history of aortic dissection repair EXAM: CT ANGIOGRAPHY CHEST, ABDOMEN AND PELVIS TECHNIQUE: Non-contrast CT of the chest was initially obtained. Multidetector CT imaging through the chest, abdomen and pelvis was performed using the standard protocol during bolus administration of intravenous contrast. Multiplanar reconstructed images and MIPs were obtained and reviewed to evaluate the vascular anatomy. CONTRAST:  100mL OMNIPAQUE  IOHEXOL 350 MG/ML SOLN COMPARISON:  Noncontrast CT chest, abdomen and pelvis 04/18/2020, CT a chest 11/24/2018 FINDINGS: CTA CHEST FINDINGS Cardiovascular: Noncontrast CT of the chest performed initially redemonstrates the postsurgical changes from prior ascending thoracic aortic repair. No abnormal hyperdense mural thickening or altered configuration of the radiodense surgical material to suggest an acute intramural hematoma or other complication. Postcontrast administration, there is satisfactory opacification of the arterial vasculature. Minimal motion artifact at the aortic root. Postsurgical changes again noted along the ascending thoracic aorta without acute complication or worrisome abnormality of the thoracic aortic arch or descending thoracic aorta. No worrisome periaortic stranding, hemorrhage or other abnormality. Borderline cardiomegaly. Few scattered coronary artery calcifications. Central pulmonary arteries are normal caliber. No large central or lobar filling defects are seen within limitations of this non tailored examination of the pulmonary arteries. No major venous abnormalities. Mediastinum/Nodes: Redemonstration and interval stability of the numerous prominent mediastinal and hilar nodes seen bilaterally including a an 11 mm AP window lymph node, a  17 mm left hilar lymph node, and a 20 mm right infrahilar lymph node (7/71, 64). No worrisome supraclavicular or axillary adenopathy within the level of imaging. Postsurgical changes anterior mediastinum related to prior sternotomy and ascending thoracic aortic repair. Lungs/Pleura: Mild bronchitic features, similar to comparison is. Low volumes and atelectasis. Some additional mosaic attenuation in the mid to lower lungs, could reflect further atelectatic change, imaging during exhalation, versus small airways disease or air trapping. No focal consolidative process. No concerning pulmonary nodules or masses. Musculoskeletal: Postsurgical changes from  sternotomy with intact and aligned sternal sutures. No acute or conspicuous osseous lesions of the chest. Review of the MIP images confirms the above findings. CTA ABDOMEN AND PELVIS FINDINGS VASCULAR Aorta: Minimal calcified noncalcified atheromatous plaque within the abdominal aorta. Celiac: Patent without evidence of aneurysm, dissection, vasculitis or significant stenosis. SMA: Patent without evidence of aneurysm, dissection, vasculitis or significant stenosis. Renals: Single renal arteries bilaterally. IMA: Appears patent and normally opacified. No evidence of aneurysm, dissection or vasculitis. Inflow: Minimal atherosclerotic plaque in the common and internal iliac branches. No acute luminal abnormality, aneurysm, dissection or features of vasculitis. Included portions of the proximal outflow vessels including the common, superficial and deep femoral arteries are unremarkable. Veins: No obvious venous abnormality within the limitations of this arterial phase study. Review of the MIP images confirms the above findings. NON-VASCULAR Hepatobiliary: No worrisome focal liver abnormality is seen. Normal gallbladder. No visible calcified gallstones. No biliary ductal dilatation. Pancreas: No pancreatic ductal dilatation or surrounding inflammatory changes. Spleen: Normal in size. No concerning splenic lesions. Adrenals/Urinary Tract: Normal adrenal glands. Kidneys are normally located with symmetric enhancement. No suspicious renal lesion, urolithiasis or hydronephrosis. Urinary bladder is largely decompressed at the time of exam and therefore poorly evaluated by CT imaging. Mild bladder wall thickening could be related to underdistention though some faint perivesicular hazy stranding may suggest a cystitis. Stomach/Bowel: Distal esophagus, stomach and duodenal sweep are unremarkable. No small bowel wall thickening or dilatation. No evidence of obstruction. A normal appendix is visualized. No colonic dilatation or  wall thickening. Lymphatic: No suspicious or enlarged lymph nodes in the included abdominopelvic lymphatic chains. Reproductive: The prostate and seminal vesicles are unremarkable. Metallic object external to the patient beneath the base of the penile shaft, correlate with visual inspection. Other: Subcutaneous soft tissue stranding and gas along the low right anterolateral abdominal wall, correlate for injectable use. No abdominopelvic free air or fluid. No bowel containing hernia. Musculoskeletal: Multilevel degenerative changes are present in the imaged portions of the spine. features most pronounced L4-5 with resulting canal stenosis and bilateral foraminal narrowing. Review of the MIP images confirms the above findings. IMPRESSION: Vascular 1. Postsurgical changes from prior sternotomy and repair of a and ascending thoracic aorta and proximal arch. No discernible postsurgical complication. No evidence of acute aortic syndrome. 2. Aortic Atherosclerosis (ICD10-I70.0). No significant atheromatous plaque narrowing. 3. Minimal coronary artery atherosclerosis. Nonvascular 1. Redemonstration and interval stability of the numerous prominent mediastinal and hilar nodes seen bilaterally, nonspecific. 2. Mild bronchitic features, similar to comparison. 3. Mosaic attenuation in the mid to lower lungs, could reflect further atelectatic change, imaging during exhalation, versus small airways disease or air trapping. 4. Mild bladder wall thickening could be related to underdistention though some faint perivesicular hazy stranding may suggest a cystitis. Correlate with urinalysis. 5. Subcutaneous soft tissue stranding and gas along the low right anterolateral abdominal wall, correlate for injectable use. 6. Metallic object external to the patient beneath the base of the  penile shaft, correlate with visual inspection. Electronically Signed   By: Kreg Shropshire M.D.   On: 01/19/2021 18:13    Scheduled Meds: . amLODipine  10  mg Oral Daily  . carvedilol  25 mg Oral BID WC  . cloNIDine  0.2 mg Oral BID  . enoxaparin (LOVENOX) injection  50 mg Subcutaneous Q24H    Continuous Infusions:   LOS: 0 days     Darlin Drop, MD [facility] Pager [phone]  If 7PM-7AM, please contact night-coverage [email or web address] Password [facility] 01/20/2021, 12:11 PM

## 2021-01-21 DIAGNOSIS — I160 Hypertensive urgency: Secondary | ICD-10-CM | POA: Diagnosis not present

## 2021-01-21 MED ORDER — AMLODIPINE BESYLATE 10 MG PO TABS: 10.0000 mg | ORAL_TABLET | Freq: Every day | ORAL | 0 refills | Status: AC

## 2021-01-21 MED ORDER — HYDRALAZINE HCL 10 MG PO TABS
10.0000 mg | ORAL_TABLET | Freq: Two times a day (BID) | ORAL | Status: DC
  Administered 2021-01-21: 10 mg via ORAL
  Filled 2021-01-21: qty 1

## 2021-01-21 MED ORDER — CLONIDINE HCL 0.2 MG PO TABS: 0.2000 mg | ORAL_TABLET | Freq: Two times a day (BID) | ORAL | 0 refills | Status: AC

## 2021-01-21 MED ORDER — POLYETHYLENE GLYCOL 3350 17 G PO PACK: 17.0000 g | PACK | Freq: Every day | ORAL | 0 refills | Status: AC

## 2021-01-21 MED ORDER — HYDRALAZINE HCL 10 MG PO TABS: 10.0000 mg | ORAL_TABLET | Freq: Two times a day (BID) | ORAL | Status: DC

## 2021-01-21 MED ORDER — HYDRALAZINE HCL 10 MG PO TABS: 10.0000 mg | ORAL_TABLET | Freq: Two times a day (BID) | ORAL | 0 refills | Status: AC

## 2021-01-21 MED ORDER — CARVEDILOL 25 MG PO TABS: 25.0000 mg | ORAL_TABLET | Freq: Two times a day (BID) | ORAL | 0 refills | Status: AC

## 2021-01-21 MED ORDER — OXYCODONE-ACETAMINOPHEN 5-325 MG PO TABS: 1.0000 | ORAL_TABLET | Freq: Three times a day (TID) | ORAL | 0 refills | Status: AC | PRN

## 2021-01-21 NOTE — TOC Transition Note (Signed)
Transition of Care [facility]) - CM/SW Discharge Note   Patient Details  Name: Stephen West MRN: [id number] Date of Birth: [birth year]  [facility] CM/SW Contact:  Janae Bridgeman, RN Phone Number: 01/21/2021, 12:08 PM   Clinical Narrative:    Case management spoke with the patient on the phone today regarding transitions of care to home today.  The patient was set up with [facility] outpatient PT/OT per physician request.  He is planning on discharging home with his wife today, who is driving him home by car.  All appropriate follow up appointments were placed in the discharge instructions.  Patient to be discharged home.  No other needs for [facility] team for discharge.   Final next level of care: OP Rehab Barriers to Discharge: No Barriers Identified   Patient Goals and CMS Choice Patient states their goals for this hospitalization and ongoing recovery are:: Patient wants to discharge home with his wife today. CMS Medicare.gov Compare Post Acute Care list provided to:: Patient Choice offered to / list presented to : Patient  Discharge Placement                       Discharge Plan and Services In-house Referral: Clinical Social [facility] Discharge Planning Services: CM Consult,Follow-up appt scheduled                                 Social Determinants of Health (SDOH) Interventions     Readmission Risk Interventions No flowsheet data found.

## 2021-01-21 NOTE — Discharge Instructions (Signed)
Hypertension, Adult Hypertension is another name for high blood pressure. High blood pressure forces your heart to work harder to pump blood. This can cause problems over time. There are two numbers in a blood pressure reading. There is a top number (systolic) over a bottom number (diastolic). It is best to have a blood pressure that is below 120/80. Healthy choices can help lower your blood pressure, or you may need medicine to help lower it. What are the causes? The cause of this condition is not known. Some conditions may be related to high blood pressure. What increases the risk?  Smoking.  Having type 2 diabetes mellitus, high cholesterol, or both.  Not getting enough exercise or physical activity.  Being overweight.  Having too much fat, sugar, calories, or salt (sodium) in your diet.  Drinking too much alcohol.  Having long-term (chronic) kidney disease.  Having a family history of high blood pressure.  Age. Risk increases with age.  Race. You may be at higher risk if you are African American.  Gender. Men are at higher risk than women before age 45. After age 65, women are at higher risk than men.  Having obstructive sleep apnea.  Stress. What are the signs or symptoms?  High blood pressure may not cause symptoms. Very high blood pressure (hypertensive crisis) may cause: ? Headache. ? Feelings of worry or nervousness (anxiety). ? Shortness of breath. ? Nosebleed. ? A feeling of being sick to your stomach (nausea). ? Throwing up (vomiting). ? Changes in how you see. ? Very bad chest pain. ? Seizures. How is this treated?  This condition is treated by making healthy lifestyle changes, such as: ? Eating healthy foods. ? Exercising more. ? Drinking less alcohol.  Your health care provider may prescribe medicine if lifestyle changes are not enough to get your blood pressure under control, and if: ? Your top number is above 130. ? Your bottom number is above  80.  Your personal target blood pressure may vary. Follow these instructions at home: Eating and drinking  If told, follow the DASH eating plan. To follow this plan: ? Fill one half of your plate at each meal with fruits and vegetables. ? Fill one fourth of your plate at each meal with whole grains. Whole grains include whole-wheat pasta, brown rice, and whole-grain bread. ? Eat or drink low-fat dairy products, such as skim milk or low-fat yogurt. ? Fill one fourth of your plate at each meal with low-fat (lean) proteins. Low-fat proteins include fish, chicken without skin, eggs, beans, and tofu. ? Avoid fatty meat, cured and processed meat, or chicken with skin. ? Avoid pre-made or processed food.  Eat less than 1,500 mg of salt each day.  Do not drink alcohol if: ? Your doctor tells you not to drink. ? You are pregnant, may be pregnant, or are planning to become pregnant.  If you drink alcohol: ? Limit how much you use to:  0-1 drink a day for women.  0-2 drinks a day for men. ? Be aware of how much alcohol is in your drink. In the [place] one drink equals one 12 oz bottle of beer (355 mL), one 5 oz glass of wine (148 mL), or one 1 oz glass of hard liquor (44 mL).   Lifestyle  Work with your doctor to stay at a healthy weight or to lose weight. Ask your doctor what the best weight is for you.  Get at least 30 minutes of exercise most   days of the week. This may include walking, swimming, or biking.  Get at least 30 minutes of exercise that strengthens your muscles (resistance exercise) at least 3 days a week. This may include lifting weights or doing Pilates.  Do not use any products that contain nicotine or tobacco, such as cigarettes, e-cigarettes, and chewing tobacco. If you need help quitting, ask your doctor.  Check your blood pressure at home as told by your doctor.  Keep all follow-up visits as told by your doctor. This is important.   Medicines  Take over-the-counter  and prescription medicines only as told by your doctor. Follow directions carefully.  Do not skip doses of blood pressure medicine. The medicine does not work as well if you skip doses. Skipping doses also puts you at risk for problems.  Ask your doctor about side effects or reactions to medicines that you should watch for. Contact a doctor if you:  Think you are having a reaction to the medicine you are taking.  Have headaches that keep coming back (recurring).  Feel dizzy.  Have swelling in your ankles.  Have trouble with your vision. Get help right away if you:  Get a very bad headache.  Start to feel mixed up (confused).  Feel weak or numb.  Feel faint.  Have very bad pain in your: ? Chest. ? Belly (abdomen).  Throw up more than once.  Have trouble breathing. Summary  Hypertension is another name for high blood pressure.  High blood pressure forces your heart to work harder to pump blood.  For most people, a normal blood pressure is less than 120/80.  Making healthy choices can help lower blood pressure. If your blood pressure does not get lower with healthy choices, you may need to take medicine. This information is not intended to replace advice given to you by your health care provider. Make sure you discuss any questions you have with your health care provider. Document Revised: 06/17/2018 Document Reviewed: 06/17/2018 Elsevier Patient Education  2021 [employer]  Chronic Back Pain When back pain lasts longer than 3 months, it is called chronic back pain. Pain may get worse at certain times (flare-ups). There are things you can do at home to manage your pain. Follow these instructions at home: Pay attention to any changes in your symptoms. Take these actions to help with your pain: Managing pain and stiffness  If told, put ice on the painful area. Your doctor may tell you to use ice for 24-48 hours after the flare-up starts. To do this: ? Put ice in a  plastic bag. ? Place a towel between your skin and the bag. ? Leave the ice on for 20 minutes, 2-3 times a day.  If told, put heat on the painful area. Do this as often as told by your doctor. Use the heat source that your doctor recommends, such as a moist heat pack or a heating pad. ? Place a towel between your skin and the heat source. ? Leave the heat on for 20-30 minutes. ? Take off the heat if your skin turns bright red. This is especially important if you are unable to feel pain, heat, or cold. You may have a greater risk of getting burned.  Soak in a warm bath. This can help relieve pain.      Activity  Avoid bending and other activities that make pain worse.  When standing: ? Keep your upper back and neck straight. ? Keep your shoulders pulled back. ?  Avoid slouching.  When sitting: ? Keep your back straight. ? Relax your shoulders. Do not round your shoulders or pull them backward.  Do not sit or stand in one place for long periods of time.  Take short rest breaks during the day. Lying down or standing is usually better than sitting. Resting can help relieve pain.  When sitting or lying down for a long time, do some mild activity or stretching. This will help to prevent stiffness and pain.  Get regular exercise. Ask your doctor what activities are safe for you.  Do not lift anything that is heavier than 10 lb (4.5 kg) or the limit that you are told, until your doctor says that it is safe.  To prevent injury when you lift things: ? Bend your knees. ? Keep the weight close to your body. ? Avoid twisting.  Sleep on a firm mattress. Try lying on your side with your knees slightly bent. If you lie on your back, put a pillow under your knees.   Medicines  Treatment may include medicines for pain and swelling taken by mouth or put on the skin, prescription pain medicine, or muscle relaxants.  Take over-the-counter and prescription medicines only as told by your  doctor.  Ask your doctor if the medicine prescribed to you: ? Requires you to avoid driving or using machinery. ? Can cause trouble pooping (constipation). You may need to take these actions to prevent or treat trouble pooping:  Drink enough fluid to keep your pee (urine) pale yellow.  Take over-the-counter or prescription medicines.  Eat foods that are high in fiber. These include beans, whole grains, and fresh fruits and vegetables.  Limit foods that are high in fat and sugars. These include fried or sweet foods. General instructions  Do not use any products that contain nicotine or tobacco, such as cigarettes, e-cigarettes, and chewing tobacco. If you need help quitting, ask your doctor.  Keep all follow-up visits as told by your doctor. This is important. Contact a doctor if:  Your pain does not get better with rest or medicine.  Your pain gets worse, or you have new pain.  You have a high fever.  You lose weight very quickly.  You have trouble doing your normal activities. Get help right away if:  One or both of your legs or feet feel weak.  One or both of your legs or feet lose feeling (have numbness).  You have trouble controlling when you poop (have a bowel movement) or pee (urinate).  You have bad back pain and: ? You feel like you may vomit (nauseous), or you vomit. ? You have pain in your belly (abdomen). ? You have shortness of breath. ? You faint. Summary  When back pain lasts longer than 3 months, it is called chronic back pain.  Pain may get worse at certain times (flare-ups).  Use ice and heat as told by your doctor. Your doctor may tell you to use ice after flare-ups. This information is not intended to replace advice given to you by your health care provider. Make sure you discuss any questions you have with your health care provider. Document Revised: 11/17/2019 Document Reviewed: 11/17/2019 Elsevier Patient Education  2021 [employer]  Inc[employer]  https://www.mata.com/https://www.nhlbi.nih.gov/files/docs/public/heart/dash_brief.pdf">  DASH Eating Plan DASH stands for Dietary Approaches to Stop Hypertension. The DASH eating plan is a healthy eating plan that has been shown to:  Reduce high blood pressure (hypertension).  Reduce your risk for type 2 diabetes, heart disease,  and stroke.  Help with weight loss. What are tips for following this plan? Reading food labels  Check food labels for the amount of salt (sodium) per serving. Choose foods with less than 5 percent of the Daily Value of sodium. Generally, foods with less than 300 milligrams (mg) of sodium per serving fit into this eating plan.  To find whole grains, look for the word "whole" as the first word in the ingredient list. Shopping  Buy products labeled as "low-sodium" or "no salt added."  Buy fresh foods. Avoid canned foods and pre-made or frozen meals. Cooking  Avoid adding salt when cooking. Use salt-free seasonings or herbs instead of table salt or sea salt. Check with your health care provider or pharmacist before using salt substitutes.  Do not fry foods. Cook foods using healthy methods such as baking, boiling, grilling, roasting, and broiling instead.  Cook with heart-healthy oils, such as olive, canola, avocado, soybean, or sunflower oil. Meal planning  Eat a balanced diet that includes: ? 4 or more servings of fruits and 4 or more servings of vegetables each day. Try to fill one-half of your plate with fruits and vegetables. ? 6-8 servings of whole grains each day. ? Less than 6 oz (170 g) of lean meat, poultry, or fish each day. A 3-oz (85-g) serving of meat is about the same size as a deck of cards. One egg equals 1 oz (28 g). ? 2-3 servings of low-fat dairy each day. One serving is 1 cup (237 mL). ? 1 serving of nuts, seeds, or beans 5 times each week. ? 2-3 servings of heart-healthy fats. Healthy fats called omega-3 fatty acids are found in foods such as walnuts,  flaxseeds, fortified milks, and eggs. These fats are also found in cold-water fish, such as sardines, salmon, and mackerel.  Limit how much you eat of: ? Canned or prepackaged foods. ? Food that is high in trans fat, such as some fried foods. ? Food that is high in saturated fat, such as fatty meat. ? Desserts and other sweets, sugary drinks, and other foods with added sugar. ? Full-fat dairy products.  Do not salt foods before eating.  Do not eat more than 4 egg yolks a week.  Try to eat at least 2 vegetarian meals a week.  Eat more home-cooked food and less restaurant, buffet, and fast food.   Lifestyle  When eating at a restaurant, ask that your food be prepared with less salt or no salt, if possible.  If you drink alcohol: ? Limit how much you use to:  0-1 drink a day for women who are not pregnant.  0-2 drinks a day for men. ? Be aware of how much alcohol is in your drink. In the [place] one drink equals one 12 oz bottle of beer (355 mL), one 5 oz glass of wine (148 mL), or one 1 oz glass of hard liquor (44 mL). General information  Avoid eating more than 2,300 mg of salt a day. If you have hypertension, you may need to reduce your sodium intake to 1,500 mg a day.  Work with your health care provider to maintain a healthy body weight or to lose weight. Ask what an ideal weight is for you.  Get at least 30 minutes of exercise that causes your heart to beat faster (aerobic exercise) most days of the week. Activities may include walking, swimming, or biking.  Work with your health care provider or dietitian to adjust your eating  plan to your individual calorie needs. What foods should I eat? Fruits All fresh, dried, or frozen fruit. Canned fruit in natural juice (without added sugar). Vegetables Fresh or frozen vegetables (raw, steamed, roasted, or grilled). Low-sodium or reduced-sodium tomato and vegetable juice. Low-sodium or reduced-sodium tomato sauce and tomato paste.  Low-sodium or reduced-sodium canned vegetables. Grains Whole-grain or whole-wheat bread. Whole-grain or whole-wheat pasta. Brown rice. Orpah Cobb. Bulgur. Whole-grain and low-sodium cereals. Pita bread. Low-fat, low-sodium crackers. Whole-wheat flour tortillas. Meats and other proteins Skinless chicken or [place]. Ground chicken or [place]. Pork with fat trimmed off. Fish and seafood. Egg whites. Dried beans, peas, or lentils. Unsalted nuts, nut butters, and seeds. Unsalted canned beans. Lean cuts of beef with fat trimmed off. Low-sodium, lean precooked or cured meat, such as sausages or meat loaves. Dairy Low-fat (1%) or fat-free (skim) milk. Reduced-fat, low-fat, or fat-free cheeses. Nonfat, low-sodium ricotta or cottage cheese. Low-fat or nonfat yogurt. Low-fat, low-sodium cheese. Fats and oils Soft margarine without trans fats. Vegetable oil. Reduced-fat, low-fat, or light mayonnaise and salad dressings (reduced-sodium). Canola, safflower, olive, avocado, soybean, and sunflower oils. Avocado. Seasonings and condiments Herbs. Spices. Seasoning mixes without salt. Other foods Unsalted popcorn and pretzels. Fat-free sweets. The items listed above may not be a complete list of foods and beverages you can eat. Contact a dietitian for more information. What foods should I avoid? Fruits Canned fruit in a light or heavy syrup. Fried fruit. Fruit in cream or butter sauce. Vegetables Creamed or fried vegetables. Vegetables in a cheese sauce. Regular canned vegetables (not low-sodium or reduced-sodium). Regular canned tomato sauce and paste (not low-sodium or reduced-sodium). Regular tomato and vegetable juice (not low-sodium or reduced-sodium). Rosita Fire. Olives. Grains Baked goods made with fat, such as croissants, muffins, or some breads. Dry pasta or rice meal packs. Meats and other proteins Fatty cuts of meat. Ribs. Fried meat. Tomasa Blase. Bologna, salami, and other precooked or cured meats, such as  sausages or meat loaves. Fat from the back of a pig (fatback). Bratwurst. Salted nuts and seeds. Canned beans with added salt. Canned or smoked fish. Whole eggs or egg yolks. Chicken or [place] with skin. Dairy Whole or 2% milk, cream, and half-and-half. Whole or full-fat cream cheese. Whole-fat or sweetened yogurt. Full-fat cheese. Nondairy creamers. Whipped toppings. Processed cheese and cheese spreads. Fats and oils Butter. Stick margarine. Lard. Shortening. Ghee. Bacon fat. Tropical oils, such as coconut, palm kernel, or palm oil. Seasonings and condiments Onion salt, garlic salt, seasoned salt, table salt, and sea salt. Worcestershire sauce. Tartar sauce. Barbecue sauce. Teriyaki sauce. Soy sauce, including reduced-sodium. Steak sauce. Canned and packaged gravies. Fish sauce. Oyster sauce. Cocktail sauce. Store-bought horseradish. Ketchup. Mustard. Meat flavorings and tenderizers. Bouillon cubes. Hot sauces. Pre-made or packaged marinades. Pre-made or packaged taco seasonings. Relishes. Regular salad dressings. Other foods Salted popcorn and pretzels. The items listed above may not be a complete list of foods and beverages you should avoid. Contact a dietitian for more information. Where to find more information  National Heart, Lung, and Blood Institute: PopSteam.is  American Heart Association: www.heart.org  Academy of Nutrition and Dietetics: www.eatright.org  National Kidney Foundation: www.kidney.org Summary  The DASH eating plan is a healthy eating plan that has been shown to reduce high blood pressure (hypertension). It may also reduce your risk for type 2 diabetes, heart disease, and stroke.  When on the DASH eating plan, aim to eat more fresh fruits and vegetables, whole grains, lean proteins, low-fat dairy, and heart-healthy fats.  With the DASH eating plan, you should limit salt (sodium) intake to 2,300 mg a day. If you have hypertension, you may need to reduce your  sodium intake to 1,500 mg a day.  Work with your health care provider or dietitian to adjust your eating plan to your individual calorie needs. This information is not intended to replace advice given to you by your health care provider. Make sure you discuss any questions you have with your health care provider. Document Revised: 09/10/2019 Document Reviewed: 09/10/2019 Elsevier Patient Education  2021 [employer].

## 2021-01-21 NOTE — Discharge Summary (Signed)
Discharge Summary  HEYWOOD TOKUNAGA ZOX:[id number] DOB: [birth year]  PCP: Loletta Specter, PA-C  Admit date: 01/18/2021 Discharge date: 01/21/2021  Time spent: 35 minutes.  Recommendations for Outpatient Follow-up:  1. Follow-up with neurosurgery in 1 to 2 weeks. 2. Follow-up with your primary care provider in 1 to 2 weeks. 3. Continue PT at outpatient PT 4. Take your medications as prescribed  Discharge Diagnoses:  Active Hospital Problems   Diagnosis Date Noted  . Hypertensive urgency 01/19/2021  . Polysubstance abuse (HCC) 04/07/2017  . HTN (hypertension) 12/30/2013    Resolved Hospital Problems  No resolved problems to display.    Discharge Condition: Stable  Diet recommendation: Recommend low-salt, DASH diet.  Vitals:   01/21/21 0505 01/21/21 1140  BP: (!) 147/104 (!) 132/97  Pulse: 73 92  Resp: 19 19  Temp: 97.8 F (36.6 C) 97.9 F (36.6 C)  SpO2: 100% 95%    History of present illness:  Jailin Moomaw Whitneyis a 45 y.o.malewith medical history significant ofmalignant HTN in setting of cocaine abuse, Type A aortic dissection in 2020 s/p surgical repair.  Pt presents to ED with c/o high BP. Noted tingling to tips of fingers of L hand and L toes around 7pm on the day of presentation. Symptoms onset acutely, moderate, constant, persistent. Improving with BP control in ED. CT head nonacute.  Also reports significant lower back pain, present for a year but worsening in the last 1-2 weeks.  The pain radiates to his right thigh.  CT abdomen and pelvis with and without contrast done on 01/19/2021 showed multilevel degenerative changes present in the imaged portions of the spine which is most pronounced at L4-L5 with resulting canal stenosis and bilateral foraminal narrowing.  Also showing subcutaneous soft tissue stranding and gas along the low right anterolateral abdominal wall, correlate for injectable use.  Metal object external to the patient beneath the base of the penile  shaft: With visual infection.  Patient denies any foreign body internally.  MRI lumbar spine done on 01/20/2021 revealed: 1. At L4-L5 there is a large superiorly dissecting left subarticular disc protrusion which results in severe canal and severe left subarticular recess stenosis with impingement of the descending left L5 nerve roots. Moderate left foraminal stenosis at this level. 2. At L5-S1 there is moderate right foraminal stenosis. At this level a left paracentral disc protrusion likely contacts the descending left S1 nerve roots without impingement. 3. At L3-L4 there is mild bilateral foraminal stenosis and mild canal stenosis.  Was seen by PT with recommendation for outpatient PT.  A referral has been provided.  Curb sided with neurosurgery Meyran, NP on 01/21/2021, recommended outpatient follow-up.  A referral has been provided.  Patient was advised to call on Monday 01/22/21 to arrange for a post hospital follow-up an appointment.  01/21/21: Patient was seen and examined at his bedside.  BP is improved after addition of p.o. hydralazine 10 mg twice daily.  Latest BP 132/97, pulse 92, respiration rate 19, O2 saturation 95% on room air.  Patient is eager to go home.  Hospital Course:  Principal Problem:   Hypertensive urgency Active Problems:   HTN (hypertension)   Polysubstance abuse (HCC)  Uncontrolled hypertension/hypertensive urgency Presented with significantly elevated blood pressure.   He has denied noncompliance with his home oral antihypertensives. Prior to admission he was on this regimen: Norvasc 10 mg daily, Coreg 25 mg twice daily, clonidine 0.2 mg twice daily. Added p.o. hydralazine 10 mg twice daily Latest BP 132/97. Follow-up  with your primary care provider in 1 to 2 weeks.  Severe lower back pain Reports chronic lower back pain however for the last 1 to 2 weeks has worsened.  CT abdomen and pelvis with and without contrast done on 01/19/2021 showed multilevel  degenerative changes present in the imaged portions of the spine which is most pronounced at L4-L5 with resulting canal stenosis and bilateral foraminal narrowing.  Also showing subcutaneous soft tissue stranding and gas along the low right anterolateral abdominal wall, correlate for injectable use.  Metal object external to the patient beneath the base of the penile shaft: With visual infection.  Patient denies any foreign body internally. MRI lumbar spine with findings as stated above. Curb sided with neurosurgery on 01/21/2021, recommended outpatient follow-up. Patient advised to call neurosurgery's office on 01/22/2021 to set up an appointment.  Patient is agreeable. Analgesics as needed. Follow-up with neurosurgery.  Polysubstance abuse including cocaine and amphetamine UDS taken on 01/19/2021 positive for cocaine, THC, and amphetamine Polysubstance cessation counseling done at bedside, receptive. Patient has requested not to discuss current substance use with his wife.    Code Status: Full code.  Family Communication: Updated his wife via phone.  Patient has requested not to discuss current substance use with his wife.   Consultants:  Curb sided with neurosurgery Meyran, NP on 01/21/2021.  Procedures:  None.  Antimicrobials:  None.    Discharge Exam: BP (!) 132/97 (BP Location: Left Arm)   Pulse 92   Temp 97.9 F (36.6 C) (Oral)   Resp 19   Ht 5' 11.5" (1.816 m)   Wt 101.2 kg   SpO2 95%   BMI 30.67 kg/m  . General: 45 y.o. year-old male well developed well nourished in no acute distress.  Alert and oriented x3. . Cardiovascular: Regular rate and rhythm with no rubs or gallops.  No thyromegaly or JVD noted.   Marland Kitchen Respiratory: Clear to auscultation with no wheezes or rales. Good inspiratory effort. . Abdomen: Soft nontender nondistended with normal bowel sounds x4 quadrants. . Musculoskeletal: No lower extremity edema. 2/4 pulses in all 4 extremities. . Skin: No  ulcerative lesions noted or rashes, . Psychiatry: Mood is appropriate for condition and setting  Discharge Instructions You were cared for by a hospitalist during your hospital stay. If you have any questions about your discharge medications or the care you received while you were in the hospital after you are discharged, you can call the unit and asked to speak with the hospitalist on call if the hospitalist that took care of you is not available. Once you are discharged, your primary care physician will handle any further medical issues. Please note that NO REFILLS for any discharge medications will be authorized once you are discharged, as it is imperative that you return to your primary care physician (or establish a relationship with a primary care physician if you do not have one) for your aftercare needs so that they can reassess your need for medications and monitor your lab values.  Discharge Instructions    Ambulatory referral to Physical Therapy   Complete by: As directed      Allergies as of 01/21/2021      Reactions   Carrot [daucus Carota] Anaphylaxis   Unknown   Banana Nausea And Vomiting   Breo Ellipta [fluticasone Furoate-vilanterol] Other (See Comments)   Fatigue, dizziness      Medication List    TAKE these medications   amLODipine 10 MG tablet Commonly known as: NORVASC  Take 1 tablet (10 mg total) by mouth daily. What changed: Another medication with the same name was removed. Continue taking this medication, and follow the directions you see here.   carvedilol 25 MG tablet Commonly known as: COREG Take 1 tablet (25 mg total) by mouth 2 (two) times daily.   cloNIDine 0.2 MG tablet Commonly known as: CATAPRES Take 1 tablet (0.2 mg total) by mouth 2 (two) times daily.   hydrALAZINE 10 MG tablet Commonly known as: APRESOLINE Take 1 tablet (10 mg total) by mouth 2 (two) times daily.   oxyCODONE-acetaminophen 5-325 MG tablet Commonly known as:  PERCOCET/ROXICET Take 1 tablet by mouth every 8 (eight) hours as needed for up to 3 days for severe pain.   polyethylene glycol 17 g packet Commonly known as: MiraLax Take 17 g by mouth daily.   Symbicort 80-4.5 MCG/ACT inhaler Generic drug: budesonide-formoterol Inhale 1 puff into the lungs in the morning and at bedtime.      Allergies  Allergen Reactions  . Carrot [Daucus Carota] Anaphylaxis    Unknown  . Banana Nausea And Vomiting  . Breo Ellipta [Fluticasone Furoate-Vilanterol] Other (See Comments)    Fatigue, dizziness    Follow-up Information    Meyran, Tiana Loft, NP. Call in 1 day(s).   Specialty: Neurosurgery Why: please call for a post hospital follow up appointment  Contact information: [address] [place] [address] [phone]        Loletta Specter, PA-C. Call in 1 day(s).   Specialty: Physician Assistant Why: please call for a post hospital follow up appointment Contact information: [address] [place] [address] [phone]        Nahser, Deloris Ping, MD .   Specialty: Cardiology Contact information: [address] [place] [address] [phone]        [facility] Follow up.   Specialty: Rehabilitation Why: [facility] will be calling you to set up outpatient PT/OT therapy at their facility. Contact information: [address] [id number] mc [place] [address] [phone]               The results of significant diagnostics from this hospitalization (including imaging, microbiology, ancillary and laboratory) are listed below for reference.    Significant Diagnostic Studies: CT HEAD WO CONTRAST  Result Date: 01/18/2021 CLINICAL DATA:  Tingling to the fingers of left hand toes on his left foot with a headache which began 2 hours prior are EXAM: CT HEAD WITHOUT CONTRAST TECHNIQUE: Contiguous  axial images were obtained from the base of the skull through the vertex without intravenous contrast. COMPARISON:  None. FINDINGS: Brain: No evidence of acute infarction, hemorrhage, hydrocephalus, extra-axial collection, visible mass lesion or mass effect. Vascular: No hyperdense vessel or unexpected calcification. Skull: No calvarial fracture or suspicious osseous lesion. No scalp swelling or hematoma. Lentiform fat attenuation probable lipoma across the left frontal scalp measuring approximately 2.4 x 0.6 cm. Larger similar appearing probable lipoma of the high right parietal scalp measuring 3.1 x 1.1 cm. Sinuses/Orbits: Tiny 5 mm left anterior ethmoidal sclerotic focus, likely osteoma. Mild mural thickening in the ethmoid and maxillary sinuses. No pneumatized secretions or air-fluid levels. Other: None. IMPRESSION: 1. No acute intracranial abnormality. 2. Probable lipomas across the left frontal and right parietal scalp. 3. Small left ethmoid probable osteoma. Electronically Signed   By: Kreg Shropshire M.D.   On: 01/18/2021 22:15   MR LUMBAR SPINE  WO CONTRAST  Result Date: 01/20/2021 CLINICAL DATA:  Low back pain. EXAM: MRI LUMBAR SPINE WITHOUT CONTRAST TECHNIQUE: Multiplanar, multisequence MR imaging of the lumbar spine was performed. No intravenous contrast was administered. COMPARISON:  Lumbar radiographs August 11, 2019. FINDINGS: Segmentation:  Standard segmentation is assumed. Alignment: Mild (2 mm) of retrolisthesis of L4 on L5. Otherwise, no substantial sagittal subluxation. Vertebrae: Degenerative/discogenic endplate signal changes about the L4-L5 and L5-S1 discs. Vertebral body heights are maintained. No specific evidence of acute fracture, discitis/osteomyelitis, or suspicious bone lesion. Conus medullaris and cauda equina: Conus extends to the L1-L2 level. Conus appears normal. Paraspinal and other soft tissues: Unremarkable. Disc levels: T12-L1: Only imaged sagittally without evidence of  significant canal or foraminal stenosis. L1-L2: Mild facet hypertrophy without significant canal or foraminal stenosis. L2-L3: Mild broad disc bulge and mild bilateral facet hypertrophy with ligamentum flavum thickening. No significant canal or foraminal stenosis. Mild bilateral subarticular recess narrowing. L3-L4: Broad disc bulge with mild bilateral facet hypertrophy and ligamentum flavum thickening. Mild bilateral foraminal stenosis, mild canal stenosis, and mild bilateral subarticular recess stenosis. L4-L5: Severe disc desiccation and height loss. Broad disc bulge with superimposed large superiorly dissecting left subarticular disc protrusion. Resulting severe canal and severe left subarticular recess stenosis with impingement of the descending left L5 nerve roots. Moderate left foraminal stenosis. No significant right foraminal stenosis. L5-S1: Broad disc bulge with superimposed left paracentral disc protrusion. Moderate right foraminal stenosis. No significant canal stenosis. Disc protrusion likely contacts the descending left S1 nerve roots without impingement. IMPRESSION: 1. At L4-L5 there is a large superiorly dissecting left subarticular disc protrusion which results in severe canal and severe left subarticular recess stenosis with impingement of the descending left L5 nerve roots. Moderate left foraminal stenosis at this level. 2. At L5-S1 there is moderate right foraminal stenosis. At this level a left paracentral disc protrusion likely contacts the descending left S1 nerve roots without impingement. 3. At L3-L4 there is mild bilateral foraminal stenosis and mild canal stenosis. Electronically Signed   By: Feliberto Harts MD   On: 01/20/2021 18:54   CT Angio Chest/Abd/Pel for Dissection W and/or W/WO  Result Date: 01/19/2021 CLINICAL DATA:  Chest and back pain, dissection suspected, history of aortic dissection repair EXAM: CT ANGIOGRAPHY CHEST, ABDOMEN AND PELVIS TECHNIQUE: Non-contrast CT of the  chest was initially obtained. Multidetector CT imaging through the chest, abdomen and pelvis was performed using the standard protocol during bolus administration of intravenous contrast. Multiplanar reconstructed images and MIPs were obtained and reviewed to evaluate the vascular anatomy. CONTRAST:  OMNIPAQUE IOHEXOL 350 MG/ML SOLN COMPARISON:  Noncontrast CT chest, abdomen and pelvis 04/18/2020, CT a chest 11/24/2018 FINDINGS: CTA CHEST FINDINGS Cardiovascular: Noncontrast CT of the chest performed initially redemonstrates the postsurgical changes from prior ascending thoracic aortic repair. No abnormal hyperdense mural thickening or altered configuration of the radiodense surgical material to suggest an acute intramural hematoma or other complication. Postcontrast administration, there is satisfactory opacification of the arterial vasculature. Minimal motion artifact at the aortic root. Postsurgical changes again noted along the ascending thoracic aorta without acute complication or worrisome abnormality of the thoracic aortic arch or descending thoracic aorta. No worrisome periaortic stranding, hemorrhage or other abnormality. Borderline cardiomegaly. Few scattered coronary artery calcifications. Central pulmonary arteries are normal caliber. No large central or lobar filling defects are seen within limitations of this non tailored examination of the pulmonary arteries. No major venous abnormalities. Mediastinum/Nodes: Redemonstration and interval stability of the numerous prominent mediastinal and hilar  nodes seen bilaterally including a an 11 mm AP window lymph node, a 17 mm left hilar lymph node, and a 20 mm right infrahilar lymph node (7/71, 64). No worrisome supraclavicular or axillary adenopathy within the level of imaging. Postsurgical changes anterior mediastinum related to prior sternotomy and ascending thoracic aortic repair. Lungs/Pleura: Mild bronchitic features, similar to comparison is. Low  volumes and atelectasis. Some additional mosaic attenuation in the mid to lower lungs, could reflect further atelectatic change, imaging during exhalation, versus small airways disease or air trapping. No focal consolidative process. No concerning pulmonary nodules or masses. Musculoskeletal: Postsurgical changes from sternotomy with intact and aligned sternal sutures. No acute or conspicuous osseous lesions of the chest. Review of the MIP images confirms the above findings. CTA ABDOMEN AND PELVIS FINDINGS VASCULAR Aorta: Minimal calcified noncalcified atheromatous plaque within the abdominal aorta. Celiac: Patent without evidence of aneurysm, dissection, vasculitis or significant stenosis. SMA: Patent without evidence of aneurysm, dissection, vasculitis or significant stenosis. Renals: Single renal arteries bilaterally. IMA: Appears patent and normally opacified. No evidence of aneurysm, dissection or vasculitis. Inflow: Minimal atherosclerotic plaque in the common and internal iliac branches. No acute luminal abnormality, aneurysm, dissection or features of vasculitis. Included portions of the proximal outflow vessels including the common, superficial and deep femoral arteries are unremarkable. Veins: No obvious venous abnormality within the limitations of this arterial phase study. Review of the MIP images confirms the above findings. NON-VASCULAR Hepatobiliary: No worrisome focal liver abnormality is seen. Normal gallbladder. No visible calcified gallstones. No biliary ductal dilatation. Pancreas: No pancreatic ductal dilatation or surrounding inflammatory changes. Spleen: Normal in size. No concerning splenic lesions. Adrenals/Urinary Tract: Normal adrenal glands. Kidneys are normally located with symmetric enhancement. No suspicious renal lesion, urolithiasis or hydronephrosis. Urinary bladder is largely decompressed at the time of exam and therefore poorly evaluated by CT imaging. Mild bladder wall thickening  could be related to underdistention though some faint perivesicular hazy stranding may suggest a cystitis. Stomach/Bowel: Distal esophagus, stomach and duodenal sweep are unremarkable. No small bowel wall thickening or dilatation. No evidence of obstruction. A normal appendix is visualized. No colonic dilatation or wall thickening. Lymphatic: No suspicious or enlarged lymph nodes in the included abdominopelvic lymphatic chains. Reproductive: The prostate and seminal vesicles are unremarkable. Metallic object external to the patient beneath the base of the penile shaft, correlate with visual inspection. Other: Subcutaneous soft tissue stranding and gas along the low right anterolateral abdominal wall, correlate for injectable use. No abdominopelvic free air or fluid. No bowel containing hernia. Musculoskeletal: Multilevel degenerative changes are present in the imaged portions of the spine. features most pronounced L4-5 with resulting canal stenosis and bilateral foraminal narrowing. Review of the MIP images confirms the above findings. IMPRESSION: Vascular 1. Postsurgical changes from prior sternotomy and repair of a and ascending thoracic aorta and proximal arch. No discernible postsurgical complication. No evidence of acute aortic syndrome. 2. Aortic Atherosclerosis (ICD10-I70.0). No significant atheromatous plaque narrowing. 3. Minimal coronary artery atherosclerosis. Nonvascular 1. Redemonstration and interval stability of the numerous prominent mediastinal and hilar nodes seen bilaterally, nonspecific. 2. Mild bronchitic features, similar to comparison. 3. Mosaic attenuation in the mid to lower lungs, could reflect further atelectatic change, imaging during exhalation, versus small airways disease or air trapping. 4. Mild bladder wall thickening could be related to underdistention though some faint perivesicular hazy stranding may suggest a cystitis. Correlate with urinalysis. 5. Subcutaneous soft tissue  stranding and gas along the low right anterolateral abdominal wall, correlate for  injectable use. 6. Metallic object external to the patient beneath the base of the penile shaft, correlate with visual inspection. Electronically Signed   By: Kreg ShropshirePrice  DeHay M.D.   On: 01/19/2021 18:13    Microbiology: Recent Results (from the past 240 hour(s))  Resp Panel by RT-PCR (Flu A&B, Covid) Nasopharyngeal Swab     Status: None   Collection Time: 01/19/21 12:49 AM   Specimen: Nasopharyngeal Swab; Nasopharyngeal(NP) swabs in vial transport medium  Result Value Ref Range Status   SARS Coronavirus 2 by RT PCR NEGATIVE NEGATIVE Final    Comment: (NOTE) SARS-CoV-2 target nucleic acids are NOT DETECTED.  The SARS-CoV-2 RNA is generally detectable in upper respiratory specimens during the acute phase of infection. The lowest concentration of SARS-CoV-2 viral copies this assay can detect is 138 copies/mL. A negative result does not preclude SARS-Cov-2 infection and should not be used as the sole basis for treatment or other patient management decisions. A negative result may occur with  improper specimen collection/handling, submission of specimen other than nasopharyngeal swab, presence of viral mutation(s) within the areas targeted by this assay, and inadequate number of viral copies(<138 copies/mL). A negative result must be combined with clinical observations, patient history, and epidemiological information. The expected result is Negative.  Fact Sheet for Patients:  BloggerCourse.comhttps://www.fda.gov/media/152166/download  Fact Sheet for Healthcare Providers:  SeriousBroker.ithttps://www.fda.gov/media/152162/download  This test is no t yet approved or cleared by the [place] FDA and  has been authorized for detection and/or diagnosis of SARS-CoV-2 by FDA under an Emergency Use Authorization (EUA). This EUA will remain  in effect (meaning this test can be used) for the duration of the COVID-19 declaration under Section  564(b)(1) of the Act, 21 U.S.C.section 360bbb-3(b)(1), unless the authorization is terminated  or revoked sooner.       Influenza A by PCR NEGATIVE NEGATIVE Final   Influenza B by PCR NEGATIVE NEGATIVE Final    Comment: (NOTE) The Xpert Xpress SARS-CoV-2/FLU/RSV plus assay is intended as an aid in the diagnosis of influenza from Nasopharyngeal swab specimens and should not be used as a sole basis for treatment. Nasal washings and aspirates are unacceptable for Xpert Xpress SARS-CoV-2/FLU/RSV testing.  Fact Sheet for Patients: BloggerCourse.comhttps://www.fda.gov/media/152166/download  Fact Sheet for Healthcare Providers: SeriousBroker.ithttps://www.fda.gov/media/152162/download  This test is not yet approved or cleared by the [place] FDA and has been authorized for detection and/or diagnosis of SARS-CoV-2 by FDA under an Emergency Use Authorization (EUA). This EUA will remain in effect (meaning this test can be used) for the duration of the COVID-19 declaration under Section 564(b)(1) of the Act, 21 U.S.C. section 360bbb-3(b)(1), unless the authorization is terminated or revoked.  Performed at [facility], [address] [place], [place] [address]      Labs: Basic Metabolic Panel: Recent Labs  Lab 01/18/21 2150 01/18/21 2205 01/19/21 0415  NA 136 140 136  K 3.5 3.4* 3.5  CL 102 102 103  CO2 26  --  26  GLUCOSE 108* 99 108*  BUN 10 12 13   CREATININE 1.11 1.10 1.06  CALCIUM 9.4  --  9.0   Liver Function Tests: Recent Labs  Lab 01/18/21 2150  AST 21  ALT 15  ALKPHOS 70  BILITOT 0.7  PROT 6.9  ALBUMIN 3.8   No results for input(s): LIPASE, AMYLASE in the last 168 hours. No results for input(s): AMMONIA in the last 168 hours. CBC: Recent Labs  Lab 01/18/21 2150 01/18/21 2205 01/19/21 0415  WBC 6.1  --  6.3  NEUTROABS 2.5  --   --   HGB 17.7* 18.0* 16.3  HCT 52.0 53.0* 48.6  MCV 92.2  --  94.9  PLT 287  --  251   Cardiac Enzymes: No results for input(s): CKTOTAL,  CKMB, CKMBINDEX, TROPONINI in the last 168 hours. BNP: BNP (last 3 results) No results for input(s): BNP in the last 8760 hours.  ProBNP (last 3 results) No results for input(s): PROBNP in the last 8760 hours.  CBG: No results for input(s): GLUCAP in the last 168 hours.     Signed:  Darlin Drop, MD [facility] 01/21/2021, 12:15 PM

## 2021-01-22 ENCOUNTER — Telehealth: Payer: Self-pay

## 2021-01-22 NOTE — Telephone Encounter (Signed)
Transition Care Management Follow-up Telephone Call  Date of discharge and from where: [facility] on 01/20/2021  Called pt stated has insurance and has all his apt made with [facility] . Does not want appt to be seen at [facility].

## 2021-05-21 ENCOUNTER — Emergency Department (HOSPITAL_COMMUNITY): Payer: 59

## 2021-05-21 ENCOUNTER — Other Ambulatory Visit: Payer: Self-pay

## 2021-05-21 ENCOUNTER — Encounter (HOSPITAL_COMMUNITY): Payer: Self-pay

## 2021-05-21 ENCOUNTER — Emergency Department (HOSPITAL_COMMUNITY)
Admission: EM | Admit: 2021-05-21 | Discharge: 2021-05-21 | Disposition: A | Discharge status: Home / Self Care | Payer: 59 | Attending: Emergency Medicine | Admitting: Emergency Medicine

## 2021-05-21 DIAGNOSIS — Z79899 Other long term (current) drug therapy: Secondary | ICD-10-CM | POA: Insufficient documentation

## 2021-05-21 DIAGNOSIS — U071 COVID-19: Secondary | ICD-10-CM | POA: Insufficient documentation

## 2021-05-21 DIAGNOSIS — F17210 Nicotine dependence, cigarettes, uncomplicated: Secondary | ICD-10-CM | POA: Diagnosis not present

## 2021-05-21 DIAGNOSIS — R519 Headache, unspecified: Secondary | ICD-10-CM | POA: Diagnosis present

## 2021-05-21 DIAGNOSIS — I10 Essential (primary) hypertension: Secondary | ICD-10-CM | POA: Insufficient documentation

## 2021-05-21 LAB — BASIC METABOLIC PANEL
Anion gap: 9 (ref 5–15)
BUN: 9 mg/dL (ref 6–20)
CO2: 27 mmol/L (ref 22–32)
Calcium: 8.8 mg/dL — ABNORMAL LOW (ref 8.9–10.3)
Chloride: 98 mmol/L (ref 98–111)
Creatinine, Ser: 0.95 mg/dL (ref 0.61–1.24)
GFR, Estimated: >60 mL/min (ref >60)
Glucose, Bld: 103 mg/dL — ABNORMAL HIGH (ref 70–99)
Potassium: 3.1 mmol/L — ABNORMAL LOW (ref 3.5–5.1)
Sodium: 134 mmol/L — ABNORMAL LOW (ref 135–145)

## 2021-05-21 LAB — CBC WITH DIFFERENTIAL/PLATELET
Abs Immature Granulocytes: 0.04 10*3/uL (ref 0.00–0.07)
Basophils Absolute: 0 10*3/uL (ref 0.0–0.1)
Basophils Relative: 0 %
Eosinophils Absolute: 0 10*3/uL (ref 0.0–0.5)
Eosinophils Relative: 0 %
HCT: 48.1 % (ref 39.0–52.0)
Hemoglobin: 16.4 g/dL (ref 13.0–17.0)
Immature Granulocytes: 1 %
Lymphocytes Relative: 16 %
Lymphs Abs: 0.8 10*3/uL (ref 0.7–4.0)
MCH: 31 pg (ref 26.0–34.0)
MCHC: 34.1 g/dL (ref 30.0–36.0)
MCV: 90.9 fL (ref 80.0–100.0)
Monocytes Absolute: 1.3 10*3/uL — ABNORMAL HIGH (ref 0.1–1.0)
Monocytes Relative: 28 %
Neutro Abs: 2.5 10*3/uL (ref 1.7–7.7)
Neutrophils Relative %: 55 %
Platelets: 191 10*3/uL (ref 150–400)
RBC: 5.29 MIL/uL (ref 4.22–5.81)
RDW: 13.5 % (ref 11.5–15.5)
WBC: 4.6 10*3/uL (ref 4.0–10.5)
nRBC: 0 % (ref 0.0–0.2)

## 2021-05-21 LAB — RESP PANEL BY RT-PCR (FLU A&B, COVID) ARPGX2
Influenza A by PCR: NEGATIVE
Influenza B by PCR: NEGATIVE
SARS Coronavirus 2 by RT PCR: POSITIVE — AB

## 2021-05-21 MED ORDER — DROPERIDOL 2.5 MG/ML IJ SOLN
1.2500 mg | Freq: Once | INTRAMUSCULAR | Status: AC
  Administered 2021-05-21: 1.25 mg via INTRAVENOUS
  Filled 2021-05-21: qty 2

## 2021-05-21 MED ORDER — CLONIDINE HCL 0.1 MG PO TABS
0.2000 mg | ORAL_TABLET | Freq: Once | ORAL | Status: AC
  Administered 2021-05-21: 0.2 mg via ORAL
  Filled 2021-05-21: qty 2

## 2021-05-21 MED ORDER — SODIUM CHLORIDE 0.9 % IV BOLUS
500.0000 mL | Freq: Once | INTRAVENOUS | Status: AC
  Administered 2021-05-21: 500 mL via INTRAVENOUS

## 2021-05-21 MED ORDER — LABETALOL HCL 5 MG/ML IV SOLN
10.0000 mg | Freq: Once | INTRAVENOUS | Status: AC
  Administered 2021-05-21: 10 mg via INTRAVENOUS
  Filled 2021-05-21: qty 4

## 2021-05-21 MED ORDER — METOCLOPRAMIDE HCL 5 MG/ML IJ SOLN
10.0000 mg | Freq: Once | INTRAMUSCULAR | Status: AC
  Administered 2021-05-21: 10 mg via INTRAVENOUS
  Filled 2021-05-21: qty 2

## 2021-05-21 NOTE — ED Provider Notes (Signed)
[facility]-EMERGENCY DEPT Provider Note   CSN: [id number] Arrival date & time: 05/21/21  0410     History Chief Complaint  Patient presents with   Headache   Hypertension   Nasal Congestion   Chills    Stephen West is a 45 y.o. male.  The history is provided by the patient and medical records.  Headache Hypertension Associated symptoms include headaches.  Stephen West is a 45 y.o. male who presents to the Emergency Department complaining of HA.  He presents to the ED complaining of constant HA to the top of his head for the last two days.  Pain is described as pressure.  He has associated nasal congestion that he attributes to using cocaine, worse over the last week.  Last used cocaine one week ago.  He woke up with the HA on Saturday morning.  He has experienced similar HA in the past but usually they don't last this long.    No fever.  No cough, sob, N/V.  He has a hx/o HTN and is compliant with his meds.  Takes amolodipine, clonidine, pravastatin, carvedilol, imdur, hydralazine.     Past Medical History:  Diagnosis Date   Bronchitis    Hypertension    NSTEMI (non-ST elevated myocardial infarction) [facility])     Patient Active Problem List   Diagnosis Date Noted   Hypertensive urgency 01/19/2021   AKI (acute kidney injury) (HCC) 04/18/2020   Encounter for medication refill 12/20/2018   Aortic dissection, thoracic (HCC) 11/24/2018   Polysubstance abuse (HCC) 04/07/2017   Tobacco abuse 04/07/2017   Hypertensive emergency 04/07/2017   Hypokalemia 04/07/2017   Hip pain 01/12/2014   Sinus congestion 01/12/2014   Asthma 01/06/2014   HTN (hypertension) 12/30/2013   Borderline hyperglycemia 12/30/2013   Chronic generalized abdominal pain 12/30/2013   Headache 12/30/2013   Urinary frequency 12/30/2013   Non-suicidal depressed mood 12/30/2013   Tobacco abuse counseling 12/30/2013   Screening for hyperlipidemia 12/30/2013    Past Surgical  History:  Procedure Laterality Date   BENTALL PROCEDURE N/A 11/24/2018   Procedure: HEMIARCH REPAIR OF TYPE ONE AORTIC DISSECTION WITH ANTIGRADE CEREBRAL PERFUSSION AND MODERATE HYPOTHERMIC CIRCULATORY ARREST.;  Surgeon: Loreli Slot, MD;  Location: [facility] OR;  Service: Open Heart Surgery;  Laterality: N/A;   DENTAL SURGERY     IR THORACENTESIS ASP PLEURAL SPACE W/IMG GUIDE  12/31/2018       Family History  Problem Relation Age of Onset   Hypertension Mother    Diabetes Mellitus II Mother     Social History   Tobacco Use   Smoking status: Every Day    Packs/day: 0.50    Types: Cigarettes   Smokeless tobacco: Never  Vaping Use   Vaping Use: Former   Substances: Nicotine, Flavoring  Substance Use Topics   Alcohol use: Not Currently    Alcohol/week: 5.0 standard drinks    Types: 5 Cans of beer per week    Comment: occ   Drug use: Yes    Types: Marijuana    Comment: previous cocaine use    Home Medications Prior to Admission medications   Medication Sig Start Date End Date Taking? Authorizing Provider  amLODipine (NORVASC) 10 MG tablet Take 1 tablet (10 mg total) by mouth daily. 01/21/21 04/21/21  Darlin Drop, DO  carvedilol (COREG) 25 MG tablet Take 1 tablet (25 mg total) by mouth 2 (two) times daily. 01/21/21 04/21/21  Darlin Drop, DO  cloNIDine (CATAPRES) 0.2  MG tablet Take 1 tablet (0.2 mg total) by mouth 2 (two) times daily. 01/21/21 04/21/21  Darlin Drop, DO  hydrALAZINE (APRESOLINE) 10 MG tablet Take 1 tablet (10 mg total) by mouth 2 (two) times daily. 01/21/21 04/21/21  Darlin Drop, DO  polyethylene glycol (MIRALAX) 17 g packet Take 17 g by mouth daily. 01/21/21   Darlin Drop, DO  SYMBICORT 80-4.5 MCG/ACT inhaler Inhale 1 puff into the lungs in the morning and at bedtime. 08/04/20   [provider]    Allergies    Carrot [daucus carota], Banana, and Breo ellipta [fluticasone furoate-vilanterol]  Review of Systems   Review of Systems  Neurological:   Positive for headaches.  All other systems reviewed and are negative.  Physical Exam Updated Vital Signs BP (!) 178/134   Pulse 84   Temp 99.8 F (37.7 C) (Oral)   Resp 19   Ht 6' (1.829 m)   Wt 99.8 kg   SpO2 94%   BMI 29.84 kg/m   Physical Exam Vitals and nursing note reviewed.  Constitutional:      Appearance: He is well-developed.  HENT:     Head: Normocephalic and atraumatic.  Eyes:     Extraocular Movements: Extraocular movements intact.  Cardiovascular:     Rate and Rhythm: Normal rate and regular rhythm.     Heart sounds: No murmur heard. Pulmonary:     Effort: Pulmonary effort is normal. No respiratory distress.     Breath sounds: Normal breath sounds.  Abdominal:     Palpations: Abdomen is soft.     Tenderness: There is no abdominal tenderness. There is no guarding or rebound.  Musculoskeletal:        General: No tenderness.  Skin:    General: Skin is warm and dry.  Neurological:     Mental Status: He is alert and oriented to person, place, and time.     Comments: EOMI.  No asymmetry of facial movement.  5/5 strength in all four extremities.    Psychiatric:        Behavior: Behavior normal.    ED Results / Procedures / Treatments   Labs (all labs ordered are listed, but only abnormal results are displayed) Labs Reviewed  BASIC METABOLIC PANEL - Abnormal; Notable for the following components:      Result Value   Sodium 134 (*)    Potassium 3.1 (*)    Glucose, Bld 103 (*)    Calcium 8.8 (*)    All other components within normal limits  CBC WITH DIFFERENTIAL/PLATELET - Abnormal; Notable for the following components:   Monocytes Absolute 1.3 (*)    All other components within normal limits  RESP PANEL BY RT-PCR (FLU A&B, COVID) [id number]    EKG EKG Interpretation  Date/Time:  Monday May 21 2021 05:57:32 EDT Ventricular Rate:  81 PR Interval:  162 QRS Duration: 102 QT Interval:  406 QTC Calculation: 471 R Axis:   86 Text  Interpretation: Normal sinus rhythm Possible Left atrial enlargement Left ventricular hypertrophy with repolarization abnormality ( Sokolow-Lyon , Cornell product , Romhilt-Estes ) Cannot rule out Septal infarct , age undetermined Abnormal ECG similar when compared to prior Confirmed by Tilden Fossa [phone]) on 05/21/2021 6:06:21 AM  Radiology CT Head Wo Contrast  Result Date: 05/21/2021 CLINICAL DATA:  45 year old male with history of headache, chills, fever and nasal congestion. EXAM: CT HEAD WITHOUT CONTRAST TECHNIQUE: Contiguous axial images were obtained from the base of the skull through the  vertex without intravenous contrast. COMPARISON:  No priors. FINDINGS: Brain: No evidence of acute infarction, hemorrhage, hydrocephalus, extra-axial collection or mass lesion/mass effect. Vascular: No hyperdense vessel or unexpected calcification. Skull: Normal. Negative for fracture or focal lesion. Sinuses/Orbits: No acute finding. Mild multifocal mucosal thickening in the paranasal sinuses with some small mucosal retention cysts or polyps in the maxillary sinuses bilaterally. Other: Fatty attenuation lesions in the left frontal and right occipital scalp, compatible with small lipomas. IMPRESSION: 1. No acute intracranial abnormalities. The appearance of the brain is normal. 2. Mild chronic paranasal sinus disease without signs of acute infection. Electronically Signed   By: Trudie Reed M.D.   On: 05/21/2021 05:14    Procedures Procedures   Medications Ordered in ED Medications  sodium chloride 0.9 % bolus 500 mL (0 mLs Intravenous Stopped 05/21/21 0630)  droperidol (INAPSINE) 2.5 MG/ML injection 1.25 mg (1.25 mg Intravenous Given 05/21/21 0638)  labetalol (NORMODYNE) injection 10 mg (10 mg Intravenous Given 05/21/21 0652)  metoCLOPramide (REGLAN) injection 10 mg (10 mg Intravenous Given 05/21/21 0653)  cloNIDine (CATAPRES) tablet 0.2 mg (0.2 mg Oral Given 05/21/21 7062)    ED Course  I have reviewed the  triage vital signs and the nursing notes.  Pertinent labs & imaging results that were available during my care of the patient were reviewed by me and considered in my medical decision making (see chart for details).    MDM Rules/Calculators/A&P                          Pt with hx/o HTN, cocaine use here for evaluation of two days of HA.  He is neurologically intact on evaluation and nontoxic appearing.  He is hypertensive on ED presentation.  Imaging negative for acute abnormality and BMP wnl.  Plan to treat HA and re-assess.    Presentation is not c/w SAH, dural sinus thrombosis, hypertensive urgency.  Pt reports improvement in HA on recheck. Plan to d/c home with outpatient resources and return precautions.     Final Clinical Impression(s) / ED Diagnoses Final diagnoses:  Bad headache    Rx / DC Orders ED Discharge Orders     None        Tilden Fossa, MD 05/21/21 [phone]

## 2021-05-21 NOTE — ED Triage Notes (Signed)
Pt reports headache, chills, fever, nasal congestion, and HTN for the past 2 days. Pt states that he has been taking his medications.

## 2022-04-04 ENCOUNTER — Emergency Department (HOSPITAL_COMMUNITY): Payer: Self-pay

## 2022-04-04 ENCOUNTER — Other Ambulatory Visit: Payer: Self-pay

## 2022-04-04 ENCOUNTER — Emergency Department (HOSPITAL_COMMUNITY)
Admission: EM | Admit: 2022-04-04 | Discharge: 2022-04-04 | Disposition: A | Discharge status: Home / Self Care | Payer: Self-pay | Attending: Emergency Medicine | Admitting: Emergency Medicine

## 2022-04-04 ENCOUNTER — Encounter (HOSPITAL_COMMUNITY): Payer: Self-pay

## 2022-04-04 DIAGNOSIS — I10 Essential (primary) hypertension: Secondary | ICD-10-CM | POA: Insufficient documentation

## 2022-04-04 DIAGNOSIS — F17200 Nicotine dependence, unspecified, uncomplicated: Secondary | ICD-10-CM | POA: Insufficient documentation

## 2022-04-04 DIAGNOSIS — J45909 Unspecified asthma, uncomplicated: Secondary | ICD-10-CM | POA: Insufficient documentation

## 2022-04-04 DIAGNOSIS — Z79899 Other long term (current) drug therapy: Secondary | ICD-10-CM | POA: Insufficient documentation

## 2022-04-04 DIAGNOSIS — R112 Nausea with vomiting, unspecified: Secondary | ICD-10-CM

## 2022-04-04 DIAGNOSIS — Z7951 Long term (current) use of inhaled steroids: Secondary | ICD-10-CM | POA: Insufficient documentation

## 2022-04-04 LAB — LIPASE, BLOOD: Lipase: 44 U/L (ref 11–51)

## 2022-04-04 LAB — CBC
HCT: 52.1 % — ABNORMAL HIGH (ref 39.0–52.0)
Hemoglobin: 17.6 g/dL — ABNORMAL HIGH (ref 13.0–17.0)
MCH: 30.5 pg (ref 26.0–34.0)
MCHC: 33.8 g/dL (ref 30.0–36.0)
MCV: 90.3 fL (ref 80.0–100.0)
Platelets: 337 10*3/uL (ref 150–400)
RBC: 5.77 MIL/uL (ref 4.22–5.81)
RDW: 13.8 % (ref 11.5–15.5)
WBC: 5.4 10*3/uL (ref 4.0–10.5)
nRBC: 0 % (ref 0.0–0.2)

## 2022-04-04 LAB — COMPREHENSIVE METABOLIC PANEL
ALT: 19 U/L (ref 0–44)
AST: 31 U/L (ref 15–41)
Albumin: 4.6 g/dL (ref 3.5–5.0)
Alkaline Phosphatase: 79 U/L (ref 38–126)
Anion gap: 11 (ref 5–15)
BUN: 14 mg/dL (ref 6–20)
CO2: 18 mmol/L — ABNORMAL LOW (ref 22–32)
Calcium: 9.5 mg/dL (ref 8.9–10.3)
Chloride: 108 mmol/L (ref 98–111)
Creatinine, Ser: 1.25 mg/dL — ABNORMAL HIGH (ref 0.61–1.24)
GFR, Estimated: >60 mL/min (ref >60)
Glucose, Bld: 128 mg/dL — ABNORMAL HIGH (ref 70–99)
Potassium: 4.2 mmol/L (ref 3.5–5.1)
Sodium: 137 mmol/L (ref 135–145)
Total Bilirubin: 1.3 mg/dL — ABNORMAL HIGH (ref 0.3–1.2)
Total Protein: 8.8 g/dL — ABNORMAL HIGH (ref 6.5–8.1)

## 2022-04-04 LAB — BRAIN NATRIURETIC PEPTIDE: B Natriuretic Peptide: 204.6 pg/mL — ABNORMAL HIGH (ref 0.0–100.0)

## 2022-04-04 LAB — TROPONIN I (HIGH SENSITIVITY)
Troponin I (High Sensitivity): 18 ng/L — ABNORMAL HIGH (ref <18)
Troponin I (High Sensitivity): 19 ng/L — ABNORMAL HIGH (ref <18)

## 2022-04-04 MED ORDER — CLONIDINE HCL 0.1 MG PO TABS
0.2000 mg | ORAL_TABLET | Freq: Once | ORAL | Status: AC
  Administered 2022-04-04: 0.2 mg via ORAL
  Filled 2022-04-04: qty 2

## 2022-04-04 MED ORDER — ONDANSETRON 8 MG PO TBDP: 8.0000 mg | ORAL_TABLET | Freq: Three times a day (TID) | ORAL | 0 refills | Status: AC | PRN

## 2022-04-04 MED ORDER — ONDANSETRON HCL 4 MG/2ML IJ SOLN
4.0000 mg | Freq: Once | INTRAMUSCULAR | Status: DC
  Filled 2022-04-04: qty 2

## 2022-04-04 NOTE — ED Provider Notes (Signed)
[facility]-EMERGENCY DEPT Provider Note   CSN: [id number] Arrival date & time: 04/04/22  0744     History  Chief Complaint  Patient presents with   Emesis   Shortness of Breath    Stephen West is a 46 y.o. male.   Patient has history of hypertension, chronic abdominal pain, hyperlipidemia, tobacco use, AKI, hypertensive urgency.  Patient presents ED with complaints of feeling short of breath this morning.  He also had 1 episode of diarrhea and has had some nausea and one episode of vomiting.  He has some discomfort in his upper abdomen.  He denies any cough.  No sore throat.  No fevers or chills.  Patient denies any history of chronic lung or cardiac problems although medical records do indicate history of asthma after use  Home Medications Prior to Admission medications   Medication Sig Start Date End Date Taking? Authorizing Provider  amLODipine (NORVASC) 10 MG tablet Take 1 tablet (10 mg total) by mouth daily. 01/21/21 04/04/22 Yes Darlin Drop, DO  gabapentin (NEURONTIN) 300 MG capsule Take 600 mg by mouth in the morning and at bedtime. 11/30/21  Yes [provider]  isosorbide mononitrate (IMDUR) 60 MG 24 hr tablet Take 60 mg by mouth daily. 02/26/22  Yes [provider]  ondansetron (ZOFRAN-ODT) 8 MG disintegrating tablet Take 1 tablet (8 mg total) by mouth every 8 (eight) hours as needed for nausea or vomiting. 04/04/22  Yes Linwood Dibbles, MD  pravastatin (PRAVACHOL) 20 MG tablet Take 20 mg by mouth daily. 02/26/22  Yes [provider]  carvedilol (COREG) 25 MG tablet Take 1 tablet (25 mg total) by mouth 2 (two) times daily. 01/21/21 04/21/21  Darlin Drop, DO  cloNIDine (CATAPRES) 0.2 MG tablet Take 1 tablet (0.2 mg total) by mouth 2 (two) times daily. Patient not taking: Reported on 04/04/2022 01/21/21 04/04/22  Darlin Drop, DO  hydrALAZINE (APRESOLINE) 10 MG tablet Take 1 tablet (10 mg total) by mouth 2 (two) times daily. 01/21/21 04/21/21   Darlin Drop, DO  polyethylene glycol (MIRALAX) 17 g packet Take 17 g by mouth daily. 01/21/21   Darlin Drop, DO  SYMBICORT 80-4.5 MCG/ACT inhaler Inhale 1 puff into the lungs in the morning and at bedtime. 08/04/20   [provider]      Allergies    Breo ellipta [fluticasone furoate-vilanterol], Carrot [daucus carota], and Banana    Review of Systems   Review of Systems  Constitutional:  Negative for fever.  Respiratory:  Positive for shortness of breath.   Gastrointestinal:  Positive for abdominal pain and diarrhea.    Physical Exam Updated Vital Signs BP (!) 132/92 (BP Location: Left Arm)   Pulse 86   Temp 97.9 F (36.6 C) (Oral)   Resp 18   Ht 1.803 m (5\' 11" )   Wt 99.8 kg   SpO2 98%   BMI 30.68 kg/m  Physical Exam Vitals and nursing note reviewed.  Constitutional:      General: He is not in acute distress.    Appearance: He is well-developed.  HENT:     Head: Normocephalic and atraumatic.     Right Ear: External ear normal.     Left Ear: External ear normal.  Eyes:     General: No scleral icterus.       Right eye: No discharge.        Left eye: No discharge.     Conjunctiva/sclera: Conjunctivae normal.  Neck:  Trachea: No tracheal deviation.  Cardiovascular:     Rate and Rhythm: Normal rate and regular rhythm.  Pulmonary:     Effort: Pulmonary effort is normal. No respiratory distress.     Breath sounds: Normal breath sounds. No stridor. No wheezing or rales.  Abdominal:     General: Bowel sounds are normal. There is no distension.     Palpations: Abdomen is soft.     Tenderness: There is no abdominal tenderness. There is no guarding or rebound.  Musculoskeletal:        General: No tenderness or deformity.     Cervical back: Neck supple.  Skin:    General: Skin is warm and dry.     Findings: No rash.  Neurological:     General: No focal deficit present.     Mental Status: He is alert.     Cranial Nerves: No cranial nerve deficit (no  facial droop, extraocular movements intact, no slurred speech).     Sensory: No sensory deficit.     Motor: No abnormal muscle tone or seizure activity.     Coordination: Coordination normal.  Psychiatric:        Mood and Affect: Mood normal.     ED Results / Procedures / Treatments   Labs (all labs ordered are listed, but only abnormal results are displayed) Labs Reviewed  CBC - Abnormal; Notable for the following components:      Result Value   Hemoglobin 17.6 (*)    HCT 52.1 (*)    All other components within normal limits  BRAIN NATRIURETIC PEPTIDE - Abnormal; Notable for the following components:   B Natriuretic Peptide 204.6 (*)    All other components within normal limits  COMPREHENSIVE METABOLIC PANEL - Abnormal; Notable for the following components:   CO2 18 (*)    Glucose, Bld 128 (*)    Creatinine, Ser 1.25 (*)    Total Protein 8.8 (*)    Total Bilirubin 1.3 (*)    All other components within normal limits  TROPONIN I (HIGH SENSITIVITY) - Abnormal; Notable for the following components:   Troponin I (High Sensitivity) 18 (*)    All other components within normal limits  TROPONIN I (HIGH SENSITIVITY) - Abnormal; Notable for the following components:   Troponin I (High Sensitivity) 19 (*)    All other components within normal limits  LIPASE, BLOOD    EKG EKG Interpretation  Date/Time:  Thursday April 04 2022 07:56:14 EDT Ventricular Rate:  94 PR Interval:  153 QRS Duration: 99 QT Interval:  355 QTC Calculation: 444 R Axis:   86 Text Interpretation: Sinus rhythm Left atrial enlargement LVH with secondary repolarization abnormality Anterior infarct, old Baseline wander in lead(s) II No significant change since last tracing Confirmed by Linwood Dibbles [phone]) on 04/04/2022 8:14:24 AM  Radiology US Abdomen Limited  Result Date: 04/04/2022 CLINICAL DATA:  Upper abdominal pain and vomiting over the last day. EXAM: ULTRASOUND ABDOMEN LIMITED RIGHT UPPER QUADRANT  COMPARISON:  CT June of 2021. FINDINGS: Gallbladder: No gallstones or wall thickening visualized. No sonographic Murphy sign noted by sonographer. Common bile duct: Diameter: 2.5 mm common normal Liver: No focal lesion identified. Within normal limits in parenchymal echogenicity. Portal vein is patent on color Doppler imaging with normal direction of blood flow towards the liver. Other: None. IMPRESSION: Normal right upper quadrant ultrasound. Electronically Signed   By: Paulina Fusi M.D.   On: 04/04/2022 12:38   DG Chest 2 View  Result Date:  04/04/2022 CLINICAL DATA:  Shortness of breath. EXAM: CHEST - 2 VIEW COMPARISON:  Chest radiographs 04/18/2020 and CTA 01/19/2021 FINDINGS: Prior sternotomy is noted. The cardiomediastinal silhouette is unchanged with normal heart size. The lungs are well inflated and clear. No pleural effusion or pneumothorax is identified. No acute osseous abnormality is seen. IMPRESSION: No active cardiopulmonary disease. Electronically Signed   By: Sebastian Ache M.D.   On: 04/04/2022 08:19    Procedures Procedures    Medications Ordered in ED Medications  ondansetron (ZOFRAN) injection 4 mg (4 mg Intravenous Patient Refused/Not Given 04/04/22 0852)  cloNIDine (CATAPRES) tablet 0.2 mg (0.2 mg Oral Given 04/04/22 0835)    ED Course/ Medical Decision Making/ A&P Clinical Course as of 04/04/22 1250  Thu Apr 04, 2022  0934 Comprehensive metabolic panel(!) Creatinine elevated bicarb decreased [JK]  0934 Lipase, blood Normal [JK]  0934 Troponin I (High Sensitivity)(!) Troponin elevated but similar compared to previous values [JK]  0934 Brain natriuretic peptide(!) Slightly increased compared to previous values [JK]  1215 Troponin I (High Sensitivity)(!) Delta troponin stable [JK]  1241 Ultrasound report reviewed.  No signs of acute cholecystitis [JK]    Clinical Course User Index [JK] Linwood Dibbles, MD                           Medical Decision Making Problems  Addressed: Nausea and vomiting, unspecified vomiting type: acute illness or injury Uncontrolled hypertension: acute illness or injury that poses a threat to life or bodily functions  Amount and/or Complexity of Data Reviewed External Data Reviewed: notes.    Details: Reviewed notes from patient's outpatient visit at [facility]: ordered. Decision-making details documented in ED Course. Radiology: ordered and independent interpretation performed. Decision-making details documented in ED Course.  Risk Prescription drug management. Decision regarding hospitalization.   Patient presented to ED with complaints of nausea shortness of breath and noted to be significantly hypertensive.  Patient does have history of multiple medical problems including poorly controlled blood pressure.  Concerned about the possibility of CHF exacerbation, acute coronary syndrome, hypertensive urgency, as well as GI etiology such as biliary colic.  ED work-up overall reassuring.  Abdominal exam is benign ultrasound does not show any signs of cholecystitis or choledocholithiasis.  Troponin slightly elevated but is stable compared to baseline.  Doubt ACS at this time.  Patient also has slightly elevated BNP but no significant signs of pulmonary edema and patient not having any respiratory difficulty.  I doubt acute CHF exacerbation.  Patient was significantly hypertensive.  I did give him a dose of his oral blood pressure medications his blood pressure is improved and now 132/92.  At this time I think he is improved significantly and does not require hospitalization.  Evaluation and diagnostic testing in the emergency department does not suggest an emergent condition requiring admission or immediate intervention beyond what has been performed at this time.  The patient is safe for discharge and has been instructed to return immediately for worsening symptoms, change in symptoms or any other  concerns.        Final Clinical Impression(s) / ED Diagnoses Final diagnoses:  Nausea and vomiting, unspecified vomiting type  Uncontrolled hypertension    Rx / DC Orders ED Discharge Orders          Ordered    ondansetron (ZOFRAN-ODT) 8 MG disintegrating tablet  Every 8 hours PRN        04/04/22 1246  Linwood Dibbles, MD 04/04/22 1250

## 2022-04-04 NOTE — Discharge Instructions (Signed)
Make sure to take your blood pressure medications regularly.  Follow-up with your primary care doctor next week to have that rechecked  Return to the ED as needed for recurrent, worsening symptoms

## 2022-04-04 NOTE — ED Triage Notes (Signed)
Patient c/o SOB that started this AM and states that it is worse when he walks. Patient also c/o abdominal pain, N/v/D that started this AM.
